# Patient Record
Sex: Female | Born: 1995
Health system: Southern US, Community
[De-identification: ages and names within clinical notes are randomized; demographics above are authoritative.]

## PROBLEM LIST (undated history)

## (undated) ENCOUNTER — Inpatient Hospital Stay (HOSPITAL_COMMUNITY): Payer: Self-pay

## (undated) DIAGNOSIS — Z62820 Parent-biological child conflict: Secondary | ICD-10-CM

## (undated) DIAGNOSIS — B349 Viral infection, unspecified: Secondary | ICD-10-CM

## (undated) DIAGNOSIS — A749 Chlamydial infection, unspecified: Secondary | ICD-10-CM

## (undated) DIAGNOSIS — N390 Urinary tract infection, site not specified: Secondary | ICD-10-CM

## (undated) DIAGNOSIS — J309 Allergic rhinitis, unspecified: Secondary | ICD-10-CM

## (undated) DIAGNOSIS — G43829 Menstrual migraine, not intractable, without status migrainosus: Secondary | ICD-10-CM

## (undated) DIAGNOSIS — F418 Other specified anxiety disorders: Secondary | ICD-10-CM

## (undated) DIAGNOSIS — F331 Major depressive disorder, recurrent, moderate: Secondary | ICD-10-CM

## (undated) DIAGNOSIS — D729 Disorder of white blood cells, unspecified: Secondary | ICD-10-CM

## (undated) DIAGNOSIS — R5383 Other fatigue: Secondary | ICD-10-CM

## (undated) DIAGNOSIS — E559 Vitamin D deficiency, unspecified: Secondary | ICD-10-CM

## (undated) DIAGNOSIS — F320 Major depressive disorder, single episode, mild: Secondary | ICD-10-CM

## (undated) DIAGNOSIS — F419 Anxiety disorder, unspecified: Secondary | ICD-10-CM

## (undated) DIAGNOSIS — D649 Anemia, unspecified: Secondary | ICD-10-CM

## (undated) DIAGNOSIS — J209 Acute bronchitis, unspecified: Secondary | ICD-10-CM

## (undated) DIAGNOSIS — R000 Tachycardia, unspecified: Secondary | ICD-10-CM

## (undated) DIAGNOSIS — R002 Palpitations: Secondary | ICD-10-CM

## (undated) DIAGNOSIS — G4700 Insomnia, unspecified: Secondary | ICD-10-CM

## (undated) DIAGNOSIS — R040 Epistaxis: Secondary | ICD-10-CM

## (undated) DIAGNOSIS — E162 Hypoglycemia, unspecified: Secondary | ICD-10-CM

## (undated) DIAGNOSIS — Z1331 Encounter for screening for depression: Secondary | ICD-10-CM

## (undated) DIAGNOSIS — K589 Irritable bowel syndrome without diarrhea: Secondary | ICD-10-CM

## (undated) DIAGNOSIS — F4001 Agoraphobia with panic disorder: Secondary | ICD-10-CM

## (undated) DIAGNOSIS — T50995A Adverse effect of other drugs, medicaments and biological substances, initial encounter: Secondary | ICD-10-CM

## (undated) DIAGNOSIS — G43909 Migraine, unspecified, not intractable, without status migrainosus: Secondary | ICD-10-CM

## (undated) DIAGNOSIS — J101 Influenza due to other identified influenza virus with other respiratory manifestations: Secondary | ICD-10-CM

## (undated) DIAGNOSIS — K219 Gastro-esophageal reflux disease without esophagitis: Secondary | ICD-10-CM

## (undated) HISTORY — DX: Insomnia, unspecified: G47.00

## (undated) HISTORY — DX: Viral infection, unspecified: B34.9

## (undated) HISTORY — DX: Menstrual migraine, not intractable, without status migrainosus: G43.829

## (undated) HISTORY — DX: Major depressive disorder, single episode, mild: F32.0

## (undated) HISTORY — DX: Urinary tract infection, site not specified: N39.0

## (undated) HISTORY — DX: Parent-biological child conflict: Z62.820

## (undated) HISTORY — DX: Other specified anxiety disorders: F41.8

## (undated) HISTORY — DX: Other fatigue: R53.83

## (undated) HISTORY — DX: Hypoglycemia, unspecified: E16.2

## (undated) HISTORY — DX: Anemia, unspecified: D64.9

## (undated) HISTORY — DX: Acute bronchitis, unspecified: J20.9

## (undated) HISTORY — DX: Disorder of white blood cells, unspecified: D72.9

## (undated) HISTORY — DX: Vitamin D deficiency, unspecified: E55.9

## (undated) HISTORY — DX: Adverse effect of other drugs, medicaments and biological substances, initial encounter: T50.995A

## (undated) HISTORY — DX: Allergic rhinitis, unspecified: J30.9

## (undated) HISTORY — DX: Chlamydial infection, unspecified: A74.9

## (undated) HISTORY — DX: Irritable bowel syndrome, unspecified: K58.9

## (undated) HISTORY — DX: Epistaxis: R04.0

## (undated) HISTORY — DX: Palpitations: R00.2

## (undated) HISTORY — DX: Agoraphobia with panic disorder: F40.01

## (undated) HISTORY — DX: Migraine, unspecified, not intractable, without status migrainosus: G43.909

## (undated) HISTORY — DX: Gastro-esophageal reflux disease without esophagitis: K21.9

## (undated) HISTORY — DX: Major depressive disorder, recurrent, moderate: F33.1

## (undated) HISTORY — PX: EYE SURGERY: SHX253

## (undated) HISTORY — DX: Anxiety disorder, unspecified: F41.9

## (undated) HISTORY — DX: Encounter for screening for depression: Z13.31

## (undated) HISTORY — DX: Influenza due to other identified influenza virus with other respiratory manifestations: J10.1

## (undated) HISTORY — PX: BRAIN SURGERY: SHX531

---
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
## 2014-01-06 ENCOUNTER — Ambulatory Visit: Payer: Self-pay | Admitting: Neurology

## 2014-04-18 ENCOUNTER — Ambulatory Visit: Payer: Self-pay | Admitting: Physician Assistant

## 2014-04-18 LAB — URINALYSIS, COMPLETE
Bacteria: NEGATIVE
Bilirubin,UR: NEGATIVE
Blood: NEGATIVE
Glucose,UR: NEGATIVE mg/dL (ref 0–75)
Ketone: NEGATIVE
Leukocyte Esterase: NEGATIVE
Nitrite: NEGATIVE
Ph: 8 (ref 4.5–8.0)
RBC,UR: NONE SEEN /HPF (ref 0–5)
Specific Gravity: 1.01 (ref 1.003–1.030)
WBC UR: NONE SEEN /HPF (ref 0–5)

## 2014-04-18 LAB — PREGNANCY, URINE: PREGNANCY TEST, URINE: NEGATIVE m[IU]/mL

## 2014-11-11 DIAGNOSIS — A749 Chlamydial infection, unspecified: Secondary | ICD-10-CM

## 2014-11-11 HISTORY — DX: Chlamydial infection, unspecified: A74.9

## 2015-02-20 ENCOUNTER — Other Ambulatory Visit: Payer: Self-pay

## 2015-02-20 DIAGNOSIS — F4001 Agoraphobia with panic disorder: Secondary | ICD-10-CM | POA: Insufficient documentation

## 2015-02-20 DIAGNOSIS — Z111 Encounter for screening for respiratory tuberculosis: Secondary | ICD-10-CM | POA: Insufficient documentation

## 2015-02-20 DIAGNOSIS — F329 Major depressive disorder, single episode, unspecified: Secondary | ICD-10-CM | POA: Insufficient documentation

## 2015-02-20 DIAGNOSIS — J101 Influenza due to other identified influenza virus with other respiratory manifestations: Secondary | ICD-10-CM | POA: Insufficient documentation

## 2015-02-20 DIAGNOSIS — T50995A Adverse effect of other drugs, medicaments and biological substances, initial encounter: Secondary | ICD-10-CM | POA: Insufficient documentation

## 2015-02-20 DIAGNOSIS — Z9229 Personal history of other drug therapy: Secondary | ICD-10-CM | POA: Insufficient documentation

## 2015-02-20 DIAGNOSIS — R040 Epistaxis: Secondary | ICD-10-CM | POA: Insufficient documentation

## 2015-02-20 DIAGNOSIS — R011 Cardiac murmur, unspecified: Secondary | ICD-10-CM | POA: Insufficient documentation

## 2015-02-20 DIAGNOSIS — Z23 Encounter for immunization: Secondary | ICD-10-CM | POA: Insufficient documentation

## 2015-02-20 DIAGNOSIS — G43909 Migraine, unspecified, not intractable, without status migrainosus: Secondary | ICD-10-CM | POA: Insufficient documentation

## 2015-02-20 DIAGNOSIS — Z7189 Other specified counseling: Secondary | ICD-10-CM | POA: Insufficient documentation

## 2015-02-20 DIAGNOSIS — B349 Viral infection, unspecified: Secondary | ICD-10-CM | POA: Insufficient documentation

## 2015-02-20 DIAGNOSIS — R251 Tremor, unspecified: Secondary | ICD-10-CM | POA: Insufficient documentation

## 2015-02-20 DIAGNOSIS — K589 Irritable bowel syndrome without diarrhea: Secondary | ICD-10-CM | POA: Insufficient documentation

## 2015-02-20 DIAGNOSIS — J209 Acute bronchitis, unspecified: Secondary | ICD-10-CM | POA: Insufficient documentation

## 2015-02-20 DIAGNOSIS — Z1331 Encounter for screening for depression: Secondary | ICD-10-CM | POA: Insufficient documentation

## 2015-02-20 DIAGNOSIS — J309 Allergic rhinitis, unspecified: Secondary | ICD-10-CM | POA: Insufficient documentation

## 2015-02-20 DIAGNOSIS — Z0000 Encounter for general adult medical examination without abnormal findings: Secondary | ICD-10-CM | POA: Insufficient documentation

## 2015-02-20 DIAGNOSIS — N390 Urinary tract infection, site not specified: Secondary | ICD-10-CM | POA: Insufficient documentation

## 2015-02-20 DIAGNOSIS — F331 Major depressive disorder, recurrent, moderate: Secondary | ICD-10-CM | POA: Insufficient documentation

## 2015-02-20 DIAGNOSIS — E162 Hypoglycemia, unspecified: Secondary | ICD-10-CM | POA: Insufficient documentation

## 2015-02-20 DIAGNOSIS — K219 Gastro-esophageal reflux disease without esophagitis: Secondary | ICD-10-CM | POA: Insufficient documentation

## 2015-02-20 DIAGNOSIS — G4700 Insomnia, unspecified: Secondary | ICD-10-CM | POA: Insufficient documentation

## 2015-02-20 DIAGNOSIS — R509 Fever, unspecified: Secondary | ICD-10-CM | POA: Insufficient documentation

## 2015-02-20 DIAGNOSIS — Z3009 Encounter for other general counseling and advice on contraception: Secondary | ICD-10-CM | POA: Insufficient documentation

## 2015-02-20 DIAGNOSIS — R5383 Other fatigue: Secondary | ICD-10-CM | POA: Insufficient documentation

## 2015-02-20 DIAGNOSIS — G43829 Menstrual migraine, not intractable, without status migrainosus: Secondary | ICD-10-CM | POA: Insufficient documentation

## 2015-02-20 DIAGNOSIS — Z8679 Personal history of other diseases of the circulatory system: Secondary | ICD-10-CM | POA: Insufficient documentation

## 2015-02-20 DIAGNOSIS — F320 Major depressive disorder, single episode, mild: Secondary | ICD-10-CM | POA: Insufficient documentation

## 2015-02-20 DIAGNOSIS — F32A Depression, unspecified: Secondary | ICD-10-CM | POA: Insufficient documentation

## 2015-02-20 DIAGNOSIS — F419 Anxiety disorder, unspecified: Secondary | ICD-10-CM | POA: Insufficient documentation

## 2015-02-20 DIAGNOSIS — R002 Palpitations: Secondary | ICD-10-CM | POA: Insufficient documentation

## 2015-02-20 DIAGNOSIS — D729 Disorder of white blood cells, unspecified: Secondary | ICD-10-CM | POA: Insufficient documentation

## 2015-04-13 ENCOUNTER — Telehealth: Payer: Self-pay | Admitting: Psychiatry

## 2015-05-26 ENCOUNTER — Encounter: Payer: Self-pay | Admitting: Family Medicine

## 2015-05-26 ENCOUNTER — Ambulatory Visit (INDEPENDENT_AMBULATORY_CARE_PROVIDER_SITE_OTHER): Payer: BLUE CROSS/BLUE SHIELD | Admitting: Family Medicine

## 2015-05-26 ENCOUNTER — Other Ambulatory Visit: Payer: Self-pay

## 2015-05-26 VITALS — BP 108/76 | HR 76 | Temp 97.8°F | Resp 20 | Ht 64.0 in | Wt 189.0 lb

## 2015-05-26 DIAGNOSIS — T50995D Adverse effect of other drugs, medicaments and biological substances, subsequent encounter: Secondary | ICD-10-CM

## 2015-05-26 DIAGNOSIS — F331 Major depressive disorder, recurrent, moderate: Secondary | ICD-10-CM

## 2015-05-26 DIAGNOSIS — H60543 Acute eczematoid otitis externa, bilateral: Secondary | ICD-10-CM | POA: Diagnosis not present

## 2015-05-26 DIAGNOSIS — F419 Anxiety disorder, unspecified: Secondary | ICD-10-CM | POA: Diagnosis not present

## 2015-05-26 MED ORDER — CITALOPRAM HYDROBROMIDE 20 MG PO TABS: 20.0000 mg | ORAL_TABLET | Freq: Every evening | ORAL | Status: DC

## 2015-05-26 MED ORDER — MOMETASONE FUROATE 0.1 % EX CREA: 1.0000 "application " | TOPICAL_CREAM | Freq: Every day | CUTANEOUS | Status: DC

## 2015-05-26 MED ORDER — ALPRAZOLAM 0.25 MG PO TABS: 0.2500 mg | ORAL_TABLET | ORAL | Status: DC | PRN

## 2015-05-26 MED ORDER — EPINEPHRINE 0.3 MG/0.3ML IJ SOAJ
0.3000 mg | Freq: Every day | INTRAMUSCULAR | Status: DC
Start: 2015-05-26 — End: 2016-03-06

## 2015-05-26 NOTE — Progress Notes (Signed)
Subjective:    Patient ID: Lisa Grimes, female    DOB: 02/11/1996, 19 y.o.   MRN: [id number]  Anxiety Presents for follow-up visit. Symptoms include chest pain, confusion, decreased concentration, depressed mood, irritability, malaise, muscle tension, nervous/anxious behavior, palpitations, panic, restlessness and shortness of breath. Patient reports no compulsions, dizziness, dry mouth, excessive worry, feeling of choking, hyperventilation, impotence, insomnia, nausea, obsessions or suicidal ideas. Symptoms occur most days. The severity of symptoms is moderate. The symptoms are aggravated by family issues. The patient sleeps 8 hours per night. The quality of sleep is non-restorative. Nighttime awakenings: one to two.   Past treatments include counseling (CBT) (Pt's copay is currently to high to afford her psychiatrist. Pt is currently taking Xanax, and has run out of Celexa rx.). The treatment provided moderate relief.  Allergic Reaction Associated symptoms include chest pain. Pertinent negatives include no hyperventilation.  Pt needs epi-pen refilled for red-dye allergy.  Patient having mild, chronic itching of the opening of both EACs.   Review of Systems  Constitutional: Positive for irritability.  HENT: Negative.   Eyes: Negative.   Respiratory: Positive for shortness of breath.   Cardiovascular: Positive for chest pain and palpitations.  Gastrointestinal: Negative for nausea.  Endocrine: Negative.   Genitourinary: Negative.  Negative for impotence.  Allergic/Immunologic: Negative.   Neurological: Negative for dizziness.  Hematological: Negative.   Psychiatric/Behavioral: Positive for confusion and decreased concentration. Negative for suicidal ideas. The patient is nervous/anxious. The patient does not have insomnia.    BP 108/76 mmHg  Pulse 76  Temp(Src) 97.8 F (36.6 C) (Oral)  Resp 20  Ht 5\' 4"  (1.626 m)  Wt 189 lb (85.73 kg)  BMI 32.43 kg/m2  LMP 04/28/2015  (Approximate)   Patient Active Problem List   Diagnosis Date Noted  . Abnormal WBC count 02/20/2015  . Allergic reaction to dye 02/20/2015  . Allergic rhinitis 02/20/2015  . Routine general medical examination at a health care facility 02/20/2015  . Anxiety 02/20/2015  . Family planning 02/20/2015  . Acute bronchitis 02/20/2015  . Anxiety and depression 02/20/2015  . Bleeding from the nose 02/20/2015  . Fatigue 02/20/2015  . Fever of undetermined origin 02/20/2015  . Acid reflux 02/20/2015  . Headache, menstrual migraine 02/20/2015  . Cardiac murmur 02/20/2015  . H/O cardiovascular disorder 02/20/2015  . Cannot sleep 02/20/2015  . Depression, major, recurrent, moderate 02/20/2015  . Hypoglycemia 02/20/2015  . Adaptive colitis 02/20/2015  . Influenza due to influenza A virus 02/20/2015  . Received influenza vaccination at hospital 02/20/2015  . Need for vaccination for meningococcus 02/20/2015  . Headache, migraine 02/20/2015  . Mild major depression 02/20/2015  . Need for vaccination 02/20/2015  . Flu vaccine need 02/20/2015  . Intermittent tremor 02/20/2015  . Awareness of heartbeats 02/20/2015  . Disease caused by virus 02/20/2015  . Lower urinary tract infection 02/20/2015  . Screening for depression 02/20/2015  . Screening examination for pulmonary tuberculosis 02/20/2015  . Counseling for guardian-child conflict 02/20/2015  . Panic disorder with agoraphobia and severe panic attacks 02/20/2015   Past Medical History  Diagnosis Date  . Dye allergic reaction   . Rhinitis, allergic   . Abnormal white blood cell count   . Anxiety   . Depression with anxiety   . Epistaxis   . Influenza A   . Gastroesophageal reflux disease   . Screening for depression   . Hypoglycemia   . Insomnia   . Menstrual headache   . Acute bronchitis   .  Palpitations   . Migraine headache   . Mild major depression   . Viral syndrome   . Fatigue   . UTI (lower urinary tract infection)    . Major depressive disorder, recurrent episode, moderate with anxious distress   . Parent-child relational problem   . Panic disorder with agoraphobia and severe panic attacks    Current Outpatient Prescriptions on File Prior to Visit  Medication Sig  . citalopram (CELEXA) 20 MG tablet Take 1 tablet by mouth every evening.  Marland Kitchen EPINEPHrine 0.3 mg/0.3 mL IJ SOAJ injection Inject 1 Device as directed daily.   No current facility-administered medications on file prior to visit.   Allergies  Allergen Reactions  . Penicillins Hives  . Red Dye     possible allergy   No past surgical history on file. History   Social History  . Marital Status: Single    Spouse Name: N/A  . Number of Children: N/A  . Years of Education: N/A   Occupational History  . Not on file.   Social History Main Topics  . Smoking status: Never Smoker   . Smokeless tobacco: Never Used  . Alcohol Use: No  . Drug Use: No  . Sexual Activity: Not on file   Other Topics Concern  . Not on file   Social History Narrative   Family History  Problem Relation Age of Onset  . Depression Mother   . Anxiety disorder Mother   . Alcohol abuse Father   . Bipolar disorder Maternal Aunt   . Bipolar disorder Maternal Uncle   . Bipolar disorder Paternal Uncle   . Depression Maternal Grandmother   . Depression Paternal Grandmother   . Bipolar disorder Paternal Grandmother   . ADD / ADHD Cousin   . Depression Cousin        Objective:   Physical Exam  Constitutional: She is oriented to person, place, and time. She appears well-developed and well-nourished.  HENT:  Head: Normocephalic and atraumatic.  Right Ear: External ear normal.  Left Ear: External ear normal.  Nose: Nose normal.  Eyes: Conjunctivae are normal. Pupils are equal, round, and reactive to light.  Neck: Normal range of motion. Neck supple.  Cardiovascular: Normal rate, regular rhythm and normal heart sounds.   Pulmonary/Chest: Effort normal and  breath sounds normal.  Abdominal: Soft. Bowel sounds are normal.  Neurological: She is alert and oriented to person, place, and time.  Skin: Skin is warm and dry.  Psychiatric: She has a normal mood and affect. Her behavior is normal. Judgment and thought content normal.          Assessment & Plan:  1. Chronic anxiety Advised patient that I think the citalopram is more important to her than the alprazolam. Restarted this. Alprazolam when necessary. She will follow up with a different psychiatrist. I think this is important that do think she has chronic anxiety with some mild depression. 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  2. Chronic allergies EpiPen is refilled. 3 mild eczema of both external auditory canals. We'll treat with mometasone cream nightly for ran 2 with a couple refills. I need referral but I think this will be fine. 4. Chlamydia cervicitis found last year routine GYN exam. I commended her routine GYN follow-up along with contraception through GYN.  I have done the exam and reviewed the above chart and it is accurate to the best of my knowledge.

## 2015-07-24 ENCOUNTER — Ambulatory Visit (INDEPENDENT_AMBULATORY_CARE_PROVIDER_SITE_OTHER): Payer: BLUE CROSS/BLUE SHIELD | Admitting: Family Medicine

## 2015-07-24 ENCOUNTER — Encounter: Payer: Self-pay | Admitting: Family Medicine

## 2015-07-24 VITALS — BP 110/68 | HR 72 | Temp 98.1°F | Resp 16 | Wt 186.0 lb

## 2015-07-24 DIAGNOSIS — L03818 Cellulitis of other sites: Secondary | ICD-10-CM

## 2015-07-24 DIAGNOSIS — S93402A Sprain of unspecified ligament of left ankle, initial encounter: Secondary | ICD-10-CM

## 2015-07-24 DIAGNOSIS — L600 Ingrowing nail: Secondary | ICD-10-CM | POA: Diagnosis not present

## 2015-07-24 MED ORDER — SULFAMETHOXAZOLE-TRIMETHOPRIM 800-160 MG PO TABS: 1.0000 | ORAL_TABLET | Freq: Two times a day (BID) | ORAL | Status: DC

## 2015-07-24 NOTE — Progress Notes (Signed)
Patient ID: Lisa Grimes, female   DOB: 1996/09/17, 19 y.o.   MRN: [id number]    Subjective:  HPI   Ingrown toenail issue: Patient has a history of this that is recurrent but not as severe as she has it now. She usually will apply peroxide and try to cut the nail out but not this time.  Ankle/foot pain: Patient was walking and stepped in a hole in the ground and twisted her left foot outward. Slight swelling present and pain.  Prior to Admission medications   Medication Sig Start Date End Date Taking? Authorizing Provider  ALPRAZolam (XANAX) 0.25 MG tablet Take 1 tablet (0.25 mg total) by mouth as needed. 05/26/15  Yes Richard Hulen Shouts., MD  citalopram (CELEXA) 20 MG tablet Take 1 tablet (20 mg total) by mouth every evening. 05/26/15  Yes Richard Hulen Shouts., MD  EPINEPHrine 0.3 mg/0.3 mL IJ SOAJ injection Inject 0.3 mLs (0.3 mg total) into the muscle daily. 05/26/15  Yes Richard Hulen Shouts., MD  mometasone (ELOCON) 0.1 % cream Apply 1 application topically daily. Apply 1 dab at bedtime for 2 weeks 05/26/15  Yes Richard Hulen Shouts., MD    Patient Active Problem List   Diagnosis Date Noted  . Abnormal WBC count 02/20/2015  . Allergic reaction to dye 02/20/2015  . Allergic rhinitis 02/20/2015  . Routine general medical examination at a health care facility 02/20/2015  . Anxiety 02/20/2015  . Family planning 02/20/2015  . Acute bronchitis 02/20/2015  . Anxiety and depression 02/20/2015  . Bleeding from the nose 02/20/2015  . Fatigue 02/20/2015  . Fever of undetermined origin 02/20/2015  . Acid reflux 02/20/2015  . Headache, menstrual migraine 02/20/2015  . Cardiac murmur 02/20/2015  . H/O cardiovascular disorder 02/20/2015  . Cannot sleep 02/20/2015  . Depression, major, recurrent, moderate 02/20/2015  . Hypoglycemia 02/20/2015  . Adaptive colitis 02/20/2015  . Influenza due to influenza A virus 02/20/2015  . Received influenza vaccination at hospital 02/20/2015  . Need  for vaccination for meningococcus 02/20/2015  . Headache, migraine 02/20/2015  . Mild major depression 02/20/2015  . Need for vaccination 02/20/2015  . Flu vaccine need 02/20/2015  . Intermittent tremor 02/20/2015  . Awareness of heartbeats 02/20/2015  . Disease caused by virus 02/20/2015  . Lower urinary tract infection 02/20/2015  . Screening for depression 02/20/2015  . Screening examination for pulmonary tuberculosis 02/20/2015  . Counseling for guardian-child conflict 02/20/2015  . Panic disorder with agoraphobia and severe panic attacks 02/20/2015    Past Medical History  Diagnosis Date  . Dye allergic reaction   . Rhinitis, allergic   . Abnormal white blood cell count   . Anxiety   . Depression with anxiety   . Epistaxis   . Influenza A   . Gastroesophageal reflux disease   . Screening for depression   . Hypoglycemia   . Insomnia   . Menstrual headache   . Acute bronchitis   . Palpitations   . Migraine headache   . Mild major depression   . Viral syndrome   . Fatigue   . UTI (lower urinary tract infection)   . Major depressive disorder, recurrent episode, moderate with anxious distress   . Parent-child relational problem   . Panic disorder with agoraphobia and severe panic attacks     Social History   Social History  . Marital Status: Single    Spouse Name: N/A  . Number of Children: N/A  . Years of  Education: N/A   Occupational History  . Not on file.   Social History Main Topics  . Smoking status: Never Smoker   . Smokeless tobacco: Never Used  . Alcohol Use: No  . Drug Use: No  . Sexual Activity: Not on file   Other Topics Concern  . Not on file   Social History Narrative    Allergies  Allergen Reactions  . Penicillins Hives  . Red Dye     possible allergy    Review of Systems  Constitutional: Negative.   HENT: Negative.   Eyes: Negative.   Respiratory: Negative.   Cardiovascular: Negative.   Gastrointestinal: Negative.     Musculoskeletal: Positive for joint pain.  Skin: Negative.   Neurological: Negative.   Psychiatric/Behavioral: Negative.      There is no immunization history on file for this patient. Objective:  BP 110/68 mmHg  Pulse 72  Temp(Src) 98.1 F (36.7 C)  Resp 16  Wt 186 lb (84.369 kg)  Physical Exam  Constitutional: She is oriented to person, place, and time and well-developed, well-nourished, and in no distress.  HENT:  Head: Normocephalic and atraumatic.  Right Ear: External ear normal.  Left Ear: External ear normal.  Nose: Nose normal.  Eyes: Conjunctivae are normal.  Neck: Neck supple.  Cardiovascular: Normal rate, regular rhythm and normal heart sounds.   Pulmonary/Chest: Effort normal and breath sounds normal.  Abdominal: Soft.  Musculoskeletal:  Mild swelling of left lateral ankle/laterlforefoot. Ankle/foot stable with minimal tenderness.  Neurological: She is alert and oriented to person, place, and time.  Skin: Skin is warm and dry.  Psychiatric: Mood, memory, affect and judgment normal.    Lab Results  Component Value Date   WBC 6.0 12/23/2013   HGB 13.1 12/23/2013   HCT 39.2 12/23/2013   PLT 152 12/23/2013   GLUCOSE 126* 12/23/2013    CMP     Component Value Date/Time   NA 138 12/23/2013 2222   K 3.7 12/23/2013 2222   CL 106 12/23/2013 2222   CO2 28* 12/23/2013 2222   GLUCOSE 126* 12/23/2013 2222   BUN 11 12/23/2013 2222   CREATININE 0.74 12/23/2013 2222   CALCIUM 8.8* 12/23/2013 2222   GFRNONAA >60 12/23/2013 2222   GFRAA >60 12/23/2013 2222    Assessment and Plan :  1. Ingrown toenail  Refer to podiatry at pt request of Antony Contras here at [facility] can remove nail when no longer infected. - sulfamethoxazole-trimethoprim (BACTRIM DS,SEPTRA DS) 800-160 MG per tablet; Take 1 tablet by mouth 2 (two) times daily.  Dispense: 14 tablet; Refill: 0  2. Cellulitis of other specified site   3. Ankle sprain, left, initial encounter 1st degree. No xray needed  unless pain increase. Minimal swelling today and normal exam. Note written for work for pt.RICE discussedn at length. Happy to refer to podiatry. Does not need boot or to avoid all weight bearing. 4.Chronic Anxiety  Julieanne Manson MD [facility] 07/24/2015 1:41 PM

## 2015-07-24 NOTE — Patient Instructions (Signed)
Use Ibuprofen 400 mg three times daily as needed for ankle pain and swelling. Apply ice four times daily as needed. Elevate foot while icing. Use an ace wrap as instructed for 1 week.

## 2015-08-02 ENCOUNTER — Encounter: Payer: Self-pay | Admitting: Physician Assistant

## 2015-08-02 ENCOUNTER — Other Ambulatory Visit: Payer: Self-pay

## 2015-08-02 ENCOUNTER — Ambulatory Visit (INDEPENDENT_AMBULATORY_CARE_PROVIDER_SITE_OTHER): Payer: BLUE CROSS/BLUE SHIELD | Admitting: Physician Assistant

## 2015-08-02 VITALS — BP 112/60 | HR 70 | Temp 98.1°F | Resp 16 | Wt 185.8 lb

## 2015-08-02 DIAGNOSIS — L600 Ingrowing nail: Secondary | ICD-10-CM

## 2015-08-02 MED ORDER — SULFAMETHOXAZOLE-TRIMETHOPRIM 800-160 MG PO TABS: 1.0000 | ORAL_TABLET | Freq: Two times a day (BID) | ORAL | Status: DC

## 2015-08-02 NOTE — Progress Notes (Signed)
   Subjective:    Patient ID: Lisa Grimes, female    DOB: 10/12/1996, 19 y.o.   MRN: [id number]  HPI Lisa Grimes is a 19 year old female that comes to the office today for removal of an ingrown toenail. She was previously seen last week by Dr. Sullivan Lone and was started on Bactrim for the ingrown toenail infection. She states that it has improved some but it is still tender. She is still having issues and pain with walking. 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 This causes her pain and increases the risk for further infections. She has tolerated the antibiotic well. There is still some redness to the left great toe lateral aspect. There is no drainage today. She denies any fevers, chills, nausea or vomiting.   Review of Systems  Constitutional: Negative.   Respiratory: Negative.   Cardiovascular: Negative.   Gastrointestinal: Negative.   Musculoskeletal: Negative.   Skin: Positive for wound (infected L great toe ingrown nail).  Neurological: Negative.   Hematological: Negative.        Objective:   Physical Exam  Constitutional: She appears well-developed and well-nourished. No distress.  Skin: Skin is warm and dry. No rash noted. She is not diaphoretic. There is erythema (L great toe lateral aspect). No cyanosis. Nails show no clubbing.     Vitals reviewed.         Assessment & Plan:  1. Ingrown toenail The lateral aspect of the left great toenail was removed through the nail bed today in the office. I will give her Bactrim as below to give her a total of 5 days posttreatment antibiotic therapy. I gave her handout for aftercare for the toe. She is to call the office if she develops any more redness, swelling, pain, fever, chills, nausea or vomiting. - sulfamethoxazole-trimethoprim (BACTRIM DS,SEPTRA DS) 800-160 MG per tablet; Take 1 tablet by mouth 2 (two) times daily.  Dispense: 6 tablet; Refill: 0  Procedure Note: The above  procedure, risks (including infection, allergic reaction and bleeding), benefits and alternatives were explained to the patient who voiced understanding of the information given.  Questions were sought and answered prior to beginning.  The patient agreed to proceed.  Verbal consent was obtained.  The area was then prepped and draped in a sterile fashion.A digital block was then performed on the left great toe using 10cc 2% xylocaine with epinephrine. Forceps were then used to lift the nail from the nail bed and lift the nail edge out from under the overlying skin on the lateral aspect of the left great toe.  Scissors were then used to cut the edge of the nail to be removed down to the cuticle.  A hemostat was then used to remove this portion of the nail.  The lateral nail was inspected to make sure all of the nail had been removed.  Bleeding was then controlled with silver nitrate.  Vaseline gauze was placed over the now open nail bed.  The left great toe was then dressed with dry gauze and secured in place.  There were no complications.  Blood loss was minimal.  The patient was alert and oriented.  She tolerated the procedure well.  Post procedure instructions and wound care were given as a handout to the patient.

## 2015-08-02 NOTE — Patient Instructions (Signed)
Toenail Removal °Toenails may need to be removed because of injury, infections, or to correct abnormal growth. A special non-stick bandage will likely be put tightly on your toe to prevent bleeding. Often times a new nail will grow back. Sometimes the new nail may be deformed. Most of the time when a nail is lost, it will gradually heal, but may be sensitive for a long time. °HOME CARE INSTRUCTIONS  °· Keep your foot elevated to relieve pain and swelling. This will require lying in bed or on a couch with the leg on pillows or sitting in a recliner with the leg up. Walking or letting your leg dangle may increase swelling, slow healing, and cause throbbing pain. °· Keep your bandage dry and clean. °· Change your bandage in 24 hours. °· After your bandage is changed, soak your foot in warm, soapy water for 10 to 20 minutes. Do this 3 times per day. This helps reduce pain and swelling. After soaking your foot apply a clean, dry bandage. Change your bandage if it is wet or dirty. °· Only take over-the-counter or prescription medicines for pain, discomfort, or fever as directed by your caregiver. °· See your caregiver as needed for problems. °You might need a tetanus shot now if: °· You have no idea when you had the last one. °· You have never had a tetanus shot before. °· The injured area had dirt in it. °If you need a tetanus shot, and you decide not to get one, there is a rare chance of getting tetanus. Sickness from tetanus can be serious. If you did get a tetanus shot, your arm may swell, get red and warm to the touch at the shot site. This is common and not a problem. °SEEK IMMEDIATE MEDICAL CARE IF:  °· You have increased pain, swelling, redness, warmth, drainage, or bleeding. °· You have a fever. °· You have swelling that spreads from your toe into your foot. °Document Released: 07/27/2003 Document Revised: 01/20/2012 Document Reviewed: 11/07/2008 °[employer]® Patient Information ©2015 [employer]. This  information is not intended to replace advice given to you by your health care provider. Make sure you discuss any questions you have with your health care provider. ° °Ingrown Toenail °An ingrown toenail occurs when the sharp edge of your toenail grows into the skin. Causes of ingrown toenails include toenails clipped too far back or poorly fitting shoes. Activities involving sudden stops (basketball, tennis) causing "toe jamming" may lead to an ingrown nail. °HOME CARE INSTRUCTIONS  °· Soak the whole foot in warm soapy water for 20 minutes, 3 times per day. °· You may lift the edge of the nail away from the sore skin by wedging a small piece of cotton under the corner of the nail. Be careful not to dig (traumatize) and cause more injury to the area. °· Wear shoes that fit well. While the ingrown nail is causing problems, sandals may be beneficial. °· Trim your toenails regularly and carefully. Cut your toenails straight across, not in a curve. This will prevent injury to the skin at the corners of the toenail. °· Keep your feet clean and dry. °· Crutches may be helpful early in treatment if walking is painful. °· Antibiotics, if prescribed, should be taken as directed. °· Return for a wound check in 2 days or as directed. °· Only take over-the-counter or prescription medicines for pain, discomfort, or fever as directed by your caregiver. °SEEK IMMEDIATE MEDICAL CARE IF:  °· You have a fever. °·   You have increasing pain, redness, swelling, or heat at the wound site.  Your toe is not better in 7 days. If conservative treatment is not successful, surgical removal of a portion or all of the nail may be necessary. MAKE SURE YOU:   Understand these instructions.  Will watch your condition.  Will get help right away if you are not doing well or get worse. Document Released: 10/25/2000 Document Revised: 01/20/2012 Document Reviewed: 10/19/2008 [facility] Patient Information 2015 [place], [place]. This information  is not intended to replace advice given to you by your health care provider. Make sure you discuss any questions you have with your health care provider.

## 2015-08-17 ENCOUNTER — Emergency Department (HOSPITAL_COMMUNITY): Payer: BLUE CROSS/BLUE SHIELD

## 2015-08-17 ENCOUNTER — Emergency Department (HOSPITAL_COMMUNITY)
Admission: EM | Admit: 2015-08-17 | Discharge: 2015-08-17 | Disposition: A | Discharge status: Home / Self Care | Payer: BLUE CROSS/BLUE SHIELD | Attending: Emergency Medicine | Admitting: Emergency Medicine

## 2015-08-17 ENCOUNTER — Encounter (HOSPITAL_COMMUNITY): Payer: Self-pay | Admitting: Emergency Medicine

## 2015-08-17 DIAGNOSIS — G43909 Migraine, unspecified, not intractable, without status migrainosus: Secondary | ICD-10-CM | POA: Diagnosis not present

## 2015-08-17 DIAGNOSIS — Z79899 Other long term (current) drug therapy: Secondary | ICD-10-CM | POA: Diagnosis not present

## 2015-08-17 DIAGNOSIS — R002 Palpitations: Secondary | ICD-10-CM

## 2015-08-17 DIAGNOSIS — K219 Gastro-esophageal reflux disease without esophagitis: Secondary | ICD-10-CM | POA: Diagnosis not present

## 2015-08-17 DIAGNOSIS — F329 Major depressive disorder, single episode, unspecified: Secondary | ICD-10-CM | POA: Diagnosis not present

## 2015-08-17 DIAGNOSIS — Z880 Allergy status to penicillin: Secondary | ICD-10-CM | POA: Insufficient documentation

## 2015-08-17 DIAGNOSIS — R079 Chest pain, unspecified: Secondary | ICD-10-CM | POA: Diagnosis present

## 2015-08-17 DIAGNOSIS — F419 Anxiety disorder, unspecified: Secondary | ICD-10-CM | POA: Insufficient documentation

## 2015-08-17 DIAGNOSIS — G4700 Insomnia, unspecified: Secondary | ICD-10-CM | POA: Insufficient documentation

## 2015-08-17 DIAGNOSIS — Z87440 Personal history of urinary (tract) infections: Secondary | ICD-10-CM | POA: Insufficient documentation

## 2015-08-17 LAB — COMPREHENSIVE METABOLIC PANEL
ALT: 14 U/L (ref 14–54)
AST: 24 U/L (ref 15–41)
Albumin: 4 g/dL (ref 3.5–5.0)
Alkaline Phosphatase: 84 U/L (ref 38–126)
Anion gap: 9 (ref 5–15)
BUN: 9 mg/dL (ref 6–20)
CO2: 26 mmol/L (ref 22–32)
Calcium: 9.5 mg/dL (ref 8.9–10.3)
Chloride: 102 mmol/L (ref 101–111)
Creatinine, Ser: 0.98 mg/dL (ref 0.44–1.00)
GFR calc Af Amer: >60 mL/min (ref >60)
GFR calc non Af Amer: >60 mL/min (ref >60)
Glucose, Bld: 96 mg/dL (ref 65–99)
Potassium: 3.6 mmol/L (ref 3.5–5.1)
Sodium: 137 mmol/L (ref 135–145)
Total Bilirubin: 0.5 mg/dL (ref 0.3–1.2)
Total Protein: 7.5 g/dL (ref 6.5–8.1)

## 2015-08-17 LAB — CBC WITH DIFFERENTIAL/PLATELET
Basophils Absolute: 0 10*3/uL (ref 0.0–0.1)
Basophils Relative: 0 %
Eosinophils Absolute: 0 10*3/uL (ref 0.0–0.7)
Eosinophils Relative: 0 %
HCT: 40.8 % (ref 36.0–46.0)
Hemoglobin: 12.9 g/dL (ref 12.0–15.0)
Lymphocytes Relative: 20 %
Lymphs Abs: 1.8 10*3/uL (ref 0.7–4.0)
MCH: 25.4 pg — ABNORMAL LOW (ref 26.0–34.0)
MCHC: 31.6 g/dL (ref 30.0–36.0)
MCV: 80.5 fL (ref 78.0–100.0)
Monocytes Absolute: 0.4 10*3/uL (ref 0.1–1.0)
Monocytes Relative: 5 %
Neutro Abs: 6.8 10*3/uL (ref 1.7–7.7)
Neutrophils Relative %: 75 %
Platelets: 216 10*3/uL (ref 150–400)
RBC: 5.07 MIL/uL (ref 3.87–5.11)
RDW: 13.8 % (ref 11.5–15.5)
WBC: 9.1 10*3/uL (ref 4.0–10.5)

## 2015-08-17 LAB — I-STAT CHEM 8, ED
BUN: 11 mg/dL (ref 6–20)
CREATININE: 1 mg/dL (ref 0.44–1.00)
Calcium, Ion: 1.19 mmol/L (ref 1.12–1.23)
Chloride: 101 mmol/L (ref 101–111)
GLUCOSE: 91 mg/dL (ref 65–99)
HEMATOCRIT: 44 % (ref 36.0–46.0)
HEMOGLOBIN: 15 g/dL (ref 12.0–15.0)
POTASSIUM: 3.6 mmol/L (ref 3.5–5.1)
Sodium: 139 mmol/L (ref 135–145)
TCO2: 24 mmol/L (ref 0–100)

## 2015-08-17 LAB — I-STAT TROPONIN, ED: Troponin i, poc: 0 ng/mL (ref 0.00–0.08)

## 2015-08-17 MED ORDER — KETOROLAC TROMETHAMINE 30 MG/ML IJ SOLN: 30.0000 mg | Freq: Once | INTRAMUSCULAR | Status: DC

## 2015-08-17 MED ORDER — METHOCARBAMOL 500 MG PO TABS: 500.0000 mg | ORAL_TABLET | Freq: Two times a day (BID) | ORAL | Status: DC

## 2015-08-17 MED ORDER — KETOROLAC TROMETHAMINE 60 MG/2ML IM SOLN
60.0000 mg | Freq: Once | INTRAMUSCULAR | Status: AC
  Administered 2015-08-17: 60 mg via INTRAMUSCULAR
  Filled 2015-08-17: qty 2

## 2015-08-17 NOTE — ED Notes (Signed)
Patient reports she was at work when she had sudden onset of chest pain and feeling like her heart was racing. Patient states her heart was in the 120s. Patient reports she still has mild discomfort in her chest but states like her heart feels normal now.

## 2015-08-17 NOTE — ED Provider Notes (Signed)
CSN: [id number]     Arrival date & time 08/17/15  1706 History   First MD Initiated Contact with Patient 08/17/15 1907     Chief Complaint  Patient presents with  . Chest Pain     (Consider location/radiation/quality/duration/timing/severity/associated sxs/prior Treatment) HPI Comments: Patient here with greater than 10+ years of chest discomfort that has been diagnosed as anxiety. Patient feels that her heart is racing. Also diagnosed as having costochondritis. Denies any leg pain or swelling. No recent immobility. No significant or syncope. Patient states that she took her heart rate and it was in the 120s. She has been prescribed Xanax for this in the past. She denies feeling anxious at this time. No recent blood loss. She does not take any blood thinners or birth control pills. Symptoms improved on their own and nothing seems to make him worse. Symptoms today started after she ate lunch denies any excessive caffeine use  The history is provided by the patient.    Past Medical History  Diagnosis Date  . Dye allergic reaction   . Rhinitis, allergic   . Abnormal white blood cell count   . Anxiety   . Depression with anxiety   . Epistaxis   . Influenza A   . Gastroesophageal reflux disease   . Screening for depression   . Hypoglycemia   . Insomnia   . Menstrual headache   . Acute bronchitis   . Palpitations   . Migraine headache   . Mild major depression (HCC)   . Viral syndrome   . Fatigue   . UTI (lower urinary tract infection)   . Major depressive disorder, recurrent episode, moderate with anxious distress (HCC)   . Parent-child relational problem   . Panic disorder with agoraphobia and severe panic attacks    History reviewed. No pertinent past surgical history. Family History  Problem Relation Age of Onset  . Depression Mother   . Anxiety disorder Mother   . Alcohol abuse Father   . Bipolar disorder Maternal Aunt   . Bipolar disorder Maternal Uncle   . Bipolar  disorder Paternal Uncle   . Depression Maternal Grandmother   . Depression Paternal Grandmother   . Bipolar disorder Paternal Grandmother   . ADD / ADHD Cousin   . Depression Cousin    Social History  Substance Use Topics  . Smoking status: Never Smoker   . Smokeless tobacco: Never Used  . Alcohol Use: No   OB History    No data available     Review of Systems  All other systems reviewed and are negative.     Allergies  Penicillins and Red dye  Home Medications   Prior to Admission medications   Medication Sig Start Date End Date Taking? Authorizing Provider  ALPRAZolam (XANAX) 0.25 MG tablet Take 1 tablet (0.25 mg total) by mouth as needed. 05/26/15   Richard Hulen Shouts., MD  citalopram (CELEXA) 20 MG tablet Take 1 tablet (20 mg total) by mouth every evening. 05/26/15   Richard Hulen Shouts., MD  EPINEPHrine 0.3 mg/0.3 mL IJ SOAJ injection Inject 0.3 mLs (0.3 mg total) into the muscle daily. 05/26/15   Maple Hudson., MD  fluconazole (DIFLUCAN) 150 MG tablet  05/29/15   Historical Provider, MD  mometasone (ELOCON) 0.1 % cream Apply 1 application topically daily. Apply 1 dab at bedtime for 2 weeks 05/26/15   Maple Hudson., MD  sulfamethoxazole-trimethoprim (BACTRIM DS,SEPTRA DS) 800-160 MG per tablet Take 1  tablet by mouth 2 (two) times daily. 08/02/15   Margaretann Loveless, PA-C   BP 138/79 mmHg  Pulse 90  Temp(Src) 99.1 F (37.3 C) (Oral)  Resp 18  Ht  (1.626 m)  Wt 185 lb (83.915 kg)  BMI 31.74 kg/m2  SpO2 99%  LMP 07/22/2015 Physical Exam  Constitutional: She is oriented to person, place, and time. She appears well-developed and well-nourished.  Non-toxic appearance. No distress.  HENT:  Head: Normocephalic and atraumatic.  Eyes: Conjunctivae, EOM and lids are normal. Pupils are equal, round, and reactive to light.  Neck: Normal range of motion. Neck supple. No tracheal deviation present. No thyroid mass present.  Cardiovascular: Normal rate,  regular rhythm and normal heart sounds.  Exam reveals no gallop.   No murmur heard. Pulmonary/Chest: Effort normal and breath sounds normal. No stridor. No respiratory distress. She has no decreased breath sounds. She has no wheezes. She has no rhonchi. She has no rales. She exhibits tenderness and bony tenderness.    Abdominal: Soft. Normal appearance and bowel sounds are normal. She exhibits no distension. There is no tenderness. There is no rebound and no CVA tenderness.  Musculoskeletal: Normal range of motion. She exhibits no edema or tenderness.  Neurological: She is alert and oriented to person, place, and time. She has normal strength. No cranial nerve deficit or sensory deficit. GCS eye subscore is 4. GCS verbal subscore is 5. GCS motor subscore is 6.  Skin: Skin is warm and dry. No abrasion and no rash noted.  Psychiatric: She has a normal mood and affect. Her speech is normal and behavior is normal.  Nursing note and vitals reviewed.   ED Course  Procedures (including critical care time) Labs Review Labs Reviewed  CBC WITH DIFFERENTIAL/PLATELET - Abnormal; Notable for the following:    MCH 25.4 (*)    All other components within normal limits  COMPREHENSIVE METABOLIC PANEL  I-STAT TROPOININ, ED  I-STAT CHEM 8, ED    Imaging Review Dg Chest 2 View  08/17/2015   CLINICAL DATA:  Chest pain  EXAM: CHEST  2 VIEW  COMPARISON:  None.  FINDINGS: The heart size and mediastinal contours are within normal limits. Both lungs are clear. The visualized skeletal structures are unremarkable.  IMPRESSION: No active cardiopulmonary disease.   Electronically Signed   By: Jolaine Click M.D.   On: 08/17/2015 18:07   I have personally reviewed and evaluated these images and lab results as part of my medical decision-making.   EKG Interpretation   Date/Time:  Thursday August 17 2015 17:14:16 EDT Ventricular Rate:  98 PR Interval:  130 QRS Duration: 66 QT Interval:  376 QTC Calculation:  480 R Axis:   75 Text Interpretation:  Normal sinus rhythm Possible Left atrial enlargement  Nonspecific T wave abnormality Abnormal ECG No old tracing to compare  Confirmed by KOHUT  MD, STEPHEN ([id number]) on 08/17/2015 7:09:20 PM      MDM   Final diagnoses:  None    Patient has no leg pain or swelling. Do not think that this represents ACS or PE. Patient states that she has had a Holter monitor placed in the past but stopped working so there was never report. I will give patient referral to cardiology on-call. Blood pressure stable. Return precautions given    Lorre Nick, MD 08/17/15 1942

## 2015-08-17 NOTE — ED Notes (Signed)
Pt to ED with c/o central chest pain onset approx 3pm today.  St's she had just finished eating lunch.  Denies any nausea, vomiting, shortness of breath or diaphoresis

## 2015-08-17 NOTE — Discharge Instructions (Signed)

## 2015-08-19 ENCOUNTER — Emergency Department
Admission: EM | Admit: 2015-08-19 | Discharge: 2015-08-19 | Disposition: A | Discharge status: Home / Self Care | Payer: BLUE CROSS/BLUE SHIELD | Attending: Emergency Medicine | Admitting: Emergency Medicine

## 2015-08-19 ENCOUNTER — Emergency Department: Payer: BLUE CROSS/BLUE SHIELD

## 2015-08-19 ENCOUNTER — Encounter: Payer: Self-pay | Admitting: *Deleted

## 2015-08-19 DIAGNOSIS — Z880 Allergy status to penicillin: Secondary | ICD-10-CM | POA: Insufficient documentation

## 2015-08-19 DIAGNOSIS — R000 Tachycardia, unspecified: Secondary | ICD-10-CM | POA: Diagnosis not present

## 2015-08-19 DIAGNOSIS — F419 Anxiety disorder, unspecified: Secondary | ICD-10-CM | POA: Diagnosis not present

## 2015-08-19 DIAGNOSIS — G8929 Other chronic pain: Secondary | ICD-10-CM | POA: Insufficient documentation

## 2015-08-19 DIAGNOSIS — Z79899 Other long term (current) drug therapy: Secondary | ICD-10-CM | POA: Insufficient documentation

## 2015-08-19 DIAGNOSIS — R0602 Shortness of breath: Secondary | ICD-10-CM | POA: Diagnosis not present

## 2015-08-19 DIAGNOSIS — R079 Chest pain, unspecified: Secondary | ICD-10-CM | POA: Diagnosis not present

## 2015-08-19 DIAGNOSIS — R002 Palpitations: Secondary | ICD-10-CM | POA: Diagnosis not present

## 2015-08-19 DIAGNOSIS — E669 Obesity, unspecified: Secondary | ICD-10-CM | POA: Diagnosis not present

## 2015-08-19 LAB — CBC
HCT: 40.8 % (ref 35.0–47.0)
Hemoglobin: 13 g/dL (ref 12.0–16.0)
MCH: 25 pg — ABNORMAL LOW (ref 26.0–34.0)
MCHC: 32 g/dL (ref 32.0–36.0)
MCV: 78.1 fL — ABNORMAL LOW (ref 80.0–100.0)
PLATELETS: 196 10*3/uL (ref 150–440)
RBC: 5.22 MIL/uL — AB (ref 3.80–5.20)
RDW: 15 % — ABNORMAL HIGH (ref 11.5–14.5)
WBC: 6.6 10*3/uL (ref 3.6–11.0)

## 2015-08-19 LAB — BASIC METABOLIC PANEL
ANION GAP: 6 (ref 5–15)
BUN: 9 mg/dL (ref 6–20)
CALCIUM: 9.4 mg/dL (ref 8.9–10.3)
CO2: 28 mmol/L (ref 22–32)
Chloride: 104 mmol/L (ref 101–111)
Creatinine, Ser: 0.66 mg/dL (ref 0.44–1.00)
GFR calc Af Amer: >60 mL/min (ref >60)
Glucose, Bld: 89 mg/dL (ref 65–99)
POTASSIUM: 3.7 mmol/L (ref 3.5–5.1)
SODIUM: 138 mmol/L (ref 135–145)

## 2015-08-19 LAB — TSH: TSH: 2.204 u[IU]/mL (ref 0.350–4.500)

## 2015-08-19 LAB — TROPONIN I

## 2015-08-19 LAB — FIBRIN DERIVATIVES D-DIMER (ARMC ONLY): FIBRIN DERIVATIVES D-DIMER (ARMC): 192 (ref 0–499)

## 2015-08-19 MED ORDER — GI COCKTAIL ~~LOC~~
30.0000 mL | Freq: Once | ORAL | Status: DC
  Filled 2015-08-19: qty 30

## 2015-08-19 NOTE — Discharge Instructions (Signed)
Please make a follow up appointment with your primary care for physician for follow-up. Please have your doctor follow up the results of your thyroid tests from today. Please also keep your appointment with a cardiologist for further evaluation of your symptoms.  Please return to the emergency department if you develop chest pain, shortness breath, fainting, high heart rate, lightheadedness or shortness of breath, or any other symptoms concerning to you.

## 2015-08-19 NOTE — ED Provider Notes (Signed)
[facility] Emergency Department Provider Note  ____________________________________________  Time seen: Approximately 4:16 PM  I have reviewed the triage vital signs and the nursing notes.   HISTORY  Chief Complaint Chest Pain and Palpitations     HPI Lisa Grimes is a 19 y.o. female with a history of chronic chest pain, anxiety, presenting with chest pain. The patient reports that she has had intermittent chest pain with palpitations associated with anxiety since she was 19 years old. However, she reports 2 episodes this week that were worse than usual. This morning, she describes that she awoke with a heart rate of 112 and palpitations, associated with central "dull" and tight" chest pain. It did not radiate, it might have been associated with shortness of breath. No diaphoresis or nausea or vomiting. She did not feel anxious when the symptoms started, but became anxious because of the symptoms. Occasionally she has associated headaches, but that did not happen this morning. She does similar episode 2 days ago for which she was seen at [facility] with reassuring workup and discharge home with cardiology follow-up in 3 weeks. At this time, the patient states that the chest pain from this morning is gone, but she continues to have a "sore" feeling.  She denies any recent illness, tobacco or cocaine use, changes in medications, syncope, fever, chills, nausea or vomiting. LMP was 3 weeks ago. He denies any personal or family history of blood clots. No family history of early CAD or sudden cardiac death, but she does have an uncle who had a pacemaker placed at an early age for unknown disease. She does not take OCPs and has not had any long travel.   Past Medical History  Diagnosis Date  . Dye allergic reaction   . Rhinitis, allergic   . Abnormal white blood cell count   . Anxiety   . Depression with anxiety   . Epistaxis   . Influenza A   . Gastroesophageal reflux  disease   . Screening for depression   . Hypoglycemia   . Insomnia   . Menstrual headache   . Acute bronchitis   . Palpitations   . Migraine headache   . Mild major depression (HCC)   . Viral syndrome   . Fatigue   . UTI (lower urinary tract infection)   . Major depressive disorder, recurrent episode, moderate with anxious distress (HCC)   . Parent-child relational problem   . Panic disorder with agoraphobia and severe panic attacks     Patient Active Problem List   Diagnosis Date Noted  . Abnormal WBC count 02/20/2015  . Allergic reaction to dye 02/20/2015  . Allergic rhinitis 02/20/2015  . Routine general medical examination at a health care facility 02/20/2015  . Anxiety 02/20/2015  . Family planning 02/20/2015  . Acute bronchitis 02/20/2015  . Anxiety and depression 02/20/2015  . Bleeding from the nose 02/20/2015  . Fatigue 02/20/2015  . Fever of undetermined origin 02/20/2015  . Acid reflux 02/20/2015  . Headache, menstrual migraine 02/20/2015  . Cardiac murmur 02/20/2015  . H/O cardiovascular disorder 02/20/2015  . Cannot sleep 02/20/2015  . Depression, major, recurrent, moderate (HCC) 02/20/2015  . Hypoglycemia 02/20/2015  . Adaptive colitis 02/20/2015  . Influenza due to influenza A virus 02/20/2015  . Received influenza vaccination at hospital 02/20/2015  . Need for vaccination for meningococcus 02/20/2015  . Headache, migraine 02/20/2015  . Mild major depression (HCC) 02/20/2015  . Need for vaccination 02/20/2015  . Flu  vaccine need 02/20/2015  . Intermittent tremor 02/20/2015  . Awareness of heartbeats 02/20/2015  . Disease caused by virus 02/20/2015  . Lower urinary tract infection 02/20/2015  . Screening for depression 02/20/2015  . Screening examination for pulmonary tuberculosis 02/20/2015  . Counseling for guardian-child conflict 02/20/2015  . Panic disorder with agoraphobia and severe panic attacks 02/20/2015    History reviewed. No pertinent  past surgical history.  Current Outpatient Rx  Name  Route  Sig  Dispense  Refill  . ALPRAZolam (XANAX) 0.25 MG tablet   Oral   Take 1 tablet (0.25 mg total) by mouth as needed.   30 tablet   5   . citalopram (CELEXA) 20 MG tablet   Oral   Take 1 tablet (20 mg total) by mouth every evening.   30 tablet   11   . EPINEPHrine 0.3 mg/0.3 mL IJ SOAJ injection   Intramuscular   Inject 0.3 mLs (0.3 mg total) into the muscle daily.   1 Device   11   . methocarbamol (ROBAXIN) 500 MG tablet   Oral   Take 1 tablet (500 mg total) by mouth 2 (two) times daily.   20 tablet   0     Allergies Penicillins and Red dye  Family History  Problem Relation Age of Onset  . Depression Mother   . Anxiety disorder Mother   . Alcohol abuse Father   . Bipolar disorder Maternal Aunt   . Bipolar disorder Maternal Uncle   . Bipolar disorder Paternal Uncle   . Depression Maternal Grandmother   . Depression Paternal Grandmother   . Bipolar disorder Paternal Grandmother   . ADD / ADHD Cousin   . Depression Cousin     Social History Social History  Substance Use Topics  . Smoking status: Never Smoker   . Smokeless tobacco: Never Used  . Alcohol Use: No    Review of Systems Constitutional: No fever/chills. No syncope Eyes: No visual changes. ENT: No sore throat. Cardiovascular: Positive for chest pain and palpitations. Respiratory: Positive shortness of breath.  No cough. Gastrointestinal: No abdominal pain.  No nausea, no vomiting.  No diarrhea.  No constipation. Genitourinary: Negative for dysuria. Musculoskeletal: Negative for back pain. Skin: Negative for rash. Neurological: Negative for headaches, focal weakness or numbness. Psychiatric:Positive anxiety 10-point ROS otherwise negative.  ____________________________________________   PHYSICAL EXAM:  VITAL SIGNS: ED Triage Vitals  Enc Vitals Group     BP 08/19/15 1135 139/71 mmHg     Pulse Rate 08/19/15 1135 82     Resp  08/19/15 1135 16     Temp 08/19/15 1135 97.8 F (36.6 C)     Temp Source 08/19/15 1135 Oral     SpO2 08/19/15 1135 100 %     Weight 08/19/15 1135 185 lb (83.915 kg)     Height 08/19/15 1135 5\' 4"  (1.626 m)     Head Cir --      Peak Flow --      Pain Score 08/19/15 1135 3     Pain Loc --      Pain Edu? --      Excl. in GC? --     Constitutional: Alert and oriented. Well appearing and in no acute distress. Answer question appropriately and does not have an anxious affect. Eyes: Conjunctivae are normal.  EOMI. Head: Atraumatic. Nose: No congestion/rhinnorhea. Mouth/Throat: Mucous membranes are moist.  Neck: No stridor.  Supple.  No JVD Cardiovascular: Normal rate, regular rhythm. No murmurs,  rubs or gallops.  Respiratory: Normal respiratory effort.  No retractions. Lungs CTAB.  No wheezes, rales or ronchi. Gastrointestinal: Obese. Soft and nontender. No distention. No peritoneal signs. Musculoskeletal: No LE edema. No palpable cords or Homans sign. Neurologic:  Normal speech and language. No gross focal neurologic deficits are appreciated.  Skin:  Skin is warm, dry and intact. No rash noted. Psychiatric: Mood and affect are normal. Speech and behavior are normal.  Normal judgement.  ____________________________________________   LABS (all labs ordered are listed, but only abnormal results are displayed)  Labs Reviewed  CBC - Abnormal; Notable for the following:    RBC 5.22 (*)    MCV 78.1 (*)    MCH 25.0 (*)    RDW 15.0 (*)    All other components within normal limits  TROPONIN I  BASIC METABOLIC PANEL  FIBRIN DERIVATIVES D-DIMER ([facility] ONLY)  TSH   ____________________________________________  EKG  ED ECG REPORT I, Rockne Menghini, the attending physician, personally viewed and interpreted this ECG.   Date: 08/19/2015  EKG Time: 1139  Rate: 83  Rhythm: normal EKG, normal sinus rhythm, unchanged from previous tracings, normal sinus rhythm  Axis: Normal   Intervals:none  ST&T Change: Nonspecific T-wave inversion in V1. No ST elevation. No prolonged QTC. No LVH.  ____________________________________________  RADIOLOGY  No results found.  ____________________________________________   PROCEDURES  Procedure(s) performed: None  Critical Care performed: No ____________________________________________   INITIAL IMPRESSION / ASSESSMENT AND PLAN / ED COURSE  Pertinent labs & imaging results that were available during my care of the patient were reviewed by me and considered in my medical decision making (see chart for details).  19 y.o. female, otherwise healthy, presenting with chest pain. The patient has an atypical story but does have heart rates that are elevated without precipitating anxiety or stressful moments. Her EKG does not show arrhythmia, prolonged QTC, or Brugada syndrome. Clinically, PE is unlikely given that she is not tachycardic, short of breath or hypoxic, and has no evidence of DVT. However, given her second presentation this week for similar symptoms, I will check a d-dimer and image her if it is indicated. Her troponin from triage is negative. Will rule out electrolyte abnormality, pregnancy. GERD is also possible but less likely as she does not give typical symptoms. If the patient's workup for life-threatening causes of chest pain is negative here, I will encourage her to keep her the appt with the cardiologist that she has in several weeks. ____________________________________________   ----------------------------------------- 5:27 PM on 08/19/2015 -----------------------------------------  The patient is resting comfortably, and is requesting to be discharged at this time. I have recommended that she stay for the entirety of her evaluation, as her d-dimer is still pending. Her labs are otherwise reassuring. She has refused to provide urine for pregnancy test. Her EKG does not show any life threatening causes for her  chest pain. If her d-dimer is negative I will plan to discharge her. If it is positive, I will offer her further imaging. She understands return precautions and follow-up instructions.  FINAL CLINICAL IMPRESSION(S) / ED DIAGNOSES  Final diagnoses:  Chest pain, unspecified chest pain type  Tachycardia  Palpitations      NEW MEDICATIONS STARTED DURING THIS VISIT:  Discharge Medication List as of 08/19/2015  5:32 PM       Rockne Menghini, MD 08/19/15 2210

## 2015-08-19 NOTE — ED Notes (Signed)
Pt reports seen at [facility] two days ago for same, reports palpitations while at work HR in 130's and having dull chest pain. States given toradol and told to follow up with cardiologist. States has appt in 3 weeks. But pain has continued and intermittently having palpitations.

## 2015-08-22 ENCOUNTER — Telehealth: Payer: Self-pay | Admitting: Family Medicine

## 2015-08-22 DIAGNOSIS — K824 Cholesterolosis of gallbladder: Secondary | ICD-10-CM

## 2015-08-22 NOTE — Telephone Encounter (Signed)
Patient reports that she is still a little tender in her chest, but she does feel a little better. She reports that no meds were added or changed during her visit. She has not had any episodes( palpitations) since her visit. Patient plans to attend her appt on 10/13.

## 2015-08-22 NOTE — Telephone Encounter (Signed)
Pt was discharged from [facility] ER on 08/19/2015 for chest pain and rapid heart rate.  I have scheduled a hospital follow up appt/MW

## 2015-08-24 ENCOUNTER — Ambulatory Visit (INDEPENDENT_AMBULATORY_CARE_PROVIDER_SITE_OTHER): Payer: BLUE CROSS/BLUE SHIELD | Admitting: Family Medicine

## 2015-08-24 ENCOUNTER — Encounter: Payer: Self-pay | Admitting: Family Medicine

## 2015-08-24 VITALS — BP 112/68 | HR 92 | Temp 99.1°F | Resp 16 | Wt 184.0 lb

## 2015-08-24 DIAGNOSIS — F32A Depression, unspecified: Secondary | ICD-10-CM

## 2015-08-24 DIAGNOSIS — R002 Palpitations: Secondary | ICD-10-CM | POA: Diagnosis not present

## 2015-08-24 DIAGNOSIS — F329 Major depressive disorder, single episode, unspecified: Secondary | ICD-10-CM | POA: Diagnosis not present

## 2015-08-24 DIAGNOSIS — F419 Anxiety disorder, unspecified: Secondary | ICD-10-CM

## 2015-08-24 DIAGNOSIS — R079 Chest pain, unspecified: Secondary | ICD-10-CM | POA: Diagnosis not present

## 2015-08-24 MED ORDER — CLONAZEPAM 0.5 MG PO TABS: 0.5000 mg | ORAL_TABLET | Freq: Three times a day (TID) | ORAL | Status: DC | PRN

## 2015-08-24 NOTE — Progress Notes (Signed)
Patient ID: Lisa Grimes, female   DOB: Apr 03, 1996, 19 y.o.   MRN: [id number]       Patient: Lisa Grimes Female    DOB: 1996/11/09   19 y.o.   MRN: [id number] Visit Date: 08/24/2015  Today's Provider: Megan Mans, MD   Chief Complaint  Patient presents with  . Hospitalization Follow-up  . Chest Pain   Subjective:   Hospital Follow up Patient was seen in the ER on 08/19/2015 with complaints of chest pain and palpitations. She has a referral for cardiology on 09/04/15.  Chest Pain  This is a new problem. The current episode started in the past 7 days. The problem occurs intermittently. The problem has been gradually improving. The patient is experiencing no pain. The pain does not radiate. Associated symptoms include dizziness, malaise/fatigue and palpitations. Pertinent negatives include no abdominal pain, back pain, cough, fever, headaches, lower extremity edema, nausea, near-syncope, shortness of breath, syncope, vomiting or weakness. The pain is aggravated by nothing. She has tried rest for the symptoms. The treatment provided mild relief.  Patient reports that she has not had episodes since her ER visit.      Allergies  Allergen Reactions  . Penicillins Hives  . Red Dye     possible allergy   Previous Medications   ALPRAZOLAM (XANAX) 0.25 MG TABLET    Take 1 tablet (0.25 mg total) by mouth as needed.   CITALOPRAM (CELEXA) 20 MG TABLET    Take 1 tablet (20 mg total) by mouth every evening.   EPINEPHRINE 0.3 MG/0.3 ML IJ SOAJ INJECTION    Inject 0.3 mLs (0.3 mg total) into the muscle daily.   METHOCARBAMOL (ROBAXIN) 500 MG TABLET    Take 1 tablet (500 mg total) by mouth 2 (two) times daily.    Review of Systems  Constitutional: Positive for malaise/fatigue. Negative for fever.  Eyes: Negative.   Respiratory: Negative for cough and shortness of breath.   Cardiovascular: Positive for chest pain and palpitations. Negative for syncope and near-syncope.  Gastrointestinal:  Negative for nausea, vomiting and abdominal pain.  Musculoskeletal: Negative for back pain.  Neurological: Positive for dizziness. Negative for weakness and headaches.  Psychiatric/Behavioral: The patient is nervous/anxious.        Chronic issue.    Social History  Substance Use Topics  . Smoking status: Never Smoker   . Smokeless tobacco: Never Used  . Alcohol Use: No   Objective:   BP 112/68 mmHg  Pulse 92  Temp(Src) 99.1 F (37.3 C)  Resp 16  Wt 184 lb (83.462 kg)  SpO2 98%  LMP 07/22/2015  Physical Exam  Constitutional: She is oriented to person, place, and time. She appears well-developed and well-nourished.  HENT:  Head: Normocephalic and atraumatic.  Right Ear: External ear normal.  Left Ear: External ear normal.  Nose: Nose normal.  Eyes: Conjunctivae are normal.  Neck: Neck supple.  Cardiovascular: Normal rate, regular rhythm and normal heart sounds.   Pulmonary/Chest: Effort normal and breath sounds normal.  Abdominal: Soft. Bowel sounds are normal.  Musculoskeletal:  Chest wall nontender.  Neurological: She is alert and oriented to person, place, and time.  Skin: Skin is warm and dry.  Psychiatric: She has a normal mood and affect. Her behavior is normal. Judgment and thought content normal.        Assessment & Plan:     Chest Pain Chronic,recurrent,cardiology referral pending for this and palpitations. Anxiety/Depression Per psychiatry. At this time change Xanax to  Clonopin pending f/u with new psychiatrist. Obesity Palpitations      Megan Mansichard Gilbert Jr, MD  [facility]

## 2015-09-04 ENCOUNTER — Encounter: Payer: Self-pay | Admitting: Interventional Cardiology

## 2015-09-04 ENCOUNTER — Ambulatory Visit (INDEPENDENT_AMBULATORY_CARE_PROVIDER_SITE_OTHER): Payer: BLUE CROSS/BLUE SHIELD | Admitting: Interventional Cardiology

## 2015-09-04 VITALS — BP 110/80 | HR 72 | Ht 64.0 in | Wt 183.0 lb

## 2015-09-04 DIAGNOSIS — R002 Palpitations: Secondary | ICD-10-CM | POA: Diagnosis not present

## 2015-09-04 NOTE — Progress Notes (Signed)
Patient ID: Lisa Grimes, female   DOB: Nov 10, 1996, 19 y.o.   Lisa: [id number]     Cardiology Office Note   Date:  09/04/2015   ID:  Lisa Grimes, Lisa Grimes, Lisa Grimes  PCP:  Megan Mans, MD    No chief complaint on file.  palpitations   Wt Readings from Last 3 Encounters:  09/04/15 183 lb (83.008 kg)  08/24/15 184 lb (83.462 kg)  08/19/15 185 lb (83.915 kg)       History of Present Illness: Lisa Grimes is a 19 y.o. female who states she had a heart murmur as a child. She had an echocardiogram but no invasive management when she was 19 years old.  She has a long history of intermittent palpitations. Over the last few weeks, she has had several episodes where her heart rate sped up into the high 120s while she was just standing at work. She works as a Water quality scientist. A week ago, she was standing at work and her heart rate went up to 127. She spent the next 2 hours trying to get her heart rate down. It came down to 113 and then into the high 90s. She saw her heart rate based on the blood pressure cuff that was available at work. She reports chest pain that has been fairly constant. It is not always related to exertion. She was told she may have costochondritis. She has not had syncope.  The palpitations that they lasted a few hours. She has had another episode that was similar but shorter in duration. She is here for further evaluation.    Past Medical History  Diagnosis Date  . Dye allergic reaction   . Rhinitis, allergic   . Abnormal white blood cell count   . Anxiety   . Depression with anxiety   . Epistaxis   . Influenza A   . Gastroesophageal reflux disease   . Screening for depression   . Hypoglycemia   . Insomnia   . Menstrual headache   . Acute bronchitis   . Palpitations   . Migraine headache   . Mild major depression (HCC)   . Viral syndrome   . Fatigue   . UTI (lower urinary tract infection)   . Major depressive disorder, recurrent  episode, moderate with anxious distress (HCC)   . Parent-child relational problem   . Panic disorder with agoraphobia and severe panic attacks     History reviewed. No pertinent past surgical history.   Current Outpatient Prescriptions  Medication Sig Dispense Refill  . citalopram (CELEXA) 20 MG tablet Take 1 tablet (20 mg total) by mouth every evening. 30 tablet 11  . clonazePAM (KLONOPIN) 0.5 MG tablet Take 1 tablet (0.5 mg total) by mouth every 8 (eight) hours as needed for anxiety. 90 tablet 1  . EPINEPHrine 0.3 mg/0.3 mL IJ SOAJ injection Inject 0.3 mLs (0.3 mg total) into the muscle daily. 1 Device 11   No current facility-administered medications for this visit.    Allergies:   Penicillins and Red dye    Social History:  The patient  reports that she has never smoked. She has never used smokeless tobacco. She reports that she does not drink alcohol or use illicit drugs.   Family History:  The patient's family history includes ADD / ADHD in her cousin; Alcohol abuse in her father; Anxiety disorder in her mother; Bipolar disorder in her maternal aunt, maternal uncle, paternal grandmother, and paternal uncle; Depression in her cousin,  maternal grandmother, mother, and paternal grandmother; Heart attack in her maternal aunt and maternal uncle; Hypertension in her father, maternal grandfather, maternal grandmother, and mother; Stroke in her maternal grandfather and maternal grandmother.    ROS:  Please see the history of present illness.   Otherwise, review of systems are positive for anxiety symptoms as well, she was recently started on Klonopin. She has not been taking her Celexa as prescribed.   All other systems are reviewed and negative.    PHYSICAL EXAM: VS:  BP 110/80 mmHg  Pulse 72  Ht 5\' 4"  (1.626 m)  Wt 183 lb (83.008 kg)  BMI 31.40 kg/m2  LMP 07/22/2015 , BMI Body mass index is 31.4 kg/(m^2). GEN: Well nourished, well developed, in no acute distress HEENT:  normal Neck: no JVD, carotid bruits, or masses Cardiac: RRR; no murmurs, rubs, or gallops,no edema  Respiratory:  clear to auscultation bilaterally, normal work of breathing GI: soft, nontender, nondistended, + BS MS: no deformity or atrophy Skin: warm and dry, no rash Neuro:  Strength and sensation are intact Psych: euthymic mood, full affect   EKG:   The ekg ordered today demonstrates normal ECG.   Recent Labs: 08/17/2015: ALT 14 08/19/2015: BUN 9; Creatinine, Ser 0.66; Hemoglobin 13.0; Platelets 196; Potassium 3.7; Sodium 138; TSH 2.204   Lipid Panel No results found for: CHOL, TRIG, HDL, CHOLHDL, VLDL, LDLCALC, LDLDIRECT   Other studies Reviewed: Additional studies/ records that were reviewed today with results demonstrating: Prior ECGs compared, fastest heart rate was 94.   ASSESSMENT AND PLAN:  1. Palpitations: No high risk features. Cardiac exam is normal. Plan 30 day event monitor to further evaluate. I suspect she may have some episodic sinus tachycardia. The fast heart rate that she had documented at work did seem to come then gradually and did not suddenly break into a normal rhythm. There may be a component of anxiety given what I see on her medicine list.  2. Family history of coronary artery disease. She is not sure what age people develop their CAD. She does have a 20 year old cousin that she reports has paroxysmal atrial fibrillation.    Current medicines are reviewed at length with the patient today.  The patient concerns regarding her medicines were addressed.  The following changes have been made:  No change  Labs/ tests ordered today include:   Orders Placed This Encounter  Procedures  . Cardiac event monitor  . EKG 12-Lead    Recommend 150 minutes/week of aerobic exercise Low fat, low carb, high fiber diet recommended  Disposition:   FU in prn   Delorise JacksonSigned, Marjean Imperato S., MD  09/04/2015 3:52 PM    [facility] [address], [place], [place]  [address] Phone: [phone]) [phone]; Fax: [phone]

## 2015-09-04 NOTE — Patient Instructions (Signed)
Medication Instructions:  Same-no changes  Labwork: None  Testing/Procedures: Your physician has recommended that you wear an event monitor for 30 days. Event monitors are medical devices that record the heart's electrical activity. Doctors most often us these monitors to diagnose arrhythmias. Arrhythmias are problems with the speed or rhythm of the heartbeat. The monitor is a small, portable device. You can wear one while you do your normal daily activities. This is usually used to diagnose what is causing palpitations/syncope (passing out).    Follow-Up: Your physician recommends that you schedule a follow-up appointment in: as needed        If you need a refill on your cardiac medications before your next appointment, please call your pharmacy.

## 2015-09-08 ENCOUNTER — Ambulatory Visit (INDEPENDENT_AMBULATORY_CARE_PROVIDER_SITE_OTHER): Payer: BLUE CROSS/BLUE SHIELD

## 2015-09-08 DIAGNOSIS — R002 Palpitations: Secondary | ICD-10-CM

## 2015-09-17 ENCOUNTER — Ambulatory Visit
Admission: EM | Admit: 2015-09-17 | Discharge: 2015-09-17 | Discharge status: Absent without leave | Payer: BLUE CROSS/BLUE SHIELD

## 2015-09-18 ENCOUNTER — Ambulatory Visit (INDEPENDENT_AMBULATORY_CARE_PROVIDER_SITE_OTHER): Payer: BLUE CROSS/BLUE SHIELD | Admitting: Family Medicine

## 2015-09-18 ENCOUNTER — Encounter: Payer: Self-pay | Admitting: Family Medicine

## 2015-09-18 VITALS — BP 120/80 | HR 72 | Temp 98.2°F | Resp 16 | Wt 184.0 lb

## 2015-09-18 DIAGNOSIS — R1011 Right upper quadrant pain: Secondary | ICD-10-CM | POA: Diagnosis not present

## 2015-09-18 NOTE — Progress Notes (Signed)
Patient ID: Lisa Grimes, female   DOB: 04/26/1996, 19 y.o.   MRN: [id number]   Lisa Grimes  MRN: [id number] DOB: 05/16/1996  Subjective:  HPI   1. RUQ pain Patient is a 19 year old female who presents for evaluation of RUQ pain for 5 days.  She was seen by [facility] 3 days ago.  Per the patient she had a negative urine test and was told they thought it may be her gall bladder.  They were however unable to perform any test on the gall bladder.  [facility] to see if they could do an ultrasound but was told that they would not be able to do one on her because they only do them on certain days.  Because they were not going to be able to do the U/S she did not stay to see the provider.  Patient states she stated with symptoms of nausea, vomiting, upper back pain that radiated around to the front and into her shoulder beginning 5 days ago.  The pain and nausea has gotten slightly worse over the 5 day but the vomiting is better and she only vomited a time or two.  Patient states that the pain is worse when she walks around.  Patient Active Problem List   Diagnosis Date Noted  . Abnormal WBC count 02/20/2015  . Allergic reaction to dye 02/20/2015  . Allergic rhinitis 02/20/2015  . Routine general medical examination at a health care facility 02/20/2015  . Anxiety 02/20/2015  . Family planning 02/20/2015  . Acute bronchitis 02/20/2015  . Anxiety and depression 02/20/2015  . Bleeding from the nose 02/20/2015  . Fatigue 02/20/2015  . Fever of undetermined origin 02/20/2015  . Acid reflux 02/20/2015  . Headache, menstrual migraine 02/20/2015  . Cardiac murmur 02/20/2015  . H/O cardiovascular disorder 02/20/2015  . Cannot sleep 02/20/2015  . Depression, major, recurrent, moderate (HCC) 02/20/2015  . Hypoglycemia 02/20/2015  . Adaptive colitis 02/20/2015  . Influenza due to influenza A virus 02/20/2015  . Received influenza vaccination at hospital 02/20/2015    . Need for vaccination for meningococcus 02/20/2015  . Headache, migraine 02/20/2015  . Mild major depression (HCC) 02/20/2015  . Need for vaccination 02/20/2015  . Flu vaccine need 02/20/2015  . Intermittent tremor 02/20/2015  . Awareness of heartbeats 02/20/2015  . Disease caused by virus 02/20/2015  . Lower urinary tract infection 02/20/2015  . Screening for depression 02/20/2015  . Screening examination for pulmonary tuberculosis 02/20/2015  . Counseling for guardian-child conflict 02/20/2015  . Panic disorder with agoraphobia and severe panic attacks 02/20/2015    Past Medical History  Diagnosis Date  . Dye allergic reaction   . Rhinitis, allergic   . Abnormal white blood cell count   . Anxiety   . Depression with anxiety   . Epistaxis   . Influenza A   . Gastroesophageal reflux disease   . Screening for depression   . Hypoglycemia   . Insomnia   . Menstrual headache   . Acute bronchitis   . Palpitations   . Migraine headache   . Mild major depression (HCC)   . Viral syndrome   . Fatigue   . UTI (lower urinary tract infection)   . Major depressive disorder, recurrent episode, moderate with anxious distress (HCC)   . Parent-child relational problem   . Panic disorder with agoraphobia and severe panic attacks     Social History   Social History  .  Marital Status: Significant Other    Spouse Name: N/A  . Number of Children: N/A  . Years of Education: N/A   Occupational History  . Not on file.   Social History Main Topics  . Smoking status: Never Smoker   . Smokeless tobacco: Never Used  . Alcohol Use: No  . Drug Use: No  . Sexual Activity: Not on file   Other Topics Concern  . Not on file   Social History Narrative    Outpatient Prescriptions Prior to Visit  Medication Sig Dispense Refill  . citalopram (CELEXA) 20 MG tablet Take 1 tablet (20 mg total) by mouth every evening. 30 tablet 11  . clonazePAM (KLONOPIN) 0.5 MG tablet Take 1 tablet (0.5  mg total) by mouth every 8 (eight) hours as needed for anxiety. 90 tablet 1  . EPINEPHrine 0.3 mg/0.3 mL IJ SOAJ injection Inject 0.3 mLs (0.3 mg total) into the muscle daily. 1 Device 11   No facility-administered medications prior to visit.    Allergies  Allergen Reactions  . Penicillins Hives  . Red Dye     possible allergy    Review of Systems  Constitutional: Negative for fever, chills, weight loss, malaise/fatigue and diaphoresis.  Eyes: Negative.   Respiratory: Negative for cough, hemoptysis, sputum production, shortness of breath and wheezing.   Cardiovascular: Negative for chest pain, palpitations, orthopnea, claudication, leg swelling and PND.  Gastrointestinal: Positive for heartburn, nausea and vomiting (Not in 3 days). Negative for abdominal pain, diarrhea, constipation and blood in stool.  Genitourinary: Negative.   Musculoskeletal: Negative for myalgias, back pain and joint pain.  Neurological: Positive for headaches. Negative for dizziness and weakness.  Endo/Heme/Allergies: Negative.   Psychiatric/Behavioral: Negative.    Objective:  BP 120/80 mmHg  Pulse 72  Temp(Src) 98.2 F (36.8 C) (Oral)  Resp 16  Wt 184 lb (83.462 kg)  LMP  (Within Weeks)  Physical Exam  Constitutional: She is oriented to person, place, and time and well-developed, well-nourished, and in no distress.  HENT:  Head: Normocephalic and atraumatic.  Right Ear: External ear normal.  Left Ear: External ear normal.  Nose: Nose normal.  Eyes: Conjunctivae are normal.  Neck: Neck supple.  Cardiovascular: Normal rate, regular rhythm and normal heart sounds.   Pulmonary/Chest: Effort normal and breath sounds normal.  Abdominal: Soft. Bowel sounds are normal. She exhibits no distension and no mass. There is no tenderness. There is no rebound and no guarding.  No CVA tenderness  Neurological: She is alert and oriented to person, place, and time.  Skin: Skin is warm and dry.  Psychiatric:  Mood, memory, affect and judgment normal.    Assessment and Plan :  1. RUQ pain Patient is convinced she has gallbladder disease or get lab work and ultrasound. The nuclei do not believe this is a case. This appears to be musculoskeletal pain is aggravated by certain movements. No pleuritic pain. She verbally says of the pain is severe but she is very comfortable in the exam room. - US Abdomen Complete; Future - CBC With Differential/Platelet - COMPLETE METABOLIC PANEL WITH GFR 2. Chronic anxiety 3. Depression In remission  Julieanne Manson MD [facility] 09/18/2015 2:02 PM

## 2015-09-19 ENCOUNTER — Ambulatory Visit
Admission: RE | Admit: 2015-09-19 | Discharge: 2015-09-19 | Disposition: A | Discharge status: Home / Self Care | Payer: BLUE CROSS/BLUE SHIELD | Source: Ambulatory Visit | Attending: Family Medicine | Admitting: Family Medicine

## 2015-09-19 DIAGNOSIS — R112 Nausea with vomiting, unspecified: Secondary | ICD-10-CM | POA: Diagnosis present

## 2015-09-19 DIAGNOSIS — R1011 Right upper quadrant pain: Secondary | ICD-10-CM | POA: Diagnosis not present

## 2015-09-19 LAB — COMPREHENSIVE METABOLIC PANEL
A/G RATIO: 1.5 (ref 1.1–2.5)
ALK PHOS: 92 IU/L (ref 39–117)
ALT: 10 IU/L (ref 0–32)
AST: 22 IU/L (ref 0–40)
Albumin: 4.4 g/dL (ref 3.5–5.5)
BUN/Creatinine Ratio: 13 (ref 8–20)
BUN: 10 mg/dL (ref 6–20)
Bilirubin Total: 0.3 mg/dL (ref 0.0–1.2)
CALCIUM: 9.5 mg/dL (ref 8.7–10.2)
CO2: 26 mmol/L (ref 18–29)
Chloride: 101 mmol/L (ref 97–106)
Creatinine, Ser: 0.78 mg/dL (ref 0.57–1.00)
GFR calc Af Amer: 127 mL/min/{1.73_m2} (ref >59)
GFR, EST NON AFRICAN AMERICAN: 110 mL/min/{1.73_m2} (ref >59)
GLOBULIN, TOTAL: 3 g/dL (ref 1.5–4.5)
Glucose: 93 mg/dL (ref 65–99)
POTASSIUM: 4.9 mmol/L (ref 3.5–5.2)
SODIUM: 140 mmol/L (ref 136–144)
Total Protein: 7.4 g/dL (ref 6.0–8.5)

## 2015-09-19 LAB — CBC WITH DIFFERENTIAL/PLATELET
Basophils Absolute: 0 10*3/uL (ref 0.0–0.2)
Basos: 0 %
EOS (ABSOLUTE): 0.1 10*3/uL (ref 0.0–0.4)
Eos: 1 %
HEMATOCRIT: 41.4 % (ref 34.0–46.6)
Hemoglobin: 13.4 g/dL (ref 11.1–15.9)
IMMATURE GRANULOCYTES: 0 %
Immature Grans (Abs): 0 10*3/uL (ref 0.0–0.1)
LYMPHS ABS: 2.4 10*3/uL (ref 0.7–3.1)
Lymphs: 32 %
MCH: 25.7 pg — ABNORMAL LOW (ref 26.6–33.0)
MCHC: 32.4 g/dL (ref 31.5–35.7)
MCV: 79 fL (ref 79–97)
MONOS ABS: 0.5 10*3/uL (ref 0.1–0.9)
Monocytes: 7 %
NEUTROS PCT: 60 %
Neutrophils Absolute: 4.4 10*3/uL (ref 1.4–7.0)
PLATELETS: 227 10*3/uL (ref 150–379)
RBC: 5.22 x10E6/uL (ref 3.77–5.28)
RDW: 13.9 % (ref 12.3–15.4)
WBC: 7.5 10*3/uL (ref 3.4–10.8)

## 2015-09-19 NOTE — Telephone Encounter (Signed)
Pt informed. Referral made. 

## 2015-09-19 NOTE — Addendum Note (Signed)
Addended by: Cyndia BentBYRD, Ha Shannahan M on: 09/19/2015 04:24 PM   Modules accepted: Orders

## 2015-09-19 NOTE — Telephone Encounter (Signed)
-----   Message from Maple Hudsonichard L Gilbert Jr., MD sent at 09/19/2015 11:50 AM EST ----- Possible gallbladder polyp. Consider referral to surgeon.

## 2015-09-19 NOTE — Telephone Encounter (Signed)
[facility] ED 

## 2015-09-20 ENCOUNTER — Encounter: Payer: Self-pay | Admitting: *Deleted

## 2015-09-20 ENCOUNTER — Encounter: Payer: Self-pay | Admitting: Family Medicine

## 2015-09-26 ENCOUNTER — Ambulatory Visit (INDEPENDENT_AMBULATORY_CARE_PROVIDER_SITE_OTHER): Payer: BLUE CROSS/BLUE SHIELD | Admitting: General Surgery

## 2015-09-26 ENCOUNTER — Encounter: Payer: Self-pay | Admitting: General Surgery

## 2015-09-26 VITALS — BP 130/80 | HR 76 | Resp 14 | Ht 64.0 in | Wt 183.0 lb

## 2015-09-26 DIAGNOSIS — R1011 Right upper quadrant pain: Secondary | ICD-10-CM

## 2015-09-26 MED ORDER — PANTOPRAZOLE SODIUM 40 MG PO TBEC: 40.0000 mg | DELAYED_RELEASE_TABLET | Freq: Every day | ORAL | Status: DC

## 2015-09-26 NOTE — Patient Instructions (Addendum)
The patient is aware to call back for any questions or concerns.  Patient is scheduled for a HIDA scan with EF at [facility] for 10/02/15 at 8:00 am. She will arrive there byr 7:45 am. She is to have nothing to eat or drink after midnight and may not have any stomach medications or antacids the morning of. Patient is aware of date, time, and instructions.

## 2015-09-26 NOTE — Progress Notes (Signed)
Patient ID: Lisa Grimes, female   DOB: 04-06-1996, 19 y.o.   MRN: [id number]  Chief Complaint  Patient presents with  . Other    gall bladder polyp    HPI Lisa Grimes is a 19 y.o. female.  Here today for evaluation of a gallbladder polyp. She states about 2 weeks ago she started having right upper abdominal pain radiating to her shoulder associated with nausea. Pain is constant but waxes and wanes. Denies vomiting. She has been nauseated with all meals as well. Pain is worst when she is up and moving around. Abdominal ultrasound was 09-19-15 showing a 3 mm gallbladder polyp. I have reviewed the history of present illness with the patient. HPI  Past Medical History  Diagnosis Date  . Dye allergic reaction   . Rhinitis, allergic   . Abnormal white blood cell count   . Anxiety   . Depression with anxiety   . Epistaxis   . Influenza A   . Gastroesophageal reflux disease   . Screening for depression   . Hypoglycemia   . Insomnia   . Menstrual headache   . Acute bronchitis   . Palpitations   . Migraine headache   . Mild major depression (HCC)   . Viral syndrome   . Fatigue   . UTI (lower urinary tract infection)   . Major depressive disorder, recurrent episode, moderate with anxious distress (HCC)   . Parent-child relational problem   . Panic disorder with agoraphobia and severe panic attacks     Past Surgical History  Procedure Laterality Date  . Tonsillectomy  2003  . Mouth surgery  2010  . Tubes in ear  2000  . Strabismus surgery  1997  . Eye surgery      Family History  Problem Relation Age of Onset  . Depression Mother   . Anxiety disorder Mother   . Alcohol abuse Father   . Bipolar disorder Maternal Aunt   . Bipolar disorder Maternal Uncle   . Bipolar disorder Paternal Uncle   . Depression Maternal Grandmother   . Depression Paternal Grandmother   . Bipolar disorder Paternal Grandmother   . ADD / ADHD Cousin   . Depression Cousin   . Heart attack  Maternal Aunt   . Heart attack Maternal Uncle   . Stroke Maternal Grandmother   . Stroke Maternal Grandfather   . Hypertension Mother   . Hypertension Father   . Hypertension Maternal Grandmother   . Hypertension Maternal Grandfather     Social History Social History  Substance Use Topics  . Smoking status: Never Smoker   . Smokeless tobacco: Never Used  . Alcohol Use: No    Allergies  Allergen Reactions  . Penicillins Hives  . Red Dye     possible allergy    Current Outpatient Prescriptions  Medication Sig Dispense Refill  . citalopram (CELEXA) 20 MG tablet Take 1 tablet (20 mg total) by mouth every evening. 30 tablet 11  . clonazePAM (KLONOPIN) 0.5 MG tablet Take 1 tablet (0.5 mg total) by mouth every 8 (eight) hours as needed for anxiety. 90 tablet 1  . EPINEPHrine 0.3 mg/0.3 mL IJ SOAJ injection Inject 0.3 mLs (0.3 mg total) into the muscle daily. 1 Device 11  . pantoprazole (PROTONIX) 40 MG tablet Take 1 tablet (40 mg total) by mouth daily. 30 tablet 0   No current facility-administered medications for this visit.    Review of Systems Review of Systems  Constitutional: Negative.  Respiratory: Negative.   Cardiovascular: Negative.   Gastrointestinal: Positive for nausea and abdominal pain. Negative for vomiting, diarrhea and constipation.    Blood pressure 130/80, pulse 76, resp. rate 14, height 5\' 4"  (1.626 m), weight 183 lb (83.008 kg), last menstrual period 09/12/2015.  Physical Exam Physical Exam  Constitutional: She is oriented to person, place, and time. She appears well-developed and well-nourished.  HENT:  Head: Normocephalic.  Eyes: Conjunctivae are normal.  Neck: Neck supple.  Cardiovascular: Normal rate, regular rhythm and normal heart sounds.   Pulmonary/Chest: Effort normal and breath sounds normal.  Abdominal: Soft. Bowel sounds are normal. She exhibits no distension. There is no hepatomegaly. There is tenderness in the right upper quadrant.   Neurological: She is alert and oriented to person, place, and time.  Skin: Skin is warm and dry.  Psychiatric: She has a normal mood and affect. Her behavior is normal.    Data Reviewed Gallbladder ultrasound showing 3 mm polyp, no evidence of stones or wall thickening  Assessment    Abdominal pain, with nausea. Unlikely to be caused by small gallbladder polyp found on ultrasound. Reasonable to consider prescription medications as OTC products have not helped to resolve symptoms.  Discussed fully with patient and she is agreeable with the plan.    Plan    Trial of PPI for one month and H. Pylori testing ordered. HIDA scan to be scheduled.     Patient is scheduled for a HIDA scan with EF at [facility] for 10/02/15 at 8:00 am. She will arrive there byr 7:45 am. She is to have nothing to eat or drink after midnight and may not have any stomach medications or antacids the morning of. Patient is aware of date, time, and instructions.   PCP:  Kennyth ArnoldGilbert, Richard  SANKAR,SEEPLAPUTHUR G 09/27/2015, 6:12 PM

## 2015-09-27 ENCOUNTER — Encounter: Payer: Self-pay | Admitting: General Surgery

## 2015-09-27 LAB — H. PYLORI ANTIBODY, IGG: H Pylori IgG: <0.9 U/mL (ref 0.0–0.8)

## 2015-10-02 ENCOUNTER — Telehealth: Payer: Self-pay | Admitting: *Deleted

## 2015-10-02 ENCOUNTER — Ambulatory Visit
Admission: RE | Admit: 2015-10-02 | Discharge: 2015-10-02 | Disposition: A | Discharge status: Home / Self Care | Payer: BLUE CROSS/BLUE SHIELD | Source: Ambulatory Visit | Attending: General Surgery | Admitting: General Surgery

## 2015-10-02 DIAGNOSIS — R1011 Right upper quadrant pain: Secondary | ICD-10-CM | POA: Diagnosis not present

## 2015-10-02 MED ORDER — TECHNETIUM TC 99M MEBROFENIN IV KIT
5.4200 | PACK | Freq: Once | INTRAVENOUS | Status: DC | PRN
  Administered 2015-10-02: 5.42 via INTRAVENOUS
  Filled 2015-10-02: qty 6

## 2015-10-02 MED ORDER — SINCALIDE 5 MCG IJ SOLR
0.0200 ug/kg | Freq: Once | INTRAMUSCULAR | Status: AC
  Administered 2015-10-02: 1.66 ug via INTRAVENOUS

## 2015-10-02 NOTE — Telephone Encounter (Signed)
Patient notified as instructed.   An appointment has been scheduled for patient to see Dr. Evette CristalSankar on 10-03-15 at 3:45 pm.

## 2015-10-02 NOTE — Telephone Encounter (Signed)
-----   Message from Kieth BrightlySeeplaputhur G Sankar, MD sent at 10/02/2015 11:51 AM EST ----- Inform pt labs and HIDA scan were normal. Appt to see me

## 2015-10-03 ENCOUNTER — Ambulatory Visit: Payer: BLUE CROSS/BLUE SHIELD | Admitting: General Surgery

## 2015-11-08 ENCOUNTER — Encounter: Payer: Self-pay | Admitting: *Deleted

## 2015-11-16 ENCOUNTER — Other Ambulatory Visit: Payer: Self-pay

## 2016-01-29 ENCOUNTER — Ambulatory Visit (INDEPENDENT_AMBULATORY_CARE_PROVIDER_SITE_OTHER): Payer: BLUE CROSS/BLUE SHIELD | Admitting: Family Medicine

## 2016-01-29 VITALS — BP 108/72 | HR 80 | Temp 98.0°F | Resp 16 | Wt 190.0 lb

## 2016-01-29 DIAGNOSIS — F329 Major depressive disorder, single episode, unspecified: Secondary | ICD-10-CM

## 2016-01-29 DIAGNOSIS — F32A Depression, unspecified: Secondary | ICD-10-CM

## 2016-01-29 DIAGNOSIS — R079 Chest pain, unspecified: Secondary | ICD-10-CM | POA: Diagnosis not present

## 2016-01-29 DIAGNOSIS — F419 Anxiety disorder, unspecified: Secondary | ICD-10-CM | POA: Diagnosis not present

## 2016-01-29 MED ORDER — DULOXETINE HCL 20 MG PO CPEP: 20.0000 mg | ORAL_CAPSULE | Freq: Every day | ORAL | Status: DC

## 2016-01-29 MED ORDER — ALPRAZOLAM 0.5 MG PO TBDP: 0.5000 mg | ORAL_TABLET | Freq: Three times a day (TID) | ORAL | Status: DC | PRN

## 2016-01-29 NOTE — Progress Notes (Signed)
Patient ID: Lisa Grimes, female   DOB: May 30, 1996, 20 y.o.   MRN: [id number]    Subjective:  HPI  Patient is here to discuss anxiety and depression. Patient states her symptoms started to get worse about 1 week ago and she states nothing happened to trigger this issue she just started to feel bad again. Has had some panic attacks and chest pain. She is not on any medications for symptoms. She use to see psychiatrist but it gotten expensive and her insurance is not great for that coverage. Her last office visit was in October 2016. No changes in medication were made. She did get RX for Klonopin though but she lost it so she never took that medication to see if it would help. Depression screen PHQ 2/9 01/29/2016  Decreased Interest 3  Down, Depressed, Hopeless 1  PHQ - 2 Score 4  Altered sleeping 3  Tired, decreased energy 3  Change in appetite 1  Feeling bad or failure about yourself  1  Trouble concentrating 1  Moving slowly or fidgety/restless 0  Suicidal thoughts 0  PHQ-9 Score 13  Difficult doing work/chores Very difficult     Prior to Admission medications   Medication Sig Start Date End Date Taking? Authorizing Provider  EPINEPHrine 0.3 mg/0.3 mL IJ SOAJ injection Inject 0.3 mLs (0.3 mg total) into the muscle daily. 05/26/15  Yes Bonnie Roig Hulen Shouts., MD  pantoprazole (PROTONIX) 40 MG tablet Take 1 tablet (40 mg total) by mouth daily. 09/26/15  Yes Seeplaputhur Wynona Luna, MD  clonazePAM (KLONOPIN) 0.5 MG tablet Take 1 tablet (0.5 mg total) by mouth every 8 (eight) hours as needed for anxiety. Patient not taking: Reported on 01/29/2016 08/24/15   Maple Hudson., MD    Patient Active Problem List   Diagnosis Date Noted  . Abnormal WBC count 02/20/2015  . Allergic reaction to dye 02/20/2015  . Allergic rhinitis 02/20/2015  . Routine general medical examination at a health care facility 02/20/2015  . Anxiety 02/20/2015  . Family planning 02/20/2015  . Acute bronchitis  02/20/2015  . Anxiety and depression 02/20/2015  . Bleeding from the nose 02/20/2015  . Fatigue 02/20/2015  . Fever of undetermined origin 02/20/2015  . Acid reflux 02/20/2015  . Headache, menstrual migraine 02/20/2015  . Cardiac murmur 02/20/2015  . H/O cardiovascular disorder 02/20/2015  . Cannot sleep 02/20/2015  . Depression, major, recurrent, moderate (HCC) 02/20/2015  . Hypoglycemia 02/20/2015  . Adaptive colitis 02/20/2015  . Influenza due to influenza A virus 02/20/2015  . Received influenza vaccination at hospital 02/20/2015  . Need for vaccination for meningococcus 02/20/2015  . Headache, migraine 02/20/2015  . Mild major depression (HCC) 02/20/2015  . Need for vaccination 02/20/2015  . Flu vaccine need 02/20/2015  . Intermittent tremor 02/20/2015  . Awareness of heartbeats 02/20/2015  . Disease caused by virus 02/20/2015  . Lower urinary tract infection 02/20/2015  . Screening for depression 02/20/2015  . Screening examination for pulmonary tuberculosis 02/20/2015  . Counseling for guardian-child conflict 02/20/2015  . Panic disorder with agoraphobia and severe panic attacks 02/20/2015    Past Medical History  Diagnosis Date  . Dye allergic reaction   . Rhinitis, allergic   . Abnormal white blood cell count   . Anxiety   . Depression with anxiety   . Epistaxis   . Influenza A   . Gastroesophageal reflux disease   . Screening for depression   . Hypoglycemia   . Insomnia   .  Menstrual headache   . Acute bronchitis   . Palpitations   . Migraine headache   . Mild major depression (HCC)   . Viral syndrome   . Fatigue   . UTI (lower urinary tract infection)   . Major depressive disorder, recurrent episode, moderate with anxious distress (HCC)   . Parent-child relational problem   . Panic disorder with agoraphobia and severe panic attacks     Social History   Social History  . Marital Status: Single    Spouse Name: N/A  . Number of Children: N/A  .  Years of Education: N/A   Occupational History  . Not on file.   Social History Main Topics  . Smoking status: Never Smoker   . Smokeless tobacco: Never Used  . Alcohol Use: No  . Drug Use: No  . Sexual Activity: Not on file   Other Topics Concern  . Not on file   Social History Narrative    Allergies  Allergen Reactions  . Penicillins Hives  . Red Dye     possible allergy    Review of Systems  Constitutional: Negative.   Respiratory: Negative.   Cardiovascular: Positive for chest pain.  Musculoskeletal: Negative.   Psychiatric/Behavioral: Positive for depression. The patient is nervous/anxious and has insomnia.      There is no immunization history on file for this patient. Objective:  BP 108/72 mmHg  Pulse 80  Temp(Src) 98 F (36.7 C)  Resp 16  Wt 190 lb (86.183 kg)  Physical Exam  Constitutional: She is oriented to person, place, and time and well-developed, well-nourished, and in no distress.  HENT:  Head: Normocephalic and atraumatic.  Right Ear: External ear normal.  Left Ear: External ear normal.  Nose: Nose normal.  Eyes: Conjunctivae are normal.  Neck: Neck supple.  Cardiovascular: Normal rate, regular rhythm, normal heart sounds and intact distal pulses.   No murmur heard. Pulmonary/Chest: Effort normal and breath sounds normal. No respiratory distress. She has no wheezes.  Abdominal: Soft.  Neurological: She is alert and oriented to person, place, and time.  Skin: Skin is warm and dry.  Psychiatric: Memory and judgment normal. Her mood appears anxious. She is agitated. She exhibits a depressed mood. She expresses no homicidal and no suicidal ideation. She expresses no suicidal plans and no homicidal plans. She has a flat affect.    Lab Results  Component Value Date   WBC 7.5 09/18/2015   HGB 13.0 08/19/2015   HCT 41.4 09/18/2015   PLT 227 09/18/2015   GLUCOSE 93 09/18/2015   TSH 2.204 08/19/2015    CMP     Component Value Date/Time     NA 140 09/18/2015 1445   NA 138 08/19/2015 1519   NA 138 12/23/2013 2222   K 4.9 09/18/2015 1445   K 3.7 12/23/2013 2222   CL 101 09/18/2015 1445   CL 106 12/23/2013 2222   CO2 26 09/18/2015 1445   CO2 28* 12/23/2013 2222   GLUCOSE 93 09/18/2015 1445   GLUCOSE 89 08/19/2015 1519   GLUCOSE 126* 12/23/2013 2222   BUN 10 09/18/2015 1445   BUN 9 08/19/2015 1519   BUN 11 12/23/2013 2222   CREATININE 0.78 09/18/2015 1445   CREATININE 0.74 12/23/2013 2222   CALCIUM 9.5 09/18/2015 1445   CALCIUM 8.8* 12/23/2013 2222   PROT 7.4 09/18/2015 1445   PROT 7.5 08/17/2015 1806   ALBUMIN 4.4 09/18/2015 1445   ALBUMIN 4.0 08/17/2015 1806   AST 22 09/18/2015  1445   ALT 10 09/18/2015 1445   ALKPHOS 92 09/18/2015 1445   BILITOT 0.3 09/18/2015 1445   BILITOT 0.5 08/17/2015 1806   GFRNONAA 110 09/18/2015 1445   GFRNONAA >60 12/23/2013 2222   GFRAA 127 09/18/2015 1445   GFRAA >60 12/23/2013 2222    Assessment and Plan :  1. Depression Worsening, severe. Discussed this issue with patient and her mom today that patient needs to see psychiatrist. Will try Cymbalta and [facility]. - Ambulatory referral to Psychiatry - DULoxetine (CYMBALTA) 20 MG capsule; Take 1 capsule (20 mg total) by mouth daily.  Dispense: 30 capsule; Refill: 5 At her age patient could impact easily have bipolar disorder so psychiatric referral is a must. 2. Anxiety - Ambulatory referral to Psychiatry - DULoxetine (CYMBALTA) 20 MG capsule; Take 1 capsule (20 mg total) by mouth daily.  Dispense: 30 capsule; Refill: 5 More than 50% of visit spent in counseling with patient and her mother. 3. Chest pain, unspecified chest pain type Due to panic attacks/anxiety.  I have done the exam and reviewed the above chart and it is accurate to the best of my knowledge.  Julieanne Mansonichard  MD [facility] 01/29/2016 3:28 PM

## 2016-02-02 ENCOUNTER — Telehealth: Payer: Self-pay

## 2016-02-02 NOTE — Telephone Encounter (Signed)
Received forms for disability on patient from [employer]. Will address with Dr. Sullivan LoneGilbert on Monday 27th-aa

## 2016-02-05 NOTE — Telephone Encounter (Signed)
Form placed for you to review, let me know when you are done,. Thanks-aa

## 2016-03-04 ENCOUNTER — Ambulatory Visit: Payer: BLUE CROSS/BLUE SHIELD | Admitting: Family Medicine

## 2016-03-06 ENCOUNTER — Encounter: Payer: Self-pay | Admitting: *Deleted

## 2016-03-06 ENCOUNTER — Emergency Department: Payer: BLUE CROSS/BLUE SHIELD

## 2016-03-06 ENCOUNTER — Emergency Department
Admission: EM | Admit: 2016-03-06 | Discharge: 2016-03-06 | Disposition: A | Discharge status: Home / Self Care | Payer: BLUE CROSS/BLUE SHIELD | Attending: Emergency Medicine | Admitting: Emergency Medicine

## 2016-03-06 DIAGNOSIS — F329 Major depressive disorder, single episode, unspecified: Secondary | ICD-10-CM | POA: Diagnosis not present

## 2016-03-06 DIAGNOSIS — Z79899 Other long term (current) drug therapy: Secondary | ICD-10-CM | POA: Insufficient documentation

## 2016-03-06 DIAGNOSIS — Y999 Unspecified external cause status: Secondary | ICD-10-CM | POA: Insufficient documentation

## 2016-03-06 DIAGNOSIS — Y929 Unspecified place or not applicable: Secondary | ICD-10-CM | POA: Insufficient documentation

## 2016-03-06 DIAGNOSIS — W010XXA Fall on same level from slipping, tripping and stumbling without subsequent striking against object, initial encounter: Secondary | ICD-10-CM | POA: Diagnosis not present

## 2016-03-06 DIAGNOSIS — Y9301 Activity, walking, marching and hiking: Secondary | ICD-10-CM | POA: Diagnosis not present

## 2016-03-06 DIAGNOSIS — Z8679 Personal history of other diseases of the circulatory system: Secondary | ICD-10-CM | POA: Insufficient documentation

## 2016-03-06 DIAGNOSIS — M79641 Pain in right hand: Secondary | ICD-10-CM | POA: Diagnosis present

## 2016-03-06 DIAGNOSIS — S60221A Contusion of right hand, initial encounter: Secondary | ICD-10-CM

## 2016-03-06 MED ORDER — IBUPROFEN 800 MG PO TABS: 800.0000 mg | ORAL_TABLET | Freq: Three times a day (TID) | ORAL | Status: DC | PRN

## 2016-03-06 NOTE — ED Provider Notes (Signed)
[facility] Emergency Department Provider Note  ____________________________________________  Time seen: Approximately 12:15 PM  I have reviewed the triage vital signs and the nursing notes.   HISTORY  Chief Complaint Hand Pain    HPI Lisa Grimes is a 20 y.o. female    Past Medical History  Diagnosis Date  . Dye allergic reaction   . Rhinitis, allergic   . Abnormal white blood cell count   . Anxiety   . Depression with anxiety   . Epistaxis   . Influenza A   . Gastroesophageal reflux disease   . Screening for depression   . Hypoglycemia   . Insomnia   . Menstrual headache   . Acute bronchitis   . Palpitations   . Migraine headache   . Mild major depression (HCC)   . Viral syndrome   . Fatigue   . UTI (lower urinary tract infection)   . Major depressive disorder, recurrent episode, moderate with anxious distress (HCC)   . Parent-child relational problem   . Panic disorder with agoraphobia and severe panic attacks     Patient Active Problem List   Diagnosis Date Noted  . Abnormal WBC count 02/20/2015  . Allergic reaction to dye 02/20/2015  . Allergic rhinitis 02/20/2015  . Routine general medical examination at a health care facility 02/20/2015  . Anxiety 02/20/2015  . Family planning 02/20/2015  . Acute bronchitis 02/20/2015  . Anxiety and depression 02/20/2015  . Bleeding from the nose 02/20/2015  . Fatigue 02/20/2015  . Fever of undetermined origin 02/20/2015  . Acid reflux 02/20/2015  . Headache, menstrual migraine 02/20/2015  . Cardiac murmur 02/20/2015  . H/O cardiovascular disorder 02/20/2015  . Cannot sleep 02/20/2015  . Depression, major, recurrent, moderate (HCC) 02/20/2015  . Hypoglycemia 02/20/2015  . Adaptive colitis 02/20/2015  . Influenza due to influenza A virus 02/20/2015  . Received influenza vaccination at hospital 02/20/2015  . Need for vaccination for meningococcus 02/20/2015  . Headache, migraine  02/20/2015  . Mild major depression (HCC) 02/20/2015  . Need for vaccination 02/20/2015  . Flu vaccine need 02/20/2015  . Intermittent tremor 02/20/2015  . Awareness of heartbeats 02/20/2015  . Disease caused by virus 02/20/2015  . Lower urinary tract infection 02/20/2015  . Screening for depression 02/20/2015  . Screening examination for pulmonary tuberculosis 02/20/2015  . Counseling for guardian-child conflict 02/20/2015  . Panic disorder with agoraphobia and severe panic attacks 02/20/2015    Past Surgical History  Procedure Laterality Date  . Tonsillectomy  2003  . Mouth surgery  2010  . Tubes in ear  2000  . Strabismus surgery  1997  . Eye surgery      Current Outpatient Rx  Name  Route  Sig  Dispense  Refill  . ALPRAZolam (NIRAVAM) 0.5 MG dissolvable tablet   Oral   Take 1 tablet (0.5 mg total) by mouth every 8 (eight) hours as needed for anxiety.   90 tablet   1   . DULoxetine (CYMBALTA) 20 MG capsule   Oral   Take 1 capsule (20 mg total) by mouth daily.   30 capsule   5   . ibuprofen (ADVIL,MOTRIN) 800 MG tablet   Oral   Take 1 tablet (800 mg total) by mouth every 8 (eight) hours as needed.   30 tablet   0   . pantoprazole (PROTONIX) 40 MG tablet   Oral   Take 1 tablet (40 mg total) by mouth daily.   30 tablet  0     Allergies Penicillins and Red dye  Family History  Problem Relation Age of Onset  . Depression Mother   . Anxiety disorder Mother   . Alcohol abuse Father   . Bipolar disorder Maternal Aunt   . Bipolar disorder Maternal Uncle   . Bipolar disorder Paternal Uncle   . Depression Maternal Grandmother   . Depression Paternal Grandmother   . Bipolar disorder Paternal Grandmother   . ADD / ADHD Cousin   . Depression Cousin   . Heart attack Maternal Aunt   . Heart attack Maternal Uncle   . Stroke Maternal Grandmother   . Stroke Maternal Grandfather   . Hypertension Mother   . Hypertension Father   . Hypertension Maternal  Grandmother   . Hypertension Maternal Grandfather     Social History Social History  Substance Use Topics  . Smoking status: Never Smoker   . Smokeless tobacco: Never Used  . Alcohol Use: No    Review of Systems Constitutional: No fever/chills Eyes: No visual changes. ENT: No sore throat. Cardiovascular: Denies chest pain. Respiratory: Denies shortness of breath. Gastrointestinal: No abdominal pain.  No nausea, no vomiting.  No diarrhea.  No constipation. Genitourinary: Negative for dysuria. Musculoskeletal: Negative for back pain. Skin: Negative for rash. Neurological: Negative for headaches, focal weakness or numbness.  10-point ROS otherwise negative.  ____________________________________________   PHYSICAL EXAM:  VITAL SIGNS: ED Triage Vitals  Enc Vitals Group     BP 03/06/16 1149 122/80 mmHg     Pulse Rate 03/06/16 1149 85     Resp 03/06/16 1149 20     Temp 03/06/16 1149 98.6 F (37 C)     Temp Source 03/06/16 1149 Oral     SpO2 03/06/16 1149 95 %     Weight 03/06/16 1149 190 lb (86.183 kg)     Height 03/06/16 1149  (1.626 m)     Head Cir --      Peak Flow --      Pain Score 03/06/16 1149 8     Pain Loc --      Pain Edu? --      Excl. in GC? --     Constitutional: Alert and oriented. Well appearing and in no acute distress. Eyes: Conjunctivae are normal. PERRL. EOMI. Head: Atraumatic. Nose: No congestion/rhinnorhea. Mouth/Throat: Mucous membranes are moist.  Oropharynx non-erythematous. Neck: No stridor.   Cardiovascular: Normal rate, regular rhythm. Grossly normal heart sounds.  Good peripheral circulation. Respiratory: Normal respiratory effort.  No retractions. Lungs CTAB. Gastrointestinal: Soft and nontender. No distention. No abdominal bruits. No CVA tenderness. Musculoskeletal: No lower extremity tenderness nor edema.  No joint effusions. Neurologic:  Normal speech and language. No gross focal neurologic deficits are appreciated. No gait  instability. Skin:  Skin is warm, dry and intact. No rash noted. Psychiatric: Mood and affect are normal. Speech and behavior are normal.  ____________________________________________   LABS (all labs ordered are listed, but only abnormal results are displayed)  Labs Reviewed - No data to display ____________________________________________  EKG   ____________________________________________  RADIOLOGY  No osseous findings. ____________________________________________   PROCEDURES  Procedure(s) performed: None  Critical Care performed: No  ____________________________________________   INITIAL IMPRESSION / ASSESSMENT AND PLAN / ED COURSE  Pertinent labs & imaging results that were available during my care of the patient were reviewed by me and considered in my medical decision making (see chart for details).  Status post fall with right hand contusion. Rx given  for ibuprofen 800 mg 3 times a day soft Ace wrap provided for hand for comfort. Follow up PCP or return to ER with any worsening symptomology. ____________________________________________   FINAL CLINICAL IMPRESSION(S) / ED DIAGNOSES  Final diagnoses:  Hand contusion, right, initial encounter     This chart was dictated using voice recognition software/Dragon. Despite best efforts to proofread, errors can occur which can change the meaning. Any change was purely unintentional.   Evangeline Dakin, PA-C 03/06/16 1246  Sharman Cheek, MD 03/07/16 [phone]

## 2016-03-06 NOTE — Discharge Instructions (Signed)
Hand Contusion  A hand contusion is a deep bruise on your hand area. Contusions are the result of an injury that caused bleeding under the skin. The contusion may turn blue, purple, or yellow. Minor injuries will give you a painless contusion, but more severe contusions may stay painful and swollen for a few weeks.  CAUSES   A contusion is usually caused by a blow, trauma, or direct force to an area of the body.  SYMPTOMS    Swelling and redness of the injured area.   Discoloration of the injured area.   Tenderness and soreness of the injured area.   Pain.  DIAGNOSIS   The diagnosis can be made by taking a history and performing a physical exam. An X-ray, CT scan, or MRI may be needed to determine if there were any associated injuries, such as broken bones (fractures).  TREATMENT   Often, the best treatment for a hand contusion is resting, elevating, icing, and applying cold compresses to the injured area. Over-the-counter medicines may also be recommended for pain control.  HOME CARE INSTRUCTIONS    Put ice on the injured area.    Put ice in a plastic bag.    Place a towel between your skin and the bag.    Leave the ice on for 15-20 minutes, 03-04 times a day.   Only take over-the-counter or prescription medicines as directed by your caregiver. Your caregiver may recommend avoiding anti-inflammatory medicines (aspirin, ibuprofen, and naproxen) for 48 hours because these medicines may increase bruising.   If told, use an elastic wrap as directed. This can help reduce swelling. You may remove the wrap for sleeping, showering, and bathing. If your fingers become numb, cold, or blue, take the wrap off and reapply it more loosely.   Elevate your hand with pillows to reduce swelling.   Avoid overusing your hand if it is painful.  SEEK IMMEDIATE MEDICAL CARE IF:    You have increased redness, swelling, or pain in your hand.   Your swelling or pain is not relieved with medicines.   You have loss of feeling in  your hand or are unable to move your fingers.   Your hand turns cold or blue.   You have pain when you move your fingers.   Your hand becomes warm to the touch.   Your contusion does not improve in 2 days.  MAKE SURE YOU:    Understand these instructions.   Will watch your condition.   Will get help right away if you are not doing well or get worse.     This information is not intended to replace advice given to you by your health care provider. Make sure you discuss any questions you have with your health care provider.     Document Released: 04/19/2002 Document Revised: 07/22/2012 Document Reviewed: 04/20/2012  Elsevier Interactive Patient Education 2016 [employer]

## 2016-03-06 NOTE — ED Notes (Signed)
States she fell while walking the dog  Pain with some bruising and swelling to right hand near thumb area.  Good pulses and sensation

## 2016-03-06 NOTE — ED Notes (Signed)
Pt was walking dog last night, slipped in the rain, complains of right hand pain with swelling

## 2016-08-19 ENCOUNTER — Emergency Department (HOSPITAL_COMMUNITY): Payer: BLUE CROSS/BLUE SHIELD

## 2016-08-19 ENCOUNTER — Emergency Department (HOSPITAL_COMMUNITY)
Admission: EM | Admit: 2016-08-19 | Discharge: 2016-08-19 | Disposition: A | Discharge status: Home / Self Care | Payer: BLUE CROSS/BLUE SHIELD | Attending: Emergency Medicine | Admitting: Emergency Medicine

## 2016-08-19 ENCOUNTER — Encounter (HOSPITAL_COMMUNITY): Payer: Self-pay

## 2016-08-19 DIAGNOSIS — N898 Other specified noninflammatory disorders of vagina: Secondary | ICD-10-CM | POA: Insufficient documentation

## 2016-08-19 DIAGNOSIS — R1030 Lower abdominal pain, unspecified: Secondary | ICD-10-CM | POA: Diagnosis present

## 2016-08-19 DIAGNOSIS — N390 Urinary tract infection, site not specified: Secondary | ICD-10-CM | POA: Diagnosis not present

## 2016-08-19 LAB — COMPREHENSIVE METABOLIC PANEL
ALT: 12 U/L — AB (ref 14–54)
AST: 19 U/L (ref 15–41)
Albumin: 4 g/dL (ref 3.5–5.0)
Alkaline Phosphatase: 81 U/L (ref 38–126)
Anion gap: 9 (ref 5–15)
BILIRUBIN TOTAL: 0.6 mg/dL (ref 0.3–1.2)
BUN: 6 mg/dL (ref 6–20)
CHLORIDE: 106 mmol/L (ref 101–111)
CO2: 23 mmol/L (ref 22–32)
CREATININE: 0.76 mg/dL (ref 0.44–1.00)
Calcium: 9.6 mg/dL (ref 8.9–10.3)
Glucose, Bld: 111 mg/dL — ABNORMAL HIGH (ref 65–99)
Potassium: 3.9 mmol/L (ref 3.5–5.1)
Sodium: 138 mmol/L (ref 135–145)
TOTAL PROTEIN: 6.9 g/dL (ref 6.5–8.1)

## 2016-08-19 LAB — CBC
HCT: 42.8 % (ref 36.0–46.0)
Hemoglobin: 13.9 g/dL (ref 12.0–15.0)
MCH: 26.1 pg (ref 26.0–34.0)
MCHC: 32.5 g/dL (ref 30.0–36.0)
MCV: 80.3 fL (ref 78.0–100.0)
PLATELETS: 213 10*3/uL (ref 150–400)
RBC: 5.33 MIL/uL — AB (ref 3.87–5.11)
RDW: 12.9 % (ref 11.5–15.5)
WBC: 6.8 10*3/uL (ref 4.0–10.5)

## 2016-08-19 LAB — URINE MICROSCOPIC-ADD ON

## 2016-08-19 LAB — URINALYSIS, ROUTINE W REFLEX MICROSCOPIC
Glucose, UA: NEGATIVE mg/dL
Hgb urine dipstick: NEGATIVE
KETONES UR: 15 mg/dL — AB
NITRITE: POSITIVE — AB
PROTEIN: NEGATIVE mg/dL
Specific Gravity, Urine: 1.02 (ref 1.005–1.030)
pH: 6.5 (ref 5.0–8.0)

## 2016-08-19 LAB — WET PREP, GENITAL
Clue Cells Wet Prep HPF POC: NONE SEEN
SPERM: NONE SEEN
TRICH WET PREP: NONE SEEN
YEAST WET PREP: NONE SEEN

## 2016-08-19 LAB — PREGNANCY, URINE: Preg Test, Ur: NEGATIVE

## 2016-08-19 LAB — LIPASE, BLOOD: LIPASE: 18 U/L (ref 11–51)

## 2016-08-19 MED ORDER — SULFAMETHOXAZOLE-TRIMETHOPRIM 800-160 MG PO TABS: 1.0000 | ORAL_TABLET | Freq: Two times a day (BID) | ORAL | 0 refills | Status: DC

## 2016-08-19 MED ORDER — ONDANSETRON HCL 4 MG PO TABS: 4.0000 mg | ORAL_TABLET | Freq: Four times a day (QID) | ORAL | 0 refills | Status: DC

## 2016-08-19 NOTE — ED Provider Notes (Signed)
[facility]-EMERGENCY DEPT Provider Note   CSN: [id number] Arrival date & time: 08/19/16  1031     History   Chief Complaint Chief Complaint  Patient presents with  . Abdominal Pain    pt c/o abdominal pain and states that she has a bad kidney infection or bladder infection which she has had in the past     HPI Lisa Grimes is a 20 y.o. female.  HPI Patient presents with roughly 2 weeks of left flank pain. Denied any recent fever or chills. Developed dysuria, frequency and urgency over the last one day. Has had persistent vaginal discharge which is worsened over the last day as well. States she is sexually active with her husband only. Last menstrual period was 3 weeks ago. Has some mild lower abdominal pain, with nausea and one episode of vomiting. No diarrhea or constipation. Past Medical History:  Diagnosis Date  . Abnormal white blood cell count   . Acute bronchitis   . Anxiety   . Depression with anxiety   . Dye allergic reaction   . Epistaxis   . Fatigue   . Gastroesophageal reflux disease   . Hypoglycemia   . Influenza A   . Insomnia   . Major depressive disorder, recurrent episode, moderate with anxious distress (HCC)   . Menstrual headache   . Migraine headache   . Mild major depression (HCC)   . Palpitations   . Panic disorder with agoraphobia and severe panic attacks   . Parent-child relational problem   . Rhinitis, allergic   . Screening for depression   . UTI (lower urinary tract infection)   . Viral syndrome     Patient Active Problem List   Diagnosis Date Noted  . Abnormal WBC count 02/20/2015  . Allergic reaction to dye 02/20/2015  . Allergic rhinitis 02/20/2015  . Routine general medical examination at a health care facility 02/20/2015  . Anxiety 02/20/2015  . Family planning 02/20/2015  . Acute bronchitis 02/20/2015  . Anxiety and depression 02/20/2015  . Bleeding from the nose 02/20/2015  . Fatigue 02/20/2015  . Fever of undetermined origin  02/20/2015  . Acid reflux 02/20/2015  . Headache, menstrual migraine 02/20/2015  . Cardiac murmur 02/20/2015  . H/O cardiovascular disorder 02/20/2015  . Cannot sleep 02/20/2015  . Depression, major, recurrent, moderate (HCC) 02/20/2015  . Hypoglycemia 02/20/2015  . Adaptive colitis 02/20/2015  . Influenza due to influenza A virus 02/20/2015  . Received influenza vaccination at hospital 02/20/2015  . Need for vaccination for meningococcus 02/20/2015  . Headache, migraine 02/20/2015  . Mild major depression (HCC) 02/20/2015  . Need for vaccination 02/20/2015  . Flu vaccine need 02/20/2015  . Intermittent tremor 02/20/2015  . Awareness of heartbeats 02/20/2015  . Disease caused by virus 02/20/2015  . Lower urinary tract infection 02/20/2015  . Screening for depression 02/20/2015  . Screening examination for pulmonary tuberculosis 02/20/2015  . Counseling for guardian-child conflict 02/20/2015  . Panic disorder with agoraphobia and severe panic attacks 02/20/2015    Past Surgical History:  Procedure Laterality Date  . EYE SURGERY    . MOUTH SURGERY  2010  . STRABISMUS SURGERY  1997  . TONSILLECTOMY  2003  . tubes in ear  2000    OB History    No data available       Home Medications    Prior to Admission medications   Medication Sig Start Date End Date Taking? Authorizing Provider  ALPRAZolam (NIRAVAM) 0.5 MG dissolvable tablet  Take 1 tablet (0.5 mg total) by mouth every 8 (eight) hours as needed for anxiety. 01/29/16   Richard Hulen ShoutsL Gilbert Jr., MD  DULoxetine (CYMBALTA) 20 MG capsule Take 1 capsule (20 mg total) by mouth daily. 01/29/16   Richard Hulen ShoutsL Gilbert Jr., MD  ibuprofen (ADVIL,MOTRIN) 800 MG tablet Take 1 tablet (800 mg total) by mouth every 8 (eight) hours as needed. 03/06/16   Charmayne Sheerharles M Beers, PA-C  ondansetron (ZOFRAN) 4 MG tablet Take 1 tablet (4 mg total) by mouth every 6 (six) hours. 08/19/16   Loren Raceravid Ilisa Hayworth, MD  pantoprazole (PROTONIX) 40 MG tablet Take 1 tablet  (40 mg total) by mouth daily. 09/26/15   Seeplaputhur Wynona LunaG Sankar, MD  sulfamethoxazole-trimethoprim (BACTRIM DS,SEPTRA DS) 800-160 MG tablet Take 1 tablet by mouth 2 (two) times daily. 08/19/16 08/26/16  Loren Raceravid Tocara Mennen, MD    Family History Family History  Problem Relation Age of Onset  . Depression Mother   . Anxiety disorder Mother   . Hypertension Mother   . Alcohol abuse Father   . Hypertension Father   . Bipolar disorder Maternal Aunt   . Bipolar disorder Maternal Uncle   . Bipolar disorder Paternal Uncle   . Depression Maternal Grandmother   . Stroke Maternal Grandmother   . Hypertension Maternal Grandmother   . Depression Paternal Grandmother   . Bipolar disorder Paternal Grandmother   . ADD / ADHD Cousin   . Depression Cousin   . Heart attack Maternal Aunt   . Heart attack Maternal Uncle   . Stroke Maternal Grandfather   . Hypertension Maternal Grandfather     Social History Social History  Substance Use Topics  . Smoking status: Never Smoker  . Smokeless tobacco: Never Used  . Alcohol use No     Allergies   Penicillins and Red dye   Review of Systems Review of Systems  Constitutional: Negative for chills and fever.  Respiratory: Negative for shortness of breath.   Cardiovascular: Negative for chest pain.  Gastrointestinal: Positive for abdominal pain, nausea and vomiting. Negative for constipation and diarrhea.  Genitourinary: Positive for dysuria, flank pain, frequency, urgency and vaginal discharge. Negative for difficulty urinating, hematuria, pelvic pain and vaginal bleeding.  Musculoskeletal: Positive for back pain. Negative for myalgias, neck pain and neck stiffness.  Skin: Negative for rash and wound.  Neurological: Negative for dizziness, weakness, light-headedness, numbness and headaches.  All other systems reviewed and are negative.    Physical Exam Updated Vital Signs BP 139/81   Pulse 87   Temp 98.4 F (36.9 C) (Oral)   Resp 16   Ht 5'  4" (1.626 m)   Wt 189 lb (85.7 kg)   LMP 07/29/2016   SpO2 100%   BMI 32.44 kg/m   Physical Exam  Constitutional: She is oriented to person, place, and time. She appears well-developed and well-nourished.  HENT:  Head: Normocephalic and atraumatic.  Mouth/Throat: Oropharynx is clear and moist.  Eyes: EOM are normal. Pupils are equal, round, and reactive to light.  Neck: Normal range of motion. Neck supple.  Cardiovascular: Normal rate and regular rhythm.   Pulmonary/Chest: Effort normal and breath sounds normal.  Abdominal: Soft. Bowel sounds are normal. There is tenderness (mild suprapubic tenderness to palpation. No rebound or guarding.). There is no rebound and no guarding.  Genitourinary: Vaginal discharge found.  Genitourinary Comments: Clear discharge. No cervical motion tenderness. Mild suprapubic tenderness to palpation.  Musculoskeletal: Normal range of motion. She exhibits no edema or tenderness.  Minimal  left CVA tenderness. Patient also has mild left paraspinal lumbar tenderness to palpation. No midline thoracic or lumbar tenderness. No lower extremity swelling or asymmetry.  Neurological: She is alert and oriented to person, place, and time.  Moves all extremities without deficit. Sensation is fully intact.  Skin: Skin is warm and dry. Capillary refill takes less than 2 seconds. No rash noted. No erythema.  Psychiatric: She has a normal mood and affect. Her behavior is normal.  Nursing note and vitals reviewed.    ED Treatments / Results  Labs (all labs ordered are listed, but only abnormal results are displayed) Labs Reviewed  WET PREP, GENITAL - Abnormal; Notable for the following:       Result Value   WBC, Wet Prep HPF POC MODERATE (*)    All other components within normal limits  COMPREHENSIVE METABOLIC PANEL - Abnormal; Notable for the following:    Glucose, Bld 111 (*)    ALT 12 (*)    All other components within normal limits  CBC - Abnormal; Notable for  the following:    RBC 5.33 (*)    All other components within normal limits  URINALYSIS, ROUTINE W REFLEX MICROSCOPIC (NOT AT [facility]) - Abnormal; Notable for the following:    Color, Urine AMBER (*)    Bilirubin Urine SMALL (*)    Ketones, ur 15 (*)    Nitrite POSITIVE (*)    Leukocytes, UA MODERATE (*)    All other components within normal limits  URINE MICROSCOPIC-ADD ON - Abnormal; Notable for the following:    Squamous Epithelial / LPF 6-30 (*)    Bacteria, UA MANY (*)    All other components within normal limits  LIPASE, BLOOD  PREGNANCY, URINE  GC/CHLAMYDIA PROBE AMP ([place]) NOT AT [facility]    EKG  EKG Interpretation None       Radiology US Renal  Result Date: 08/19/2016 CLINICAL DATA:  Left flank pain EXAM: RENAL / URINARY TRACT ULTRASOUND COMPLETE COMPARISON:  Ultrasound abdomen 09/19/2015 FINDINGS: Right Kidney: Length: 10.2 cm. Echogenicity within normal limits. No mass or hydronephrosis visualized. Left Kidney: Length: 10.9 cm. Echogenicity within normal limits. No mass or hydronephrosis visualized. Bladder: Appears normal for degree of bladder distention. IMPRESSION: Negative renal ultrasound Electronically Signed   By: Marlan Palau M.D.   On: 08/19/2016 13:31    Procedures Procedures (including critical care time)  Medications Ordered in ED Medications - No data to display   Initial Impression / Assessment and Plan / ED Course  I have reviewed the triage vital signs and the nursing notes.  Pertinent labs & imaging results that were available during my care of the patient were reviewed by me and considered in my medical decision making (see chart for details).  Clinical Course   Symptoms consistent with UTI. Low suspicion for pyelonephritis. Ultrasound without any evidence of abnormality. Patient says she is taking Bactrim in the past for her urinary tract infections. Patient does have many white blood cells on her wet prep. No cervical motion tenderness.  Will not treat for STI and await culture results. Return precautions given   Final Clinical Impressions(s) / ED Diagnoses   Final diagnoses:  Lower urinary tract infectious disease    New Prescriptions Discharge Medication List as of 08/19/2016  3:08 PM    START taking these medications   Details  sulfamethoxazole-trimethoprim (BACTRIM DS,SEPTRA DS) 800-160 MG tablet Take 1 tablet by mouth 2 (two) times daily., Starting Mon 08/19/2016, Until Mon 08/26/2016,  Print         Loren Racer, MD 08/20/16 [phone]

## 2016-08-19 NOTE — ED Triage Notes (Signed)
Pt states that she has had bladder infections in the past and that she was starting to notice s/s for this on Friday

## 2016-08-19 NOTE — ED Notes (Signed)
Pelvic cart at bedside. 

## 2016-08-20 LAB — GC/CHLAMYDIA PROBE AMP (~~LOC~~) NOT AT ARMC
Chlamydia: NEGATIVE
Neisseria Gonorrhea: NEGATIVE

## 2016-08-21 ENCOUNTER — Encounter: Payer: Self-pay | Admitting: Medical Oncology

## 2016-08-21 ENCOUNTER — Emergency Department
Admission: EM | Admit: 2016-08-21 | Discharge: 2016-08-21 | Disposition: A | Discharge status: Home / Self Care | Payer: BLUE CROSS/BLUE SHIELD | Attending: Student | Admitting: Student

## 2016-08-21 DIAGNOSIS — N10 Acute tubulo-interstitial nephritis: Secondary | ICD-10-CM

## 2016-08-21 DIAGNOSIS — Z79899 Other long term (current) drug therapy: Secondary | ICD-10-CM | POA: Diagnosis not present

## 2016-08-21 DIAGNOSIS — R300 Dysuria: Secondary | ICD-10-CM | POA: Diagnosis present

## 2016-08-21 LAB — URINALYSIS COMPLETE WITH MICROSCOPIC (ARMC ONLY)
BACTERIA UA: NONE SEEN
Specific Gravity, Urine: 1.014 (ref 1.005–1.030)

## 2016-08-21 LAB — POCT PREGNANCY, URINE: PREG TEST UR: NEGATIVE

## 2016-08-21 MED ORDER — LIDOCAINE HCL (PF) 1 % IJ SOLN
2.1000 mL | Freq: Once | INTRAMUSCULAR | Status: AC
  Administered 2016-08-21: 2.1 mL
  Filled 2016-08-21: qty 5

## 2016-08-21 MED ORDER — CEFTRIAXONE SODIUM 1 G IJ SOLR
1.0000 g | Freq: Once | INTRAMUSCULAR | Status: AC
  Administered 2016-08-21: 1 g via INTRAMUSCULAR
  Filled 2016-08-21: qty 10

## 2016-08-21 MED ORDER — CIPROFLOXACIN HCL 500 MG PO TABS: 500.0000 mg | ORAL_TABLET | Freq: Two times a day (BID) | ORAL | 0 refills | Status: AC

## 2016-08-21 NOTE — ED Notes (Signed)
States she was seen on Sunday for UTI sx's  States she feels like sx's area worse  Having more nausea  And increased flank pain

## 2016-08-21 NOTE — Discharge Instructions (Signed)
Follow up with your primary care provider in 1 week for a recheck of your urine. Return to the ER for fever, increase in back pain, unable to keep food or fluids down, or other concerns.

## 2016-08-21 NOTE — ED Provider Notes (Signed)
[facility] Emergency Department Provider Note  ____________________________________________  Time seen: Approximately 2:52 PM  I have reviewed the triage vital signs and the nursing notes.   HISTORY  Chief Complaint Recurrent UTI    HPI Lisa Grimes is a 20 y.o. female who presents with dysuria and right sided back pain that has worsened since being seen and treated for UTI at [facility] ED on Monday. Patient reports she was given prescription for bactrim and has had 4 doses of this. Reports worsening symptoms, and onset of blood in urine. Patient has a history of UTIs, but states this is the worst one she's ever had. Reports feeling like she's had fevers, but has not had one when she checked it. Patient also experiencing nausea and vomiting. Patient denies SOB, chest pain, abdominal pain, diarrhea. Patient denies new sexual partners.   Past Medical History:  Diagnosis Date  . Abnormal white blood cell count   . Acute bronchitis   . Anxiety   . Depression with anxiety   . Dye allergic reaction   . Epistaxis   . Fatigue   . Gastroesophageal reflux disease   . Hypoglycemia   . Influenza A   . Insomnia   . Major depressive disorder, recurrent episode, moderate with anxious distress (HCC)   . Menstrual headache   . Migraine headache   . Mild major depression (HCC)   . Palpitations   . Panic disorder with agoraphobia and severe panic attacks   . Parent-child relational problem   . Rhinitis, allergic   . Screening for depression   . UTI (lower urinary tract infection)   . Viral syndrome     Patient Active Problem List   Diagnosis Date Noted  . Abnormal WBC count 02/20/2015  . Allergic reaction to dye 02/20/2015  . Allergic rhinitis 02/20/2015  . Routine general medical examination at a health care facility 02/20/2015  . Anxiety 02/20/2015  . Family planning 02/20/2015  . Acute bronchitis 02/20/2015  . Anxiety and depression 02/20/2015  .  Bleeding from the nose 02/20/2015  . Fatigue 02/20/2015  . Fever of undetermined origin 02/20/2015  . Acid reflux 02/20/2015  . Headache, menstrual migraine 02/20/2015  . Cardiac murmur 02/20/2015  . H/O cardiovascular disorder 02/20/2015  . Cannot sleep 02/20/2015  . Depression, major, recurrent, moderate (HCC) 02/20/2015  . Hypoglycemia 02/20/2015  . Adaptive colitis 02/20/2015  . Influenza due to influenza A virus 02/20/2015  . Received influenza vaccination at hospital 02/20/2015  . Need for vaccination for meningococcus 02/20/2015  . Headache, migraine 02/20/2015  . Mild major depression (HCC) 02/20/2015  . Need for vaccination 02/20/2015  . Flu vaccine need 02/20/2015  . Intermittent tremor 02/20/2015  . Awareness of heartbeats 02/20/2015  . Disease caused by virus 02/20/2015  . Lower urinary tract infection 02/20/2015  . Screening for depression 02/20/2015  . Screening examination for pulmonary tuberculosis 02/20/2015  . Counseling for guardian-child conflict 02/20/2015  . Panic disorder with agoraphobia and severe panic attacks 02/20/2015    Past Surgical History:  Procedure Laterality Date  . EYE SURGERY    . MOUTH SURGERY  2010  . STRABISMUS SURGERY  1997  . TONSILLECTOMY  2003  . tubes in ear  2000    Prior to Admission medications   Medication Sig Start Date End Date Taking? Authorizing Provider  ALPRAZolam (NIRAVAM) 0.5 MG dissolvable tablet Take 1 tablet (0.5 mg total) by mouth every 8 (eight) hours as needed for anxiety. 01/29/16  Richard Hulen Shouts., MD  ciprofloxacin (CIPRO) 500 MG tablet Take 1 tablet (500 mg total) by mouth 2 (two) times daily. 08/21/16 08/24/16  Chinita Pester, FNP  DULoxetine (CYMBALTA) 20 MG capsule Take 1 capsule (20 mg total) by mouth daily. 01/29/16   Richard Hulen Shouts., MD  ibuprofen (ADVIL,MOTRIN) 800 MG tablet Take 1 tablet (800 mg total) by mouth every 8 (eight) hours as needed. 03/06/16   Charmayne Sheer Beers, PA-C  ondansetron  (ZOFRAN) 4 MG tablet Take 1 tablet (4 mg total) by mouth every 6 (six) hours. 08/19/16   Loren Racer, MD  pantoprazole (PROTONIX) 40 MG tablet Take 1 tablet (40 mg total) by mouth daily. 09/26/15   Seeplaputhur Wynona Luna, MD  sulfamethoxazole-trimethoprim (BACTRIM DS,SEPTRA DS) 800-160 MG tablet Take 1 tablet by mouth 2 (two) times daily. 08/19/16 08/26/16  Loren Racer, MD    Allergies Penicillins and Red dye  Family History  Problem Relation Age of Onset  . Depression Mother   . Anxiety disorder Mother   . Hypertension Mother   . Alcohol abuse Father   . Hypertension Father   . Bipolar disorder Maternal Aunt   . Bipolar disorder Maternal Uncle   . Bipolar disorder Paternal Uncle   . Depression Maternal Grandmother   . Stroke Maternal Grandmother   . Hypertension Maternal Grandmother   . Depression Paternal Grandmother   . Bipolar disorder Paternal Grandmother   . ADD / ADHD Cousin   . Depression Cousin   . Heart attack Maternal Aunt   . Heart attack Maternal Uncle   . Stroke Maternal Grandfather   . Hypertension Maternal Grandfather     Social History Social History  Substance Use Topics  . Smoking status: Never Smoker  . Smokeless tobacco: Never Used  . Alcohol use No    Review of Systems Constitutional: Unknown for fever. Cardiovascular: Negative for chest pain. Respiratory: Negative for shortness of breath or cough. Gastrointestinal: Negative for abdominal pain; Positive for nausea and vomiting. Negative for diarrhea. Genitourinary: Positive for dysuria and hematuria, Negative for vaginal discharge. Musculoskeletal: Positive for back pain worse on the right side. Skin: Negative for rash or bruising. ____________________________________________   PHYSICAL EXAM:  VITAL SIGNS: ED Triage Vitals  Enc Vitals Group     BP 08/21/16 1416 (!) 156/73     Pulse Rate 08/21/16 1416 94     Resp 08/21/16 1416 18     Temp 08/21/16 1416 98.7 F (37.1 C)     Temp  Source 08/21/16 1416 Oral     SpO2 08/21/16 1416 98 %     Weight 08/21/16 1417 189 lb (85.7 kg)     Height 08/21/16 1417 5\' 4"  (1.626 m)     Head Circumference --      Peak Flow --      Pain Score 08/21/16 1423 9     Pain Loc --      Pain Edu? --      Excl. in GC? --     Constitutional: Alert and oriented. Well appearing and in no acute distress. Eyes: Conjunctivae are normal.  Head: Atraumatic. Mouth/Throat: Mucous membranes are moist. Cardiovascular: Normal rate and rhythm, normal s1 s2. Good peripheral pulses.  Respiratory: Normal respiratory effort.  No retractions. No wheezes/rales/rhonchi. Gastrointestinal: Soft, without distention. Mildly tender to palpation of suprapubic region. Normoactive bowel sounds. Mild CVA tenderness on the right.  Genitourinary: Pelvic exam: deferred. Musculoskeletal: No extremity tenderness nor edema.  Neurologic:  Normal speech  and language. No gross focal neurologic deficits are appreciated. Speech is normal.  Skin:  Skin is warm, dry and intact. No rash or bruising noted. Psychiatric: Mood and affect are normal. Speech and behavior are normal.  ____________________________________________   LABS (all labs ordered are listed, but only abnormal results are displayed)  Labs Reviewed  URINALYSIS COMPLETEWITH MICROSCOPIC ([facility] ONLY) - Abnormal; Notable for the following:       Result Value   Color, Urine ORANGE (*)    APPearance HAZY (*)    Glucose, UA   (*)    Value: TEST NOT REPORTED DUE TO COLOR INTERFERENCE OF URINE PIGMENT   Bilirubin Urine   (*)    Value: TEST NOT REPORTED DUE TO COLOR INTERFERENCE OF URINE PIGMENT   Ketones, ur   (*)    Value: TEST NOT REPORTED DUE TO COLOR INTERFERENCE OF URINE PIGMENT   Hgb urine dipstick   (*)    Value: TEST NOT REPORTED DUE TO COLOR INTERFERENCE OF URINE PIGMENT   Protein, ur   (*)    Value: TEST NOT REPORTED DUE TO COLOR INTERFERENCE OF URINE PIGMENT   Nitrite   (*)    Value: TEST NOT REPORTED  DUE TO COLOR INTERFERENCE OF URINE PIGMENT   Leukocytes, UA   (*)    Value: TEST NOT REPORTED DUE TO COLOR INTERFERENCE OF URINE PIGMENT   Squamous Epithelial / LPF 0-5 (*)    All other components within normal limits  URINE CULTURE  POC URINE PREG, ED  POCT PREGNANCY, URINE   ____________________________________________  RADIOLOGY  None. ____________________________________________   PROCEDURES  Procedure(s) performed: None.  ____________________________________________   INITIAL IMPRESSION / ASSESSMENT AND PLAN / ED COURSE  Clinical Course     Pertinent labs & imaging results that were available during my care of the patient were reviewed by me and considered in my medical decision making (see chart for details).   Patient presentation consistent with pyelonephritis. Given 1 g Rocephin IM in ED. Patient will be given prescriptions for Ciprofloxacin today. She was advised to follow up with her PCP for symptoms that are not improving over the next 2-3 days. She was advised to return to the ER for symptoms that change or worsen if unable to schedule an appointment. No other emergency medicine complaints at this time.  ____________________________________________   FINAL CLINICAL IMPRESSION(S) / ED DIAGNOSES  Final diagnoses:  Pyelonephritis, acute    Note:  This document was prepared using Dragon voice recognition software and may include unintentional dictation errors.    Chinita PesterCari B Isaiyah Feldhaus, FNP 08/21/16 1922    Gayla DossEryka A Gayle, MD 08/21/16 2328

## 2016-08-21 NOTE — ED Triage Notes (Signed)
Pt reports she has been having sx's of uti since Sunday, went to [facility] and was given abx. Pt reports no improvement of sx's.

## 2016-08-23 ENCOUNTER — Ambulatory Visit (INDEPENDENT_AMBULATORY_CARE_PROVIDER_SITE_OTHER): Payer: BLUE CROSS/BLUE SHIELD | Admitting: Family Medicine

## 2016-08-23 ENCOUNTER — Encounter: Payer: Self-pay | Admitting: Family Medicine

## 2016-08-23 VITALS — BP 112/78 | HR 72 | Temp 98.1°F | Resp 16 | Wt 189.0 lb

## 2016-08-23 DIAGNOSIS — R319 Hematuria, unspecified: Secondary | ICD-10-CM | POA: Diagnosis not present

## 2016-08-23 DIAGNOSIS — N390 Urinary tract infection, site not specified: Secondary | ICD-10-CM

## 2016-08-23 DIAGNOSIS — N12 Tubulo-interstitial nephritis, not specified as acute or chronic: Secondary | ICD-10-CM | POA: Diagnosis not present

## 2016-08-23 LAB — POCT URINALYSIS DIPSTICK
Bilirubin, UA: NEGATIVE
Glucose, UA: NEGATIVE
KETONES UA: NEGATIVE
LEUKOCYTES UA: NEGATIVE
Nitrite, UA: NEGATIVE
PH UA: 6
PROTEIN UA: NEGATIVE
Spec Grav, UA: 1.01
Urobilinogen, UA: NEGATIVE

## 2016-08-23 LAB — URINE CULTURE: SPECIAL REQUESTS: NORMAL

## 2016-08-23 MED ORDER — CEFTRIAXONE SODIUM 1 G IJ SOLR
1.0000 g | Freq: Once | INTRAMUSCULAR | Status: AC
  Administered 2016-08-23: 1 g via INTRAMUSCULAR

## 2016-08-23 NOTE — Progress Notes (Signed)
Patient: Lisa Grimes Female    DOB: [birth year]   20 y.o.   MRN: [id number] Visit Date: 08/23/2016  Today's Provider: Mila Merry, MD   Chief Complaint  Patient presents with  . Urinary Tract Infection   Subjective:    HPI  She was initially seen in ER on 08/19/2016 and started on Septra DS for UTI. She worsened and returned on 08/21/2016 and treated for pyelonephritis with parenteral ceftriaxone and prescribed oral Cipro. She had also been having nausea and vomiting and prescribed Zofran. She remains nauseated, but has had no more vomiting and is keeping down cipro. She states she is drinking a lot of water. She states she continues to feel very weak and nauseated, with bilateral back pain and dysuria. Urine culture from 08-21-2016 grew e.coli sensitive to ceftriaxone and Cipro.     Allergies  Allergen Reactions  . Penicillins Hives  . Red Dye     possible allergy     Current Outpatient Prescriptions:  .  ALPRAZolam (NIRAVAM) 0.5 MG dissolvable tablet, Take 1 tablet (0.5 mg total) by mouth every 8 (eight) hours as needed for anxiety., Disp: 90 tablet, Rfl: 1 .  ciprofloxacin (CIPRO) 500 MG tablet, Take 1 tablet (500 mg total) by mouth 2 (two) times daily., Disp: 20 tablet, Rfl: 0 .  ondansetron (ZOFRAN) 4 MG tablet, Take 1 tablet (4 mg total) by mouth every 6 (six) hours., Disp: 12 tablet, Rfl: 0 .  pantoprazole (PROTONIX) 40 MG tablet, Take 1 tablet (40 mg total) by mouth daily., Disp: 30 tablet, Rfl: 0 .  DULoxetine (CYMBALTA) 20 MG capsule, Take 1 capsule (20 mg total) by mouth daily. (Patient not taking: Reported on 08/23/2016), Disp: 30 capsule, Rfl: 5 .  ibuprofen (ADVIL,MOTRIN) 800 MG tablet, Take 1 tablet (800 mg total) by mouth every 8 (eight) hours as needed. (Patient not taking: Reported on 08/23/2016), Disp: 30 tablet, Rfl: 0  Review of Systems  Constitutional: Positive for chills and fatigue. Negative for diaphoresis and fever.  Gastrointestinal: Positive  for nausea. Negative for abdominal pain, blood in stool, diarrhea and vomiting.  Genitourinary: Positive for dysuria, flank pain, frequency and urgency. Negative for difficulty urinating and hematuria.    Social History  Substance Use Topics  . Smoking status: Never Smoker  . Smokeless tobacco: Never Used  . Alcohol use No   Objective:   BP 112/78   Temp 98.1 F (36.7 C)   Resp 16   Wt 189 lb (85.7 kg)   LMP 07/29/2016   BMI 32.44 kg/m   Physical Exam  General Appearance:    Alert, cooperative, no distress  Eyes:    PERRL, conjunctiva/corneas clear, EOM's intact       Lungs:     Clear to auscultation bilaterally, respirations unlabored  Heart:    Regular rate and rhythm  Abdomen:   bowel sounds present and normal in all 4 quadrants, soft, round, nontender or nondistended. CVA tenderness is present bilaterally, Abdominal signs: Murphy's sign absent, psoas sign absent, obturator sign absent     Results for orders placed or performed during the hospital encounter of 08/21/16  Urine culture  Result Value Ref Range   Specimen Description URINE, CLEAN CATCH    Special Requests Normal    Culture 80,000 COLONIES/mL ESCHERICHIA COLI (A)    Report Status 08/23/2016 FINAL    Organism ID, Bacteria ESCHERICHIA COLI (A)       Susceptibility   Escherichia coli - MIC*  AMPICILLIN >=32 RESISTANT Resistant     CEFAZOLIN <=4 SENSITIVE Sensitive     CEFTRIAXONE <=1 SENSITIVE Sensitive     CIPROFLOXACIN <=0.25 SENSITIVE Sensitive     GENTAMICIN <=1 SENSITIVE Sensitive     IMIPENEM <=0.25 SENSITIVE Sensitive     NITROFURANTOIN <=16 SENSITIVE Sensitive     TRIMETH/SULFA >=320 RESISTANT Resistant     AMPICILLIN/SULBACTAM 16 INTERMEDIATE Intermediate     PIP/TAZO <=4 SENSITIVE Sensitive     Extended ESBL NEGATIVE Sensitive     * 80,000 COLONIES/mL ESCHERICHIA COLI  Urinalysis complete, with microscopic ([facility] only)  Result Value Ref Range   Color, Urine ORANGE (A) YELLOW   APPearance  HAZY (A) CLEAR   Glucose, UA (A) NEGATIVE mg/dL    TEST NOT REPORTED DUE TO COLOR INTERFERENCE OF URINE PIGMENT   Bilirubin Urine (A) NEGATIVE    TEST NOT REPORTED DUE TO COLOR INTERFERENCE OF URINE PIGMENT   Ketones, ur (A) NEGATIVE mg/dL    TEST NOT REPORTED DUE TO COLOR INTERFERENCE OF URINE PIGMENT   Specific Gravity, Urine 1.014 1.005 - 1.030   Hgb urine dipstick (A) NEGATIVE    TEST NOT REPORTED DUE TO COLOR INTERFERENCE OF URINE PIGMENT   pH  5.0 - 8.0    TEST NOT REPORTED DUE TO COLOR INTERFERENCE OF URINE PIGMENT   Protein, ur (A) NEGATIVE mg/dL    TEST NOT REPORTED DUE TO COLOR INTERFERENCE OF URINE PIGMENT   Nitrite (A) NEGATIVE    TEST NOT REPORTED DUE TO COLOR INTERFERENCE OF URINE PIGMENT   Leukocytes, UA (A) NEGATIVE    TEST NOT REPORTED DUE TO COLOR INTERFERENCE OF URINE PIGMENT   RBC / HPF 0-5 0 - 5 RBC/hpf   WBC, UA 6-30 0 - 5 WBC/hpf   Bacteria, UA NONE SEEN NONE SEEN   Squamous Epithelial / LPF 0-5 (A) NONE SEEN   Mucous PRESENT   Pregnancy, urine POC  Result Value Ref Range   Preg Test, Ur NEGATIVE NEGATIVE      Results for orders placed or performed in visit on 08/23/16  POCT urinalysis dipstick  Result Value Ref Range   Color, UA clear    Clarity, UA clear    Glucose, UA negative    Bilirubin, UA negative    Ketones, UA negative    Spec Grav, UA 1.010    Blood, UA non hemolyzed moderate    pH, UA 6.0    Protein, UA negative    Urobilinogen, UA negative    Nitrite, UA negative    Leukocytes, UA Negative Negative       Assessment & Plan:     1. Urinary tract infection with hematuria, site unspecified  - POCT urinalysis dipstick - Urine culture  2. Pyelonephritis Slight improvement in symptoms since ER visit of 10/11. She is able to keep down Cipro. She still has hematuria. Given injection Rocephin today and is to continue Cipro. Reculture urine.  - cefTRIAXone (ROCEPHIN) injection 1 g; Inject 1 g into the muscle once.       Lisa Merryonald  Sible Straley, MD  [facility]

## 2016-08-23 NOTE — Progress Notes (Signed)
       Patient: Lisa Grimes Female    DOB: 05/15/1996   20 y.o.   MRN: [id number] Visit Date: 08/23/2016  Today's Provider: Mila Merryonald Fisher, MD   Chief Complaint  Patient presents with  . Hospitalization Follow-up  . Urinary Tract Infection   Subjective:    HPI  Follow up Hospitalization  Patient was [facility] ER on 08/21/2016 and discharged on 08/21/2016. She was treated for pylonephritis. Treatment for this included rocephin injection and cipro orally. She reports good compliance with treatment. She reports this condition is Worse. Patient reports that she is still having lower back pain and burning on urination. Patient denies any fever, but she has felt feverish.         Allergies  Allergen Reactions  . Penicillins Hives  . Red Dye     possible allergy     Current Outpatient Prescriptions:  .  ALPRAZolam (NIRAVAM) 0.5 MG dissolvable tablet, Take 1 tablet (0.5 mg total) by mouth every 8 (eight) hours as needed for anxiety., Disp: 90 tablet, Rfl: 1 .  ciprofloxacin (CIPRO) 500 MG tablet, Take 1 tablet (500 mg total) by mouth 2 (two) times daily., Disp: 20 tablet, Rfl: 0 .  ondansetron (ZOFRAN) 4 MG tablet, Take 1 tablet (4 mg total) by mouth every 6 (six) hours., Disp: 12 tablet, Rfl: 0 .  pantoprazole (PROTONIX) 40 MG tablet, Take 1 tablet (40 mg total) by mouth daily., Disp: 30 tablet, Rfl: 0 .  DULoxetine (CYMBALTA) 20 MG capsule, Take 1 capsule (20 mg total) by mouth daily. (Patient not taking: Reported on 08/23/2016), Disp: 30 capsule, Rfl: 5 .  ibuprofen (ADVIL,MOTRIN) 800 MG tablet, Take 1 tablet (800 mg total) by mouth every 8 (eight) hours as needed. (Patient not taking: Reported on 08/23/2016), Disp: 30 tablet, Rfl: 0  Review of Systems  Constitutional: Positive for chills, fatigue and fever.  Respiratory: Negative.   Gastrointestinal: Positive for nausea.  Genitourinary: Positive for difficulty urinating, dysuria, flank pain, frequency and  urgency. Negative for decreased urine volume, hematuria, pelvic pain, vaginal bleeding, vaginal discharge and vaginal pain.  Musculoskeletal: Positive for myalgias.  Skin: Negative.   Neurological: Positive for light-headedness and headaches.    Social History  Substance Use Topics  . Smoking status: Never Smoker  . Smokeless tobacco: Never Used  . Alcohol use No   Objective:   BP 112/78 (BP Location: Left Arm, Patient Position: Sitting, Cuff Size: Normal)   Pulse 72   Temp 98.1 F (36.7 C)   Resp 16   Wt 189 lb (85.7 kg)   LMP 07/29/2016   BMI 32.44 kg/m   Physical Exam      Assessment & Plan:           Mila Merryonald Fisher, MD  [facility]

## 2016-08-24 LAB — PLEASE NOTE

## 2016-08-24 LAB — URINE CULTURE: ORGANISM ID, BACTERIA: NO GROWTH

## 2016-08-26 ENCOUNTER — Emergency Department
Admission: EM | Admit: 2016-08-26 | Discharge: 2016-08-26 | Disposition: A | Discharge status: Home / Self Care | Payer: BLUE CROSS/BLUE SHIELD | Attending: Emergency Medicine | Admitting: Emergency Medicine

## 2016-08-26 DIAGNOSIS — R197 Diarrhea, unspecified: Secondary | ICD-10-CM | POA: Diagnosis not present

## 2016-08-26 DIAGNOSIS — Z791 Long term (current) use of non-steroidal anti-inflammatories (NSAID): Secondary | ICD-10-CM | POA: Diagnosis not present

## 2016-08-26 DIAGNOSIS — R1030 Lower abdominal pain, unspecified: Secondary | ICD-10-CM | POA: Diagnosis not present

## 2016-08-26 DIAGNOSIS — Z79899 Other long term (current) drug therapy: Secondary | ICD-10-CM | POA: Diagnosis not present

## 2016-08-26 DIAGNOSIS — M549 Dorsalgia, unspecified: Secondary | ICD-10-CM | POA: Diagnosis not present

## 2016-08-26 DIAGNOSIS — J111 Influenza due to unidentified influenza virus with other respiratory manifestations: Secondary | ICD-10-CM

## 2016-08-26 DIAGNOSIS — R112 Nausea with vomiting, unspecified: Secondary | ICD-10-CM | POA: Diagnosis not present

## 2016-08-26 DIAGNOSIS — R69 Illness, unspecified: Secondary | ICD-10-CM

## 2016-08-26 LAB — URINALYSIS COMPLETE WITH MICROSCOPIC (ARMC ONLY)
BILIRUBIN URINE: NEGATIVE
Bacteria, UA: NONE SEEN
Glucose, UA: NEGATIVE mg/dL
KETONES UR: NEGATIVE mg/dL
LEUKOCYTES UA: NEGATIVE
NITRITE: NEGATIVE
PH: 6 (ref 5.0–8.0)
Protein, ur: NEGATIVE mg/dL
SPECIFIC GRAVITY, URINE: 1.006 (ref 1.005–1.030)

## 2016-08-26 LAB — CBC
HEMATOCRIT: 41.6 % (ref 35.0–47.0)
Hemoglobin: 14.1 g/dL (ref 12.0–16.0)
MCH: 26.3 pg (ref 26.0–34.0)
MCHC: 34 g/dL (ref 32.0–36.0)
MCV: 77.4 fL — AB (ref 80.0–100.0)
PLATELETS: 186 10*3/uL (ref 150–440)
RBC: 5.37 MIL/uL — AB (ref 3.80–5.20)
RDW: 13.7 % (ref 11.5–14.5)
WBC: 7.7 10*3/uL (ref 3.6–11.0)

## 2016-08-26 LAB — COMPREHENSIVE METABOLIC PANEL
ALT: 11 U/L — ABNORMAL LOW (ref 14–54)
AST: 21 U/L (ref 15–41)
Albumin: 4.3 g/dL (ref 3.5–5.0)
Alkaline Phosphatase: 73 U/L (ref 38–126)
Anion gap: 6 (ref 5–15)
BUN: 10 mg/dL (ref 6–20)
CHLORIDE: 107 mmol/L (ref 101–111)
CO2: 23 mmol/L (ref 22–32)
CREATININE: 0.68 mg/dL (ref 0.44–1.00)
Calcium: 9.1 mg/dL (ref 8.9–10.3)
Glucose, Bld: 120 mg/dL — ABNORMAL HIGH (ref 65–99)
POTASSIUM: 3.5 mmol/L (ref 3.5–5.1)
Sodium: 136 mmol/L (ref 135–145)
Total Bilirubin: 0.7 mg/dL (ref 0.3–1.2)
Total Protein: 7.7 g/dL (ref 6.5–8.1)

## 2016-08-26 LAB — POC URINE PREG, ED: Preg Test, Ur: NEGATIVE

## 2016-08-26 LAB — LIPASE, BLOOD: LIPASE: 22 U/L (ref 11–51)

## 2016-08-26 MED ORDER — ONDANSETRON 4 MG PO TBDP: 4.0000 mg | ORAL_TABLET | Freq: Three times a day (TID) | ORAL | 0 refills | Status: DC | PRN

## 2016-08-26 MED ORDER — NAPROXEN 500 MG PO TABS
500.0000 mg | ORAL_TABLET | Freq: Once | ORAL | Status: AC
  Administered 2016-08-26: 500 mg via ORAL
  Filled 2016-08-26: qty 1

## 2016-08-26 MED ORDER — NAPROXEN 500 MG PO TABS: 500.0000 mg | ORAL_TABLET | Freq: Two times a day (BID) | ORAL | 0 refills | Status: DC

## 2016-08-26 MED ORDER — ONDANSETRON 4 MG PO TBDP
8.0000 mg | ORAL_TABLET | Freq: Once | ORAL | Status: AC
  Administered 2016-08-26: 8 mg via ORAL
  Filled 2016-08-26: qty 2

## 2016-08-26 NOTE — ED Provider Notes (Signed)
[facility] Emergency Department Provider Note  ____________________________________________  Time seen: Approximately 7:46 AM  I have reviewed the triage vital signs and the nursing notes.   HISTORY  Chief Complaint Back Pain; Emesis; and Diarrhea    HPI Lisa Grimes is a 20 y.o. female who complains of nausea vomiting and diarrhea as well as diffuse back pain. Symptoms have and worse over the last 2 or 3 days. She reports that for the last 2 weeks nearly she has been battling a "kidney infection". She has seen her primary care doctor and been to the emergency room for the same symptoms. She started Cipro about 5 days ago. Review of the electronic medical records shows that 3 days ago she had a urinalysis and urine culture performed which were both unremarkable. She is not currently taking anything for the pain. She does also report some sore throat and nasal congestion.   LMP was 4 weeks ago. No current vaginal bleeding  Past Medical History:  Diagnosis Date  . Abnormal white blood cell count   . Acute bronchitis   . Anxiety   . Depression with anxiety   . Dye allergic reaction   . Epistaxis   . Fatigue   . Gastroesophageal reflux disease   . Hypoglycemia   . Influenza A   . Insomnia   . Major depressive disorder, recurrent episode, moderate with anxious distress (HCC)   . Menstrual headache   . Migraine headache   . Mild major depression (HCC)   . Palpitations   . Panic disorder with agoraphobia and severe panic attacks   . Parent-child relational problem   . Rhinitis, allergic   . Screening for depression   . UTI (lower urinary tract infection)   . Viral syndrome      Patient Active Problem List   Diagnosis Date Noted  . Abnormal WBC count 02/20/2015  . Allergic reaction to dye 02/20/2015  . Allergic rhinitis 02/20/2015  . Routine general medical examination at a health care facility 02/20/2015  . Anxiety 02/20/2015  . Family  planning 02/20/2015  . Acute bronchitis 02/20/2015  . Anxiety and depression 02/20/2015  . Bleeding from the nose 02/20/2015  . Fatigue 02/20/2015  . Fever of undetermined origin 02/20/2015  . Acid reflux 02/20/2015  . Headache, menstrual migraine 02/20/2015  . Cardiac murmur 02/20/2015  . H/O cardiovascular disorder 02/20/2015  . Cannot sleep 02/20/2015  . Depression, major, recurrent, moderate (HCC) 02/20/2015  . Hypoglycemia 02/20/2015  . Adaptive colitis 02/20/2015  . Influenza due to influenza A virus 02/20/2015  . Received influenza vaccination at hospital 02/20/2015  . Need for vaccination for meningococcus 02/20/2015  . Headache, migraine 02/20/2015  . Mild major depression (HCC) 02/20/2015  . Need for vaccination 02/20/2015  . Flu vaccine need 02/20/2015  . Intermittent tremor 02/20/2015  . Awareness of heartbeats 02/20/2015  . Disease caused by virus 02/20/2015  . Lower urinary tract infection 02/20/2015  . Screening for depression 02/20/2015  . Screening examination for pulmonary tuberculosis 02/20/2015  . Counseling for guardian-child conflict 02/20/2015  . Panic disorder with agoraphobia and severe panic attacks 02/20/2015     Past Surgical History:  Procedure Laterality Date  . EYE SURGERY    . MOUTH SURGERY  2010  . STRABISMUS SURGERY  1997  . TONSILLECTOMY  2003  . tubes in ear  2000     Prior to Admission medications   Medication Sig Start Date End Date Taking? Authorizing Provider  ALPRAZolam (  NIRAVAM) 0.5 MG dissolvable tablet Take 1 tablet (0.5 mg total) by mouth every 8 (eight) hours as needed for anxiety. 01/29/16   Richard Hulen Shouts., MD  DULoxetine (CYMBALTA) 20 MG capsule Take 1 capsule (20 mg total) by mouth daily. Patient not taking: Reported on 08/23/2016 01/29/16   Maple Hudson., MD  ibuprofen (ADVIL,MOTRIN) 800 MG tablet Take 1 tablet (800 mg total) by mouth every 8 (eight) hours as needed. Patient not taking: Reported on  08/23/2016 03/06/16   Charmayne Sheer Beers, PA-C  naproxen (NAPROSYN) 500 MG tablet Take 1 tablet (500 mg total) by mouth 2 (two) times daily with a meal. 08/26/16   Sharman Cheek, MD  ondansetron (ZOFRAN ODT) 4 MG disintegrating tablet Take 1 tablet (4 mg total) by mouth every 8 (eight) hours as needed for nausea or vomiting. 08/26/16   Sharman Cheek, MD  ondansetron (ZOFRAN) 4 MG tablet Take 1 tablet (4 mg total) by mouth every 6 (six) hours. 08/19/16   Loren Racer, MD  pantoprazole (PROTONIX) 40 MG tablet Take 1 tablet (40 mg total) by mouth daily. 09/26/15   Seeplaputhur Wynona Luna, MD     Allergies Penicillins and Red dye   Family History  Problem Relation Age of Onset  . Depression Mother   . Anxiety disorder Mother   . Hypertension Mother   . Alcohol abuse Father   . Hypertension Father   . Bipolar disorder Maternal Aunt   . Bipolar disorder Maternal Uncle   . Bipolar disorder Paternal Uncle   . Depression Maternal Grandmother   . Stroke Maternal Grandmother   . Hypertension Maternal Grandmother   . Depression Paternal Grandmother   . Bipolar disorder Paternal Grandmother   . ADD / ADHD Cousin   . Depression Cousin   . Heart attack Maternal Aunt   . Heart attack Maternal Uncle   . Stroke Maternal Grandfather   . Hypertension Maternal Grandfather     Social History Social History  Substance Use Topics  . Smoking status: Never Smoker  . Smokeless tobacco: Never Used  . Alcohol use No    Review of Systems  Constitutional:   No fever or chills.  ENT:   Positive sore throat and nasal congestion. Cardiovascular:   No chest pain. Respiratory:   No dyspnea or cough. Gastrointestinal:   Positive lower abdominal pain. Positive vomiting and diarrhea.  Genitourinary:   Dysuria recently that resolved. No difficulty urinating.. Musculoskeletal:   Diffuse back pain right greater than left Neurological:   Negative for headaches 10-point ROS otherwise  negative.  ____________________________________________   PHYSICAL EXAM:  VITAL SIGNS: ED Triage Vitals [08/26/16 0616]  Enc Vitals Group     BP 133/73     Pulse Rate 84     Resp 20     Temp 98.2 F (36.8 C)     Temp Source Oral     SpO2 99 %     Weight      Height      Head Circumference      Peak Flow      Pain Score 8     Pain Loc      Pain Edu?      Excl. in GC?     Vital signs reviewed, nursing assessments reviewed.   Constitutional:   Alert and oriented. Well appearing and in no distress. Eyes:   No scleral icterus. No conjunctival pallor. PERRL. EOMI.  No nystagmus. ENT   Head:   Normocephalic  and atraumatic.   Nose:   No congestion/rhinnorhea. No septal hematoma   Mouth/Throat:   MMM, no pharyngeal erythema. No peritonsillar mass.    Neck:   No stridor. No SubQ emphysema. No meningismus. Hematological/Lymphatic/Immunilogical:   No cervical lymphadenopathy. Cardiovascular:   RRR. Symmetric bilateral radial and DP pulses.  No murmurs.  Respiratory:   Normal respiratory effort without tachypnea nor retractions. Breath sounds are clear and equal bilaterally. No wheezes/rales/rhonchi. Gastrointestinal:   Soft with slight suprapubic tenderness. Non distended. There is no CVA tenderness.  No rebound, rigidity, or guarding. Genitourinary:   deferred Musculoskeletal:   Nontender with normal range of motion in all extremities. No joint effusions.  No lower extremity tenderness.  No edema. Neurologic:   Normal speech and language.  CN 2-10 normal. Motor grossly intact. No gross focal neurologic deficits are appreciated.  Skin:    Skin is warm, dry and intact. No rash noted.  No petechiae, purpura, or bullae.  ____________________________________________    LABS (pertinent positives/negatives) (all labs ordered are listed, but only abnormal results are displayed) Labs Reviewed  COMPREHENSIVE METABOLIC PANEL - Abnormal; Notable for the following:        Result Value   Glucose, Bld 120 (*)    ALT 11 (*)    All other components within normal limits  CBC - Abnormal; Notable for the following:    RBC 5.37 (*)    MCV 77.4 (*)    All other components within normal limits  URINALYSIS COMPLETEWITH MICROSCOPIC ([facility] ONLY) - Abnormal; Notable for the following:    Color, Urine STRAW (*)    APPearance CLEAR (*)    Hgb urine dipstick 1+ (*)    Squamous Epithelial / LPF 0-5 (*)    All other components within normal limits  LIPASE, BLOOD  POC URINE PREG, ED   ____________________________________________   EKG    ____________________________________________    RADIOLOGY    ____________________________________________   PROCEDURES Procedures  ____________________________________________   INITIAL IMPRESSION / ASSESSMENT AND PLAN / ED COURSE  Pertinent labs & imaging results that were available during my care of the patient were reviewed by me and considered in my medical decision making (see chart for details).  Patient presents with persistent symptoms after recent evaluation by her PCP and emergency department. Review of electronic medical records shows that most recently her urine culture was clean. She had a renal ultrasound performed which was unremarkable. The rest of her labs and workup then and now are unremarkable. She has normal vital signs today. Her exam is reassuring and benign. Her urinary symptoms have improved. I think the suprapubic tenderness is likely due to the imminent onset of her menstrual cycle. Also with her exposure to health care settings recently I believe she has a influenza-like illness at present time which will be self-limited. I'll offer the patient NSAIDs and Zofran for symptom control along with reassurance.Considering the patient's symptoms, medical history, and physical examination today, I have low suspicion for cholecystitis or biliary pathology, pancreatitis, perforation or bowel obstruction,  hernia, intra-abdominal abscess, AAA or dissection, volvulus or intussusception, mesenteric ischemia, or appendicitis.  Low suspicion for STI PID TOA or torsion.     Clinical Course   ____________________________________________   FINAL CLINICAL IMPRESSION(S) / ED DIAGNOSES  Final diagnoses:  Nausea vomiting and diarrhea  Influenza-like illness       Portions of this note were generated with dragon dictation software. Dictation errors may occur despite best attempts at proofreading.  Sharman Cheek, MD 08/26/16 [phone]

## 2016-08-26 NOTE — ED Triage Notes (Signed)
Patient reports recently diagnosed with a kidney infection.  Reports over past couple days having back pain with nausea, vomiting and diarrhea.

## 2016-11-11 NOTE — L&D Delivery Note (Signed)
Delivery Note At 9:19 AM a viable female was delivered via  (Presentation:LOA ;  ).  APGAR: 6,6,7 , ; weight pending  .   Placenta status: intact , .  Cord: 3 vessel with the following complications: none  Anesthesia:  epidural Episiotomy:  n/a Lacerations:  Minor tears  Est. Blood Loss (mL):  150  Mom to postpartum.  Baby to Couplet care / Skin to Skin.  Lisa Grimes 06/15/2017, 9:37 AM

## 2016-11-16 ENCOUNTER — Encounter (HOSPITAL_COMMUNITY): Payer: Self-pay

## 2016-11-16 ENCOUNTER — Inpatient Hospital Stay (HOSPITAL_COMMUNITY)
Admission: AD | Admit: 2016-11-16 | Discharge: 2016-11-16 | Disposition: A | Discharge status: Home / Self Care | Payer: BLUE CROSS/BLUE SHIELD | Source: Ambulatory Visit | Attending: Obstetrics and Gynecology | Admitting: Obstetrics and Gynecology

## 2016-11-16 ENCOUNTER — Inpatient Hospital Stay (HOSPITAL_COMMUNITY): Payer: BLUE CROSS/BLUE SHIELD

## 2016-11-16 DIAGNOSIS — F331 Major depressive disorder, recurrent, moderate: Secondary | ICD-10-CM | POA: Insufficient documentation

## 2016-11-16 DIAGNOSIS — O4690 Antepartum hemorrhage, unspecified, unspecified trimester: Secondary | ICD-10-CM | POA: Diagnosis not present

## 2016-11-16 DIAGNOSIS — O418X10 Other specified disorders of amniotic fluid and membranes, first trimester, not applicable or unspecified: Secondary | ICD-10-CM

## 2016-11-16 DIAGNOSIS — O26891 Other specified pregnancy related conditions, first trimester: Secondary | ICD-10-CM | POA: Insufficient documentation

## 2016-11-16 DIAGNOSIS — Z3490 Encounter for supervision of normal pregnancy, unspecified, unspecified trimester: Secondary | ICD-10-CM | POA: Diagnosis not present

## 2016-11-16 DIAGNOSIS — O468X1 Other antepartum hemorrhage, first trimester: Secondary | ICD-10-CM | POA: Diagnosis not present

## 2016-11-16 DIAGNOSIS — O99611 Diseases of the digestive system complicating pregnancy, first trimester: Secondary | ICD-10-CM | POA: Insufficient documentation

## 2016-11-16 DIAGNOSIS — F418 Other specified anxiety disorders: Secondary | ICD-10-CM | POA: Diagnosis not present

## 2016-11-16 DIAGNOSIS — O99341 Other mental disorders complicating pregnancy, first trimester: Secondary | ICD-10-CM | POA: Insufficient documentation

## 2016-11-16 DIAGNOSIS — K219 Gastro-esophageal reflux disease without esophagitis: Secondary | ICD-10-CM | POA: Diagnosis not present

## 2016-11-16 DIAGNOSIS — O209 Hemorrhage in early pregnancy, unspecified: Secondary | ICD-10-CM | POA: Insufficient documentation

## 2016-11-16 DIAGNOSIS — R109 Unspecified abdominal pain: Secondary | ICD-10-CM | POA: Insufficient documentation

## 2016-11-16 DIAGNOSIS — Z3A01 Less than 8 weeks gestation of pregnancy: Secondary | ICD-10-CM | POA: Insufficient documentation

## 2016-11-16 DIAGNOSIS — O3680X0 Pregnancy with inconclusive fetal viability, not applicable or unspecified: Secondary | ICD-10-CM

## 2016-11-16 DIAGNOSIS — G4700 Insomnia, unspecified: Secondary | ICD-10-CM | POA: Insufficient documentation

## 2016-11-16 DIAGNOSIS — Z6790 Unspecified blood type, Rh positive: Secondary | ICD-10-CM

## 2016-11-16 LAB — WET PREP, GENITAL
Clue Cells Wet Prep HPF POC: NONE SEEN
Sperm: NONE SEEN
Trich, Wet Prep: NONE SEEN
Yeast Wet Prep HPF POC: NONE SEEN

## 2016-11-16 LAB — URINALYSIS, ROUTINE W REFLEX MICROSCOPIC
Bacteria, UA: NONE SEEN
Glucose, UA: NEGATIVE mg/dL
Hgb urine dipstick: NEGATIVE
Ketones, ur: 80 mg/dL — AB
Nitrite: NEGATIVE
Protein, ur: 30 mg/dL — AB
Specific Gravity, Urine: 1.029 (ref 1.005–1.030)
pH: 5 (ref 5.0–8.0)

## 2016-11-16 LAB — POCT PREGNANCY, URINE: Preg Test, Ur: POSITIVE — AB

## 2016-11-16 LAB — CBC
HCT: 39.3 % (ref 36.0–46.0)
Hemoglobin: 13.1 g/dL (ref 12.0–15.0)
MCH: 26.1 pg (ref 26.0–34.0)
MCHC: 33.3 g/dL (ref 30.0–36.0)
MCV: 78.4 fL (ref 78.0–100.0)
Platelets: 194 K/uL (ref 150–400)
RBC: 5.01 MIL/uL (ref 3.87–5.11)
RDW: 13.7 % (ref 11.5–15.5)
WBC: 6.9 K/uL (ref 4.0–10.5)

## 2016-11-16 LAB — ABO/RH: ABO/RH(D): A POS

## 2016-11-16 LAB — HCG, QUANTITATIVE, PREGNANCY: hCG, Beta Chain, Quant, S: 1179 m[IU]/mL — ABNORMAL HIGH (ref <5)

## 2016-11-16 NOTE — MAU Provider Note (Signed)
History     CSN: [id number]  Arrival date and time: 11/16/16 5784   First Provider Initiated Contact with Patient 11/16/16 [phone]      Chief Complaint  Patient presents with  . Vaginal Bleeding  . Abdominal Pain   G1 @[redacted]w[redacted]d  by LMP here with spotting. She saw bright red blood on the toilet paper around 0600. She did not see blood in the toilet. No recent IC. She is currently being treated for UTI and taking Macrobid x2 days. She reports lower abdominal cramping x4-5 days. She hasn't used anything for the pain.     OB History    Gravida Para Term Preterm AB Living   1             SAB TAB Ectopic Multiple Live Births                  Past Medical History:  Diagnosis Date  . Abnormal white blood cell count   . Acute bronchitis   . Anxiety   . Depression with anxiety   . Dye allergic reaction   . Epistaxis   . Fatigue   . Gastroesophageal reflux disease   . Hypoglycemia   . Influenza A   . Insomnia   . Major depressive disorder, recurrent episode, moderate with anxious distress (HCC)   . Menstrual headache   . Migraine headache   . Mild major depression (HCC)   . Palpitations   . Panic disorder with agoraphobia and severe panic attacks   . Parent-child relational problem   . Rhinitis, allergic   . Screening for depression   . UTI (lower urinary tract infection)   . Viral syndrome     Past Surgical History:  Procedure Laterality Date  . EYE SURGERY    . MOUTH SURGERY  2010  . STRABISMUS SURGERY  1997  . TONSILLECTOMY  2003  . tubes in ear  2000    Family History  Problem Relation Age of Onset  . Depression Mother   . Anxiety disorder Mother   . Hypertension Mother   . Alcohol abuse Father   . Hypertension Father   . Bipolar disorder Maternal Aunt   . Bipolar disorder Maternal Uncle   . Bipolar disorder Paternal Uncle   . Depression Maternal Grandmother   . Stroke Maternal Grandmother   . Hypertension Maternal Grandmother   . Depression Paternal  Grandmother   . Bipolar disorder Paternal Grandmother   . ADD / ADHD Cousin   . Depression Cousin   . Heart attack Maternal Aunt   . Heart attack Maternal Uncle   . Stroke Maternal Grandfather   . Hypertension Maternal Grandfather     Social History  Substance Use Topics  . Smoking status: Never Smoker  . Smokeless tobacco: Never Used  . Alcohol use No    Allergies:  Allergies  Allergen Reactions  . Penicillins Hives  . Red Dye     possible allergy    Prescriptions Prior to Admission  Medication Sig Dispense Refill Last Dose  . ALPRAZolam (NIRAVAM) 0.5 MG dissolvable tablet Take 1 tablet (0.5 mg total) by mouth every 8 (eight) hours as needed for anxiety. 90 tablet 1 Taking  . DULoxetine (CYMBALTA) 20 MG capsule Take 1 capsule (20 mg total) by mouth daily. (Patient not taking: Reported on 08/23/2016) 30 capsule 5 Not Taking  . ibuprofen (ADVIL,MOTRIN) 800 MG tablet Take 1 tablet (800 mg total) by mouth every 8 (eight) hours as needed. (Patient not  taking: Reported on 08/23/2016) 30 tablet 0 Not Taking  . naproxen (NAPROSYN) 500 MG tablet Take 1 tablet (500 mg total) by mouth 2 (two) times daily with a meal. 20 tablet 0   . ondansetron (ZOFRAN ODT) 4 MG disintegrating tablet Take 1 tablet (4 mg total) by mouth every 8 (eight) hours as needed for nausea or vomiting. 20 tablet 0   . ondansetron (ZOFRAN) 4 MG tablet Take 1 tablet (4 mg total) by mouth every 6 (six) hours. 12 tablet 0 Taking  . pantoprazole (PROTONIX) 40 MG tablet Take 1 tablet (40 mg total) by mouth daily. 30 tablet 0 Taking    Review of Systems  Constitutional: Negative for fever.  Gastrointestinal: Positive for abdominal pain and constipation.  Genitourinary: Positive for dysuria, vaginal bleeding and vaginal discharge (white, watery). Negative for frequency, hematuria and urgency.  Musculoskeletal: Positive for back pain (mild, intermittent).   Physical Exam   Blood pressure 134/69, pulse 100, temperature  98.1 F (36.7 C), temperature source Oral, resp. rate 18, last menstrual period 10/10/2016.  Physical Exam  Nursing note and vitals reviewed. Constitutional: She is oriented to person, place, and time. She appears well-developed and well-nourished. No distress (appears comfortable).  HENT:  Head: Normocephalic and atraumatic.  Neck: Normal range of motion.  Cardiovascular: Normal rate.   Respiratory: Effort normal.  GI: Soft. She exhibits no distension and no mass. There is tenderness in the suprapubic area. There is no rebound and no guarding.  Genitourinary:  Genitourinary Comments: External: no lesions or erythema Vagina: rugated, nulli, scant thin yellow discharge Uterus: non enlarged, anteverted, non tender, no CMT Adnexae: no masses, no tenderness left, no tenderness right   Musculoskeletal: Normal range of motion.  Neurological: She is alert and oriented to person, place, and time.  Skin: Skin is warm and dry.  Psychiatric: She has a normal mood and affect.   Results for orders placed or performed during the hospital encounter of 11/16/16 (from the past 24 hour(s))  Urinalysis, Routine w reflex microscopic     Status: Abnormal   Collection Time: 11/16/16  8:18 AM  Result Value Ref Range   Color, Urine YELLOW YELLOW   APPearance HAZY (A) CLEAR   Specific Gravity, Urine 1.029 1.005 - 1.030   pH 5.0 5.0 - 8.0   Glucose, UA NEGATIVE NEGATIVE mg/dL   Hgb urine dipstick NEGATIVE NEGATIVE   Bilirubin Urine SMALL (A) NEGATIVE   Ketones, ur 80 (A) NEGATIVE mg/dL   Protein, ur 30 (A) NEGATIVE mg/dL   Nitrite NEGATIVE NEGATIVE   Leukocytes, UA TRACE (A) NEGATIVE   RBC / HPF 0-5 0 - 5 RBC/hpf   WBC, UA 0-5 0 - 5 WBC/hpf   Bacteria, UA NONE SEEN NONE SEEN   Squamous Epithelial / LPF 6-30 (A) NONE SEEN   Mucous PRESENT   Pregnancy, urine POC     Status: Abnormal   Collection Time: 11/16/16  8:31 AM  Result Value Ref Range   Preg Test, Ur POSITIVE (A) NEGATIVE  Wet prep,  genital     Status: Abnormal   Collection Time: 11/16/16  8:55 AM  Result Value Ref Range   Yeast Wet Prep HPF POC NONE SEEN NONE SEEN   Trich, Wet Prep NONE SEEN NONE SEEN   Clue Cells Wet Prep HPF POC NONE SEEN NONE SEEN   WBC, Wet Prep HPF POC FEW (A) NONE SEEN   Sperm NONE SEEN   CBC     Status: None  Collection Time: 11/16/16  8:57 AM  Result Value Ref Range   WBC 6.9 4.0 - 10.5 K/uL   RBC 5.01 3.87 - 5.11 MIL/uL   Hemoglobin 13.1 12.0 - 15.0 g/dL   HCT 16.139.3 09.636.0 - 04.546.0 %   MCV 78.4 78.0 - 100.0 fL   MCH 26.1 26.0 - 34.0 pg   MCHC 33.3 30.0 - 36.0 g/dL   RDW 40.913.7 81.111.5 - 91.415.5 %   Platelets 194 150 - 400 K/uL  hCG, quantitative, pregnancy     Status: Abnormal   Collection Time: 11/16/16  8:57 AM  Result Value Ref Range   hCG, Beta Chain, Quant, S 1,179 (H) <5 mIU/mL  ABO/Rh     Status: None   Collection Time: 11/16/16  8:57 AM  Result Value Ref Range   ABO/RH(D) A POS    [place] Ob Comp Less 14 Wks  Result Date: 11/16/2016 CLINICAL DATA:  Pregnant patient with vaginal bleeding. Patient is 5 days in [redacted] weeks pregnant based on her last menstrual period. Quantitative beta HCG level is 1,179. EXAM: OBSTETRIC <14 WK US AND TRANSVAGINAL OB US TECHNIQUE: Both transabdominal and transvaginal ultrasound examinations were performed for complete evaluation of the gestation as well as the maternal uterus, adnexal regions, and pelvic cul-de-sac. Transvaginal technique was performed to assess early pregnancy. COMPARISON:  None. FINDINGS: Intrauterine gestational sac: Probable small single intrauterine gestational sac. Yolk sac:  No Embryo:  No MSD: 2.4  mm   4 w   6  d Subchorionic hemorrhage:  Small subchronic hemorrhage. Maternal uterus/adnexae: Cystic structure arises from or lies directly adjacent to the uterine fundus measuring 12 x 14 x 12 mm. This may reflect an inclusion cyst abutting the uterine fundus. Uterus otherwise unremarkable. Normal ovaries. No pelvic free fluid. IMPRESSION: 1. Small  apparent gestational sac in the uterus consistent with an early pregnancy, 4 weeks and 6 days based on the mean sac diameter. Probable early intrauterine gestational sac, but no yolk sac, fetal pole, or cardiac activity yet visualized. Recommend follow-up quantitative B-HCG levels and follow-up US in 14 days to assess viability. This recommendation follows SRU consensus guidelines: Diagnostic Criteria for Nonviable Pregnancy Early in the First Trimester. Malva Limes Engl J Med 2013; 782:9562-13; 369:1443-51. 2. Small subchronic hemorrhage. 3. No other abnormalities.  Ovaries and adnexa are unremarkable. Electronically Signed   By: Amie Portlandavid  Ormond M.D.   On: 11/16/2016 11:31   [place] Ob Transvaginal  Result Date: 11/16/2016 CLINICAL DATA:  Pregnant patient with vaginal bleeding. Patient is 5 days in [redacted] weeks pregnant based on her last menstrual period. Quantitative beta HCG level is 1,179. EXAM: OBSTETRIC <14 WK US AND TRANSVAGINAL OB US TECHNIQUE: Both transabdominal and transvaginal ultrasound examinations were performed for complete evaluation of the gestation as well as the maternal uterus, adnexal regions, and pelvic cul-de-sac. Transvaginal technique was performed to assess early pregnancy. COMPARISON:  None. FINDINGS: Intrauterine gestational sac: Probable small single intrauterine gestational sac. Yolk sac:  No Embryo:  No MSD: 2.4  mm   4 w   6  d Subchorionic hemorrhage:  Small subchronic hemorrhage. Maternal uterus/adnexae: Cystic structure arises from or lies directly adjacent to the uterine fundus measuring 12 x 14 x 12 mm. This may reflect an inclusion cyst abutting the uterine fundus. Uterus otherwise unremarkable. Normal ovaries. No pelvic free fluid. IMPRESSION: 1. Small apparent gestational sac in the uterus consistent with an early pregnancy, 4 weeks and 6 days based on the mean sac diameter. Probable early intrauterine gestational  sac, but no yolk sac, fetal pole, or cardiac activity yet visualized. Recommend follow-up  quantitative B-HCG levels and follow-up US in 14 days to assess viability. This recommendation follows SRU consensus guidelines: Diagnostic Criteria for Nonviable Pregnancy Early in the First Trimester. Malva Limes Med 2013; 161:0960-45. 2. Small subchronic hemorrhage. 3. No other abnormalities.  Ovaries and adnexa are unremarkable. Electronically Signed   By: Amie Portland M.D.   On: 11/16/2016 11:31    MAU Course  Procedures  MDM Labs ordered and reviewed. Cannot r/o early pregnancy, ectopic pregnancy, or failed pregnancy. Presentation, clinical findings, and plan discussed with Dr. Vincente Poli. Recommend f/u quant in office in 2 days. Stable for discharge home.    Assessment and Plan   1. Pregnancy of unknown anatomic location   2. Vaginal bleeding in pregnancy   3. Rh(D) positive   4.      Small [facility]  Discharge home SAB/ectopic precautions Follow up at [facility] in 2 days  Allergies as of 11/16/2016      Reactions   Amoxicillin Hives   Has patient had a PCN reaction causing immediate rash, facial/tongue/throat swelling, SOB or lightheadedness with hypotension: Yes Has patient had a PCN reaction causing severe rash involving mucus membranes or skin necrosis: No Has patient had a PCN reaction that required hospitalization No Has patient had a PCN reaction occurring within the last 10 years: No If all of the above answers are "NO", then may proceed with Cephalosporin use.   Amoxicillin Hives   Has patient had a PCN reaction causing immediate rash, facial/tongue/throat swelling, SOB or lightheadedness with hypotension: Yes Has patient had a PCN reaction causing severe rash involving mucus membranes or skin necrosis: No Has patient had a PCN reaction that required hospitalization No Has patient had a PCN reaction occurring within the last 10 years: No If all of the above answers are "NO", then may proceed with Cephalosporin use.   Penicillins Hives   Has patient had a PCN reaction  causing immediate rash, facial/tongue/throat swelling, SOB or lightheadedness with hypotension: Yes Has patient had a PCN reaction causing severe rash involving mucus membranes or skin necrosis: No Has patient had a PCN reaction that required hospitalization No Has patient had a PCN reaction occurring within the last 10 years: no If all of the above answers are "NO", then may proceed with Cephalosporin use.   Red Dye    possible allergy      Medication List    STOP taking these medications   ALPRAZolam 0.5 MG dissolvable tablet Commonly known as:  NIRAVAM   DULoxetine 20 MG capsule Commonly known as:  CYMBALTA   ibuprofen 800 MG tablet Commonly known as:  ADVIL,MOTRIN   naproxen 500 MG tablet Commonly known as:  NAPROSYN   ondansetron 4 MG disintegrating tablet Commonly known as:  ZOFRAN ODT   ondansetron 4 MG tablet Commonly known as:  ZOFRAN   pantoprazole 40 MG tablet Commonly known as:  PROTONIX     TAKE these medications   multivitamin-prenatal 27-0.8 MG Tabs tablet Take 1 tablet by mouth daily at 12 noon.   nitrofurantoin (macrocrystal-monohydrate) 100 MG capsule Commonly known as:  MACROBID Take 1 capsule by mouth twice a day      Donette Larry, CNM 11/16/2016, 8:45 AM

## 2016-11-16 NOTE — MAU Note (Addendum)
Patient presents with spotting after wiping this morning bright red in nature, lower abdominal pain x 2 to 3 days. Patient states that she recently had a kidney infection and is prone to having them, has not been evaluated by an urologist.

## 2016-11-16 NOTE — Discharge Instructions (Signed)
Vaginal Bleeding During Pregnancy, First Trimester °A small amount of bleeding (spotting) from the vagina is common in early pregnancy. Sometimes the bleeding is normal and is not a problem, and sometimes it is a sign of something serious. Be sure to tell your doctor about any bleeding from your vagina right away. °Follow these instructions at home: °· Watch your condition for any changes. °· Follow your doctor's instructions about how active you can be. °· If you are on bed rest: °¨ You may need to stay in bed and only get up to use the bathroom. °¨ You may be allowed to do some activities. °¨ If you need help, make plans for someone to help you. °· Write down: °¨ The number of pads you use each day. °¨ How often you change pads. °¨ How soaked (saturated) your pads are. °· Do not use tampons. °· Do not douche. °· Do not have sex or orgasms until your doctor says it is okay. °· If you pass any tissue from your vagina, save the tissue so you can show it to your doctor. °· Only take medicines as told by your doctor. °· Do not take aspirin because it can make you bleed. °· Keep all follow-up visits as told by your doctor. °Contact a doctor if: °· You bleed from your vagina. °· You have cramps. °· You have labor pains. °· You have a fever that does not go away after you take medicine. °Get help right away if: °· You have very bad cramps in your back or belly (abdomen). °· You pass large clots or tissue from your vagina. °· You bleed more. °· You feel light-headed or weak. °· You pass out (faint). °· You have chills. °· You are leaking fluid or have a gush of fluid from your vagina. °· You pass out while pooping (having a bowel movement). °This information is not intended to replace advice given to you by your health care provider. Make sure you discuss any questions you have with your health care provider. °Document Released: 03/14/2014 Document Revised: 04/04/2016 Document Reviewed: 07/05/2013 °Elsevier Interactive  Patient Education © 2017 Elsevier Inc. ° °

## 2016-11-18 LAB — GC/CHLAMYDIA PROBE AMP (~~LOC~~) NOT AT ARMC
Chlamydia: NEGATIVE
Neisseria Gonorrhea: NEGATIVE

## 2016-12-04 LAB — OB RESULTS CONSOLE ANTIBODY SCREEN: Antibody Screen: NEGATIVE

## 2016-12-04 LAB — OB RESULTS CONSOLE RUBELLA ANTIBODY, IGM: RUBELLA: IMMUNE

## 2016-12-04 LAB — OB RESULTS CONSOLE HEPATITIS B SURFACE ANTIGEN: Hepatitis B Surface Ag: NEGATIVE

## 2016-12-04 LAB — OB RESULTS CONSOLE RPR: RPR: NONREACTIVE

## 2016-12-04 LAB — OB RESULTS CONSOLE GC/CHLAMYDIA: GC PROBE AMP, GENITAL: NEGATIVE

## 2016-12-04 LAB — OB RESULTS CONSOLE HIV ANTIBODY (ROUTINE TESTING): HIV: NONREACTIVE

## 2016-12-05 ENCOUNTER — Inpatient Hospital Stay (HOSPITAL_COMMUNITY)
Admission: AD | Admit: 2016-12-05 | Discharge: 2016-12-05 | Disposition: A | Discharge status: Home / Self Care | Payer: BLUE CROSS/BLUE SHIELD | Source: Ambulatory Visit | Attending: Obstetrics and Gynecology | Admitting: Obstetrics and Gynecology

## 2016-12-05 ENCOUNTER — Encounter (HOSPITAL_COMMUNITY): Payer: Self-pay

## 2016-12-05 DIAGNOSIS — F418 Other specified anxiety disorders: Secondary | ICD-10-CM | POA: Insufficient documentation

## 2016-12-05 DIAGNOSIS — G43909 Migraine, unspecified, not intractable, without status migrainosus: Secondary | ICD-10-CM | POA: Diagnosis not present

## 2016-12-05 DIAGNOSIS — O99611 Diseases of the digestive system complicating pregnancy, first trimester: Secondary | ICD-10-CM | POA: Insufficient documentation

## 2016-12-05 DIAGNOSIS — G4700 Insomnia, unspecified: Secondary | ICD-10-CM | POA: Diagnosis not present

## 2016-12-05 DIAGNOSIS — O99341 Other mental disorders complicating pregnancy, first trimester: Secondary | ICD-10-CM | POA: Insufficient documentation

## 2016-12-05 DIAGNOSIS — Z3A08 8 weeks gestation of pregnancy: Secondary | ICD-10-CM | POA: Insufficient documentation

## 2016-12-05 DIAGNOSIS — K219 Gastro-esophageal reflux disease without esophagitis: Secondary | ICD-10-CM | POA: Diagnosis not present

## 2016-12-05 DIAGNOSIS — N390 Urinary tract infection, site not specified: Secondary | ICD-10-CM | POA: Diagnosis present

## 2016-12-05 DIAGNOSIS — O99351 Diseases of the nervous system complicating pregnancy, first trimester: Secondary | ICD-10-CM | POA: Diagnosis not present

## 2016-12-05 DIAGNOSIS — O2341 Unspecified infection of urinary tract in pregnancy, first trimester: Secondary | ICD-10-CM | POA: Insufficient documentation

## 2016-12-05 DIAGNOSIS — F331 Major depressive disorder, recurrent, moderate: Secondary | ICD-10-CM | POA: Diagnosis not present

## 2016-12-05 DIAGNOSIS — N3001 Acute cystitis with hematuria: Secondary | ICD-10-CM | POA: Diagnosis not present

## 2016-12-05 DIAGNOSIS — M549 Dorsalgia, unspecified: Secondary | ICD-10-CM | POA: Diagnosis not present

## 2016-12-05 DIAGNOSIS — O26891 Other specified pregnancy related conditions, first trimester: Secondary | ICD-10-CM | POA: Insufficient documentation

## 2016-12-05 LAB — CBC WITH DIFFERENTIAL/PLATELET
BASOS ABS: 0 10*3/uL (ref 0.0–0.1)
Basophils Relative: 0 %
EOS PCT: 0 %
Eosinophils Absolute: 0 10*3/uL (ref 0.0–0.7)
HCT: 37.2 % (ref 36.0–46.0)
Hemoglobin: 12.5 g/dL (ref 12.0–15.0)
LYMPHS PCT: 26 %
Lymphs Abs: 1.8 10*3/uL (ref 0.7–4.0)
MCH: 26.9 pg (ref 26.0–34.0)
MCHC: 33.6 g/dL (ref 30.0–36.0)
MCV: 80 fL (ref 78.0–100.0)
Monocytes Absolute: 0.2 10*3/uL (ref 0.1–1.0)
Monocytes Relative: 3 %
NEUTROS ABS: 5 10*3/uL (ref 1.7–7.7)
Neutrophils Relative %: 71 %
PLATELETS: 181 10*3/uL (ref 150–400)
RBC: 4.65 MIL/uL (ref 3.87–5.11)
RDW: 14 % (ref 11.5–15.5)
WBC: 7.1 10*3/uL (ref 4.0–10.5)

## 2016-12-05 LAB — URINALYSIS, ROUTINE W REFLEX MICROSCOPIC
Bilirubin Urine: NEGATIVE
Glucose, UA: NEGATIVE mg/dL
HGB URINE DIPSTICK: NEGATIVE
Ketones, ur: NEGATIVE mg/dL
Nitrite: NEGATIVE
PROTEIN: NEGATIVE mg/dL
Specific Gravity, Urine: 1.005 (ref 1.005–1.030)
pH: 7 (ref 5.0–8.0)

## 2016-12-05 NOTE — MAU Note (Signed)
Urine sent to lab @1235.  

## 2016-12-05 NOTE — MAU Provider Note (Signed)
History     CSN: [id number]  Arrival date and time: 12/05/16 1226   First Provider Initiated Contact with Patient 12/05/16 1309      Chief Complaint  Patient presents with  . Urinary Tract Infection   HPI [name] is a 21 y.o. G1P0 at [redacted]w[redacted]d who presents to MAU today with complaint of worsening UTI symptoms. The patient states that she gets frequent UTIs and was started on Macrobid on Monday. She states that she has noted lower back pain and some lower abdominal cramping. She states only intermittent dysuria and frequency of urination. She has also noted one episode of mild hematuria. She denies vaginal bleeding, urinary urgency, vaginal discharge, fever of flank pain.    OB History    Gravida Para Term Preterm AB Living   1             SAB TAB Ectopic Multiple Live Births                  Past Medical History:  Diagnosis Date  . Abnormal white blood cell count   . Acute bronchitis   . Anxiety   . Depression with anxiety   . Dye allergic reaction   . Epistaxis   . Fatigue   . Gastroesophageal reflux disease   . Hypoglycemia   . Influenza A   . Insomnia   . Major depressive disorder, recurrent episode, moderate with anxious distress (HCC)   . Menstrual headache   . Migraine headache   . Mild major depression (HCC)   . Palpitations   . Panic disorder with agoraphobia and severe panic attacks   . Parent-child relational problem   . Rhinitis, allergic   . Screening for depression   . UTI (lower urinary tract infection)   . Viral syndrome     Past Surgical History:  Procedure Laterality Date  . EYE SURGERY    . MOUTH SURGERY  2010  . STRABISMUS SURGERY  1997  . TONSILLECTOMY  2003  . tubes in ear  2000    Family History  Problem Relation Age of Onset  . Depression Mother   . Anxiety disorder Mother   . Hypertension Mother   . Alcohol abuse Father   . Hypertension Father   . Bipolar disorder Maternal Aunt   . Bipolar disorder Maternal Uncle   .  Bipolar disorder Paternal Uncle   . Depression Maternal Grandmother   . Stroke Maternal Grandmother   . Hypertension Maternal Grandmother   . Depression Paternal Grandmother   . Bipolar disorder Paternal Grandmother   . ADD / ADHD Cousin   . Depression Cousin   . Heart attack Maternal Aunt   . Heart attack Maternal Uncle   . Stroke Maternal Grandfather   . Hypertension Maternal Grandfather     Social History  Substance Use Topics  . Smoking status: Never Smoker  . Smokeless tobacco: Never Used  . Alcohol use No    Allergies:  Allergies  Allergen Reactions  . Amoxicillin Hives    Has patient had a PCN reaction causing immediate rash, facial/tongue/throat swelling, SOB or lightheadedness with hypotension: Yes Has patient had a PCN reaction causing severe rash involving mucus membranes or skin necrosis: No Has patient had a PCN reaction that required hospitalization No Has patient had a PCN reaction occurring within the last 10 years: No If all of the above answers are "NO", then may proceed with Cephalosporin use.   Marland Kitchen Penicillins Hives  Has patient had a PCN reaction causing immediate rash, facial/tongue/throat swelling, SOB or lightheadedness with hypotension: Yes Has patient had a PCN reaction causing severe rash involving mucus membranes or skin necrosis: No Has patient had a PCN reaction that required hospitalization No Has patient had a PCN reaction occurring within the last 10 years: no If all of the above answers are "NO", then may proceed with Cephalosporin use.   . Red Dye     possible allergy    Prescriptions Prior to Admission  Medication Sig Dispense Refill Last Dose  . nitrofurantoin, macrocrystal-monohydrate, (MACROBID) 100 MG capsule Take 1 capsule by mouth twice a day  0 12/05/2016 at Unknown time  . Prenatal Vit-Fe Fumarate-FA (MULTIVITAMIN-PRENATAL) 27-0.8 MG TABS tablet Take 1 tablet by mouth daily at 12 noon.   12/05/2016 at Unknown time    Review of  Systems  Constitutional: Negative for fever.  Gastrointestinal: Positive for abdominal pain and nausea. Negative for constipation, diarrhea and vomiting.  Genitourinary: Positive for dysuria, frequency and hematuria. Negative for flank pain, urgency, vaginal bleeding and vaginal discharge.   Physical Exam   Blood pressure 123/70, temperature 98.2 F (36.8 C), resp. rate 18, height 5\' 3"  (1.6 m), weight 187 lb 1.9 oz (84.9 kg), last menstrual period 10/10/2016.  Physical Exam  Nursing note and vitals reviewed. Constitutional: She is oriented to person, place, and time. She appears well-developed and well-nourished. No distress.  HENT:  Head: Normocephalic and atraumatic.  Cardiovascular: Normal rate.   Respiratory: Effort normal.  GI: Soft. She exhibits no distension and no mass. There is tenderness (mild lower abdominal tenderness to palpation bilaterally). There is no rebound, no guarding and no CVA tenderness.  Musculoskeletal:       Lumbar back: She exhibits tenderness (mild). She exhibits no swelling, no edema, no pain and no spasm.  Neurological: She is alert and oriented to person, place, and time.  Skin: Skin is warm and dry. No erythema.  Psychiatric: She has a normal mood and affect.    Results for orders placed or performed during the hospital encounter of 12/05/16 (from the past 24 hour(s))  Urinalysis, Routine w reflex microscopic     Status: Abnormal   Collection Time: 12/05/16 12:30 PM  Result Value Ref Range   Color, Urine YELLOW YELLOW   APPearance CLEAR CLEAR   Specific Gravity, Urine 1.005 1.005 - 1.030   pH 7.0 5.0 - 8.0   Glucose, UA NEGATIVE NEGATIVE mg/dL   Hgb urine dipstick NEGATIVE NEGATIVE   Bilirubin Urine NEGATIVE NEGATIVE   Ketones, ur NEGATIVE NEGATIVE mg/dL   Protein, ur NEGATIVE NEGATIVE mg/dL   Nitrite NEGATIVE NEGATIVE   Leukocytes, UA TRACE (A) NEGATIVE   RBC / HPF 0-5 0 - 5 RBC/hpf   WBC, UA 0-5 0 - 5 WBC/hpf   Bacteria, UA RARE (A) NONE  SEEN   Squamous Epithelial / LPF 0-5 (A) NONE SEEN  CBC with Differential/Platelet     Status: None   Collection Time: 12/05/16  1:25 PM  Result Value Ref Range   WBC 7.1 4.0 - 10.5 K/uL   RBC 4.65 3.87 - 5.11 MIL/uL   Hemoglobin 12.5 12.0 - 15.0 g/dL   HCT 96.0 45.4 - 09.8 %   MCV 80.0 78.0 - 100.0 fL   MCH 26.9 26.0 - 34.0 pg   MCHC 33.6 30.0 - 36.0 g/dL   RDW 11.9 14.7 - 82.9 %   Platelets 181 150 - 400 K/uL   Neutrophils Relative %  71 %   Neutro Abs 5.0 1.7 - 7.7 K/uL   Lymphocytes Relative 26 %   Lymphs Abs 1.8 0.7 - 4.0 K/uL   Monocytes Relative 3 %   Monocytes Absolute 0.2 0.1 - 1.0 K/uL   Eosinophils Relative 0 %   Eosinophils Absolute 0.0 0.0 - 0.7 K/uL   Basophils Relative 0 %   Basophils Absolute 0.0 0.0 - 0.1 K/uL    MAU Course  Procedures None  MDM UA and CBC today  Discussed patient with Dr. Corinna LinesGrewal. Ok for discharge at this time. Follow-up in the office within the week Assessment and Plan  A: SIUP at [id number] UTI Back pain in pregnancy   P: Discharge home Warning signs for worsening condition discussed Tylenol PRN for pain advised  Patient advised to follow-up with [facility] within the next week Patient may return to MAU as needed or if her condition were to change or worsen  Marny LowensteinJulie N Alissa Pharr, PA-C  12/05/2016, 1:57 PM

## 2016-12-05 NOTE — MAU Note (Signed)
Pt reprots she has a UTI. Started on Macrobid on Tuesday (urgent Care) and feels like the symptoms are getting worse. Pain in her back now. No fever or chills.

## 2016-12-05 NOTE — Discharge Instructions (Signed)
Muscle Strain A muscle strain (pulled muscle) happens when a muscle is stretched beyond normal length. It happens when a sudden, violent force stretches your muscle too far. Usually, a few of the fibers in your muscle are torn. Muscle strain is common in athletes. Recovery usually takes 1-2 weeks. Complete healing takes 5-6 weeks. Follow these instructions at home:  Follow the PRICE method of treatment to help your injury get better. Do this the first 2-3 days after the injury:  Protect. Protect the muscle to keep it from getting injured again.  Rest. Limit your activity and rest the injured body part.  Ice. Put ice in a plastic bag. Place a towel between your skin and the bag. Then, apply the ice and leave it on from 15-20 minutes each hour. After the third day, switch to moist heat packs.  Compression. Use a splint or elastic bandage on the injured area for comfort. Do not put it on too tightly.  Elevate. Keep the injured body part above the level of your heart.  Only take medicine as told by your doctor.  Warm up before doing exercise to prevent future muscle strains. Contact a doctor if:  You have more pain or puffiness (swelling) in the injured area.  You feel numbness, tingling, or notice a loss of strength in the injured area. This information is not intended to replace advice given to you by your health care provider. Make sure you discuss any questions you have with your health care provider. Document Released: 08/06/2008 Document Revised: 04/04/2016 Document Reviewed: 05/27/2013 Elsevier Interactive Patient Education  2017 [employer] Inc[employer] Back Pain in Pregnancy Introduction Back pain during pregnancy is common. Back pain may be caused by several factors that are related to changes during your pregnancy. Follow these instructions at home: Managing pain, stiffness, and swelling  If directed, apply ice for sudden (acute) back pain.  Put ice in a plastic bag.  Place a towel  between your skin and the bag.  Leave the ice on for 20 minutes, 2-3 times per day.  If directed, apply heat to the affected area before you exercise:  Place a towel between your skin and the heat pack or heating pad.  Leave the heat on for 20-30 minutes.  Remove the heat if your skin turns bright red. This is especially important if you are unable to feel pain, heat, or cold. You may have a greater risk of getting burned. Activity  Exercise as told by your health care provider. Exercising is the best way to prevent or manage back pain.  Listen to your body when lifting. If lifting hurts, ask for help or bend your knees. This uses your leg muscles instead of your back muscles.  Squat down when picking up something from the floor. Do not bend over.  Only use bed rest as told by your health care provider. Bed rest should only be used for the most severe episodes of back pain. Standing, Sitting, and Lying Down  Do not stand in one place for long periods of time.  Use good posture when sitting. Make sure your head rests over your shoulders and is not hanging forward. Use a pillow on your lower back if necessary.  Try sleeping on your side, preferably the left side, with a pillow or two between your legs. If you are sore after a night's rest, your bed may be too soft. A firm mattress may provide more support for your back during pregnancy. General instructions  Do  not wear high heels.  Eat a healthy diet. Try to gain weight within your health care provider's recommendations.  Use a maternity girdle, elastic sling, or back brace as told by your health care provider.  Take over-the-counter and prescription medicines only as told by your health care provider.  Keep all follow-up visits as told by your health care provider. This is important. This includes any visits with any specialists, such as a physical therapist. Contact a health care provider if:  Your back pain interferes with  your daily activities.  You have increasing pain in other parts of your body. Get help right away if:  You develop numbness, tingling, weakness, or problems with the use of your arms or legs.  You develop severe back pain that is not controlled with medicine.  You have a sudden change in bowel or bladder control.  You develop shortness of breath, dizziness, or you faint.  You develop nausea, vomiting, or sweating.  You have back pain that is a rhythmic, cramping pain similar to labor pains. Labor pain is usually 1-2 minutes apart, lasts for about 1 minute, and involves a bearing down feeling or pressure in your pelvis.  You have back pain and your water breaks or you have vaginal bleeding.  You have back pain or numbness that travels down your leg.  Your back pain developed after you fell.  You develop pain on one side of your back.  You see blood in your urine.  You develop skin blisters in the area of your back pain. This information is not intended to replace advice given to you by your health care provider. Make sure you discuss any questions you have with your health care provider. Document Released: 02/05/2006 Document Revised: 04/04/2016 Document Reviewed: 07/12/2015  2017 Elsevier Pregnancy and Urinary Tract Infection WHAT IS A URINARY TRACT INFECTION? A urinary tract infection (UTI) is an infection of any part of the urinary tract. This includes the kidneys, the tubes that connect your kidneys to your bladder (ureters), the bladder, and the tube that carries urine out of your body (urethra). These organs make, store, and get rid of urine in the body. A UTI can be a bladder infection (cystitis) or a kidney infection (pyelonephritis). This infection may be caused by fungi, viruses, and bacteria. Bacteria are the most common cause of UTIs. You are more likely to develop a UTI during pregnancy because:  The physical and hormonal changes your body goes through can make it  easier for bacteria to get into your urinary tract.  Your growing baby puts pressure on your uterus and can affect urine flow. DOES A UTI PLACE MY BABY AT RISK? An untreated UTI during pregnancy could lead to a kidney infection, which can cause health problems that could affect your baby. Possible complications of an untreated UTI include:  Having your baby before 37 weeks of pregnancy (premature).  Having a baby with a low birth weight.  Developing high blood pressure during pregnancy (preeclampsia). WHAT ARE THE SYMPTOMS OF A UTI? Symptoms of a UTI include:  Fever.  Frequent urination or passing small amounts of urine frequently.  Needing to urinate urgently.  Pain or a burning sensation with urination.  Urine that smells bad or unusual.  Cloudy urine.  Pain in the lower abdomen or back.  Trouble urinating.  Blood in the urine.  Vomiting or being less hungry than normal.  Diarrhea or abdominal pain.  Vaginal discharge. WHAT ARE THE TREATMENT OPTIONS FOR A  UTI DURING PREGNANCY? Treatment for this condition may include:  Antibiotic medicines that are safe to take during pregnancy.  Other medicines to treat less common causes of UTI. HOW CAN I PREVENT A UTI? To prevent a UTI:  Go to the bathroom as soon as you feel the need.  Always wipe from front to back.  Wash your genital area with soap and warm water daily.  Empty your bladder before and after sex.  Wear cotton underwear.  Limit your intake of high sugar foods or drinks, such as regular soda, juice, and sweets..  Drink 6-8 glasses of water daily.  Do not wear tight-fitting pants.  Do not douche or use deodorant sprays.  Do not drink alcohol, caffeine, or carbonated drinks. These can irritate the bladder. WHEN SHOULD I SEEK MEDICAL CARE? Seek medical care if:  Your symptoms do not improve or get worse.  You have a fever after two days of treatment.  You have a rash.  You have abnormal  vaginal discharge.  You have back or side pain.  You have chills.  You have nausea and vomiting. WHEN SHOULD I SEEK IMMEDIATE MEDICAL CARE? Seek immediate medical care if you are pregnant and:  You feel contractions in your uterus.  You have lower belly pain.  You have a gush of fluid from your vagina.  You have blood in your urine.  You are vomiting and cannot keep down any medicines or water. This information is not intended to replace advice given to you by your health care provider. Make sure you discuss any questions you have with your health care provider. Document Released: 02/22/2011 Document Revised: 04/01/2016 Document Reviewed: 09/18/2015 Elsevier Interactive Patient Education  2017 [employer]

## 2016-12-12 DIAGNOSIS — Z36 Encounter for antenatal screening for chromosomal anomalies: Secondary | ICD-10-CM | POA: Diagnosis not present

## 2016-12-12 DIAGNOSIS — Z3480 Encounter for supervision of other normal pregnancy, unspecified trimester: Secondary | ICD-10-CM | POA: Diagnosis not present

## 2016-12-27 ENCOUNTER — Inpatient Hospital Stay (HOSPITAL_COMMUNITY)
Admission: AD | Admit: 2016-12-27 | Discharge: 2016-12-28 | Disposition: A | Discharge status: Home / Self Care | Payer: BLUE CROSS/BLUE SHIELD | Source: Ambulatory Visit | Attending: Obstetrics and Gynecology | Admitting: Obstetrics and Gynecology

## 2016-12-27 ENCOUNTER — Encounter (HOSPITAL_COMMUNITY): Payer: Self-pay

## 2016-12-27 DIAGNOSIS — K219 Gastro-esophageal reflux disease without esophagitis: Secondary | ICD-10-CM | POA: Diagnosis not present

## 2016-12-27 DIAGNOSIS — O26891 Other specified pregnancy related conditions, first trimester: Secondary | ICD-10-CM | POA: Insufficient documentation

## 2016-12-27 DIAGNOSIS — O99341 Other mental disorders complicating pregnancy, first trimester: Secondary | ICD-10-CM | POA: Diagnosis not present

## 2016-12-27 DIAGNOSIS — R42 Dizziness and giddiness: Secondary | ICD-10-CM | POA: Diagnosis not present

## 2016-12-27 DIAGNOSIS — F419 Anxiety disorder, unspecified: Secondary | ICD-10-CM | POA: Insufficient documentation

## 2016-12-27 DIAGNOSIS — Z3A10 10 weeks gestation of pregnancy: Secondary | ICD-10-CM | POA: Diagnosis not present

## 2016-12-27 DIAGNOSIS — F329 Major depressive disorder, single episode, unspecified: Secondary | ICD-10-CM | POA: Insufficient documentation

## 2016-12-27 DIAGNOSIS — G4700 Insomnia, unspecified: Secondary | ICD-10-CM | POA: Insufficient documentation

## 2016-12-27 LAB — CBC
HEMATOCRIT: 38.8 % (ref 36.0–46.0)
HEMOGLOBIN: 13.1 g/dL (ref 12.0–15.0)
MCH: 26.8 pg (ref 26.0–34.0)
MCHC: 33.8 g/dL (ref 30.0–36.0)
MCV: 79.5 fL (ref 78.0–100.0)
Platelets: 170 10*3/uL (ref 150–400)
RBC: 4.88 MIL/uL (ref 3.87–5.11)
RDW: 14.2 % (ref 11.5–15.5)
WBC: 8.8 10*3/uL (ref 4.0–10.5)

## 2016-12-27 LAB — URINALYSIS, ROUTINE W REFLEX MICROSCOPIC
Bilirubin Urine: NEGATIVE
GLUCOSE, UA: NEGATIVE mg/dL
Hgb urine dipstick: NEGATIVE
Ketones, ur: NEGATIVE mg/dL
LEUKOCYTES UA: NEGATIVE
Nitrite: NEGATIVE
PH: 6 (ref 5.0–8.0)
Protein, ur: NEGATIVE mg/dL
Specific Gravity, Urine: 1.01 (ref 1.005–1.030)

## 2016-12-27 LAB — BASIC METABOLIC PANEL
Anion gap: 7 (ref 5–15)
CHLORIDE: 105 mmol/L (ref 101–111)
CO2: 25 mmol/L (ref 22–32)
CREATININE: 0.53 mg/dL (ref 0.44–1.00)
Calcium: 8.9 mg/dL (ref 8.9–10.3)
GFR calc non Af Amer: >60 mL/min (ref >60)
Glucose, Bld: 83 mg/dL (ref 65–99)
Potassium: 3.7 mmol/L (ref 3.5–5.1)
Sodium: 137 mmol/L (ref 135–145)

## 2016-12-27 NOTE — MAU Provider Note (Signed)
Chief Complaint: Dizziness; Blurred Vision; Tachycardia; and Numbness   SUBJECTIVE HPI: Lisa Grimes is a 21 y.o. G1P0 at [redacted]w[redacted]d who presents to [facility] reporting dizziness, and low BP.  Patient felt dizzy all day, then around 6-7pm, noticed more dizziness, blurry vision. Took BP at home, it was 108/50 with HR 112. Numbness occurred about 10-20 min later. No LOC. No pre-syncope. Patient has eaten today, only snacks but no meals (mostly chips and junk food). Drank one caffeinated drink of mountain dew. Only about 1-2 bottles of water today, and a few other juices. Denies vomiting, feels nauseous on/off. Denies diarrhea. Denies VB. Denies abdominal pain. Denies abnormal vaginal discharge. Denies F/C. No urinary symptoms.   Not having any symptoms like earlier now, just slightly dizzy (like room is spinning). Has had this dizziness before, told was vertigo.      Past Medical History:  Diagnosis Date  . Abnormal white blood cell count   . Acute bronchitis   . Anxiety   . Depression with anxiety   . Dye allergic reaction   . Epistaxis   . Fatigue   . Gastroesophageal reflux disease   . Hypoglycemia   . Influenza A   . Insomnia   . Major depressive disorder, recurrent episode, moderate with anxious distress (HCC)   . Menstrual headache   . Migraine headache   . Mild major depression (HCC)   . Palpitations   . Panic disorder with agoraphobia and severe panic attacks   . Parent-child relational problem   . Rhinitis, allergic   . Screening for depression   . UTI (lower urinary tract infection)   . Viral syndrome    OB History  Gravida Para Term Preterm AB Living  1            SAB TAB Ectopic Multiple Live Births               # Outcome Date GA Lbr Len/2nd Weight Sex Delivery Anes PTL Lv  1 Current              Past Surgical History:  Procedure Laterality Date  . EYE SURGERY    . MOUTH SURGERY  2010  . STRABISMUS SURGERY  1997  . TONSILLECTOMY  2003  . tubes  in ear  2000   Social History   Social History  . Marital status: Married    Spouse name: N/A  . Number of children: N/A  . Years of education: N/A   Occupational History  . Not on file.   Social History Main Topics  . Smoking status: Never Smoker  . Smokeless tobacco: Never Used  . Alcohol use No  . Drug use: No  . Sexual activity: Not on file   Other Topics Concern  . Not on file   Social History Narrative  . No narrative on file   No current facility-administered medications on file prior to encounter.    Current Outpatient Prescriptions on File Prior to Encounter  Medication Sig Dispense Refill  . Prenatal Vit-Fe Fumarate-FA (MULTIVITAMIN-PRENATAL) 27-0.8 MG TABS tablet Take 1 tablet by mouth daily at 12 noon.     Allergies  Allergen Reactions  . Amoxicillin Hives    Has patient had a PCN reaction causing immediate rash, facial/tongue/throat swelling, SOB or lightheadedness with hypotension: Yes Has patient had a PCN reaction causing severe rash involving mucus membranes or skin necrosis: No Has patient had a PCN reaction that required hospitalization No Has patient had a  PCN reaction occurring within the last 10 years: No If all of the above answers are "NO", then may proceed with Cephalosporin use.   Marland Kitchen. Penicillins Hives    Has patient had a PCN reaction causing immediate rash, facial/tongue/throat swelling, SOB or lightheadedness with hypotension: Yes Has patient had a PCN reaction causing severe rash involving mucus membranes or skin necrosis: No Has patient had a PCN reaction that required hospitalization No Has patient had a PCN reaction occurring within the last 10 years: no If all of the above answers are "NO", then may proceed with Cephalosporin use.   . Red Dye     possible allergy    I have reviewed the past Medical Hx, Surgical Hx, Social Hx, Allergies and Medications.   REVIEW OF SYSTEMS  A comprehensive ROS was negative except per HPI.     OBJECTIVE Patient Vitals for the past 24 hrs:  BP Temp Pulse Resp SpO2 Height Weight  12/28/16 0101 120/65 - 64 16 - - -  12/27/16 2325 125/66 - 78 - - - -  12/27/16 2323 130/68 - 74 - - - -  12/27/16 2322 127/72 - 83 18 - - -  12/27/16 2133 134/72 98.2 F (36.8 C) 92 18 100 % 5\' 3"  (1.6 m) 183 lb 9.6 oz (83.3 kg)    PHYSICAL EXAM Constitutional: Well-developed, well-nourished female in no acute distress.  Cardiovascular: normal rate, rhythm, no murmrus Respiratory: normal rate and effort. CTAB GI: Abd soft, non-tender, non-distended. Pos BS x 4 MS: Extremities nontender, no edema, normal ROM Neurologic: Alert and oriented x 4.  GU: Neg CVAT.   LAB RESULTS Results for orders placed or performed during the hospital encounter of 12/27/16 (from the past 24 hour(s))  Urinalysis, Routine w reflex microscopic     Status: None   Collection Time: 12/27/16  9:40 PM  Result Value Ref Range   Color, Urine YELLOW YELLOW   APPearance CLEAR CLEAR   Specific Gravity, Urine 1.010 1.005 - 1.030   pH 6.0 5.0 - 8.0   Glucose, UA NEGATIVE NEGATIVE mg/dL   Hgb urine dipstick NEGATIVE NEGATIVE   Bilirubin Urine NEGATIVE NEGATIVE   Ketones, ur NEGATIVE NEGATIVE mg/dL   Protein, ur NEGATIVE NEGATIVE mg/dL   Nitrite NEGATIVE NEGATIVE   Leukocytes, UA NEGATIVE NEGATIVE  CBC     Status: None   Collection Time: 12/27/16 10:33 PM  Result Value Ref Range   WBC 8.8 4.0 - 10.5 K/uL   RBC 4.88 3.87 - 5.11 MIL/uL   Hemoglobin 13.1 12.0 - 15.0 g/dL   HCT 19.138.8 47.836.0 - 29.546.0 %   MCV 79.5 78.0 - 100.0 fL   MCH 26.8 26.0 - 34.0 pg   MCHC 33.8 30.0 - 36.0 g/dL   RDW 62.114.2 30.811.5 - 65.715.5 %   Platelets 170 150 - 400 K/uL  Basic metabolic panel     Status: Abnormal   Collection Time: 12/27/16 10:33 PM  Result Value Ref Range   Sodium 137 135 - 145 mmol/L   Potassium 3.7 3.5 - 5.1 mmol/L   Chloride 105 101 - 111 mmol/L   CO2 25 22 - 32 mmol/L   Glucose, Bld 83 65 - 99 mg/dL   BUN <5 (L) 6 - 20 mg/dL    Creatinine, Ser 8.460.53 0.44 - 1.00 mg/dL   Calcium 8.9 8.9 - 96.210.3 mg/dL   GFR calc non Af Amer >60 >60 mL/min   GFR calc Af Amer >60 >60 mL/min   Anion gap  7 5 - 15    IMAGING No results found.  MAU COURSE Orthostatics - normal VSS CBC - No sign of anemia BMP - No electrolyte abnormalities, CBG 83 FHT - 170 bpm EKG - NSR, no ST/T wave changes, no arrhythmias  MDM Plan of care reviewed with patient, including labs and tests ordered and medical treatment. Discussed with patient the importance of a healthy her diet during her pregnancy as well as to maintain hydration with water. Encouraged patient to not have this much sugar in her diet, but to instead increased protein. Also discussed with patient to decrease her caffeine intake especially with choices like [facility]. Likely patient had a vagal response. She is stable. Given history of vertigo sending home with meclizine to help with symptoms if occurs again in the future.   ASSESSMENT 1. Vertigo     PLAN Discharge home in stable condition. Meclizine Improve diet Encouraged PO hydration  Follow-up Information    [facility]. Schedule an appointment as soon as possible for a visit in 1 week(s).   Why:  Follow up OB appointment Contact information: [address] [place] [address] [phone]          Allergies as of 12/28/2016      Reactions   Amoxicillin Hives   Has patient had a PCN reaction causing immediate rash, facial/tongue/throat swelling, SOB or lightheadedness with hypotension: Yes Has patient had a PCN reaction causing severe rash involving mucus membranes or skin necrosis: No Has patient had a PCN reaction that required hospitalization No Has patient had a PCN reaction occurring within the last 10 years: No If all of the above answers are "NO", then may proceed with Cephalosporin use.   Penicillins Hives   Has patient had a PCN reaction causing immediate rash,  facial/tongue/throat swelling, SOB or lightheadedness with hypotension: Yes Has patient had a PCN reaction causing severe rash involving mucus membranes or skin necrosis: No Has patient had a PCN reaction that required hospitalization No Has patient had a PCN reaction occurring within the last 10 years: no If all of the above answers are "NO", then may proceed with Cephalosporin use.   Red Dye    possible allergy      Medication List    TAKE these medications   meclizine 25 MG tablet Commonly known as:  ANTIVERT Take 1 tablet (25 mg total) by mouth 3 (three) times daily as needed for dizziness.   multivitamin-prenatal 27-0.8 MG Tabs tablet Take 1 tablet by mouth daily at 12 noon.        Jen Mow, DO OB Fellow 12/28/15 11:48 PM

## 2016-12-27 NOTE — Progress Notes (Signed)
G1 @ 11.[redacted] wksga. Presents to triage for dizziness and L-sided knumbness of face and arm with blurred vision. Denies LOF or bleeding. VSS. See flow sheet for details.  Hx: heart murrmur   Surgeries: eyes, tonsils, adnoids removal 2001, ET 2003, oral 2011

## 2016-12-27 NOTE — MAU Note (Addendum)
Feeling dizzy all day. Took b/p at home and was 108/50 and HR 112. L side of face and L arm was numb for .and vision went blurry. Vision better now but not 100 percent normal. Called on-call nurse and told to come in. L side of face still feels alittle funny like when leg has been asleep and starting to get feeling back. L arm ok. Pt ambulated without difficulty to Triage. Hx heart murmur

## 2016-12-28 DIAGNOSIS — R42 Dizziness and giddiness: Secondary | ICD-10-CM

## 2016-12-28 MED ORDER — MECLIZINE HCL 25 MG PO TABS: 25.0000 mg | ORAL_TABLET | Freq: Three times a day (TID) | ORAL | 0 refills | Status: DC | PRN

## 2016-12-28 NOTE — Discharge Instructions (Signed)
Dizziness Dizziness is a common problem. It makes you feel unsteady or lightheaded. You may feel like you are about to pass out (faint). Dizziness can lead to injury if you stumble or fall. Anyone can get dizzy, but dizziness is more common in older adults. This condition can be caused by a number of things, including:  Medicines.  Dehydration.  Illness. Follow these instructions at home: Following these instructions may help with your condition: Eating and drinking  Drink enough fluid to keep your pee (urine) clear or pale yellow. This helps to keep you from getting dehydrated. Try to drink more clear fluids, such as water.  Do not drink alcohol.  Limit how much caffeine you drink or eat if told by your doctor.  Limit how much salt you drink or eat if told by your doctor. Activity  Avoid making quick movements.  When you stand up from sitting in a chair, steady yourself until you feel okay.  In the morning, first sit up on the side of the bed. When you feel okay, stand slowly while you hold onto something. Do this until you know that your balance is fine.  Move your legs often if you need to stand in one place for a long time. Tighten and relax your muscles in your legs while you are standing.  Do not drive or use heavy machinery if you feel dizzy.  Avoid bending down if you feel dizzy. Place items in your home so that they are easy for you to reach without leaning over. Lifestyle  Do not use any tobacco products, including cigarettes, chewing tobacco, or electronic cigarettes. If you need help quitting, ask your doctor.  Try to lower your stress level, such as with yoga or meditation. Talk with your doctor if you need help. General instructions  Watch your dizziness for any changes.  Take medicines only as told by your doctor. Talk with your doctor if you think that your dizziness is caused by a medicine that you are taking.  Tell a friend or a family member that you are  feeling dizzy. If he or she notices any changes in your behavior, have this person call your doctor.  Keep all follow-up visits as told by your doctor. This is important. Contact a doctor if:  Your dizziness does not go away.  Your dizziness or light-headedness gets worse.  You feel sick to your stomach (nauseous).  You have trouble hearing.  You have new symptoms.  You are unsteady on your feet or you feel like the room is spinning. Get help right away if:  You throw up (vomit) or have diarrhea and are unable to eat or drink anything.  You have trouble:  Talking.  Walking.  Swallowing.  Using your arms, hands, or legs.  You feel generally weak.  You are not thinking clearly or you have trouble forming sentences. It may take a friend or family member to notice this.  You have:  Chest pain.  Pain in your belly (abdomen).  Shortness of breath.  Sweating.  Your vision changes.  You are bleeding.  You have a headache.  You have neck pain or a stiff neck.  You have a fever. This information is not intended to replace advice given to you by your health care provider. Make sure you discuss any questions you have with your health care provider. Document Released: 10/17/2011 Document Revised: 04/04/2016 Document Reviewed: 10/24/2014 Elsevier Interactive Patient Education  2017 Elsevier Inc.  

## 2017-01-14 DIAGNOSIS — Z3491 Encounter for supervision of normal pregnancy, unspecified, first trimester: Secondary | ICD-10-CM | POA: Diagnosis not present

## 2017-01-14 DIAGNOSIS — Z3682 Encounter for antenatal screening for nuchal translucency: Secondary | ICD-10-CM | POA: Diagnosis not present

## 2017-01-14 DIAGNOSIS — Z36 Encounter for antenatal screening for chromosomal anomalies: Secondary | ICD-10-CM | POA: Diagnosis not present

## 2017-02-06 DIAGNOSIS — Z3480 Encounter for supervision of other normal pregnancy, unspecified trimester: Secondary | ICD-10-CM | POA: Diagnosis not present

## 2017-02-06 DIAGNOSIS — Z363 Encounter for antenatal screening for malformations: Secondary | ICD-10-CM | POA: Diagnosis not present

## 2017-04-09 ENCOUNTER — Encounter: Payer: Self-pay | Admitting: *Deleted

## 2017-04-22 ENCOUNTER — Encounter: Payer: Self-pay | Admitting: Women's Health

## 2017-04-22 ENCOUNTER — Telehealth: Payer: Self-pay | Admitting: *Deleted

## 2017-04-22 ENCOUNTER — Ambulatory Visit (INDEPENDENT_AMBULATORY_CARE_PROVIDER_SITE_OTHER): Payer: BLUE CROSS/BLUE SHIELD | Admitting: Women's Health

## 2017-04-22 ENCOUNTER — Ambulatory Visit: Payer: BLUE CROSS/BLUE SHIELD | Admitting: *Deleted

## 2017-04-22 VITALS — BP 132/80 | HR 102 | Wt 189.0 lb

## 2017-04-22 DIAGNOSIS — Z3402 Encounter for supervision of normal first pregnancy, second trimester: Secondary | ICD-10-CM

## 2017-04-22 DIAGNOSIS — O0990 Supervision of high risk pregnancy, unspecified, unspecified trimester: Secondary | ICD-10-CM | POA: Insufficient documentation

## 2017-04-22 DIAGNOSIS — Z363 Encounter for antenatal screening for malformations: Secondary | ICD-10-CM

## 2017-04-22 DIAGNOSIS — Z331 Pregnant state, incidental: Secondary | ICD-10-CM

## 2017-04-22 DIAGNOSIS — R030 Elevated blood-pressure reading, without diagnosis of hypertension: Secondary | ICD-10-CM | POA: Diagnosis not present

## 2017-04-22 DIAGNOSIS — O2341 Unspecified infection of urinary tract in pregnancy, first trimester: Secondary | ICD-10-CM | POA: Insufficient documentation

## 2017-04-22 DIAGNOSIS — F418 Other specified anxiety disorders: Secondary | ICD-10-CM

## 2017-04-22 DIAGNOSIS — Z1389 Encounter for screening for other disorder: Secondary | ICD-10-CM

## 2017-04-22 LAB — POCT URINALYSIS DIPSTICK
Blood, UA: NEGATIVE
GLUCOSE UA: NEGATIVE
KETONES UA: NEGATIVE
LEUKOCYTES UA: NEGATIVE
Nitrite, UA: NEGATIVE
PROTEIN UA: NEGATIVE

## 2017-04-22 NOTE — Progress Notes (Signed)
Subjective:  Lisa Grimes is a 21 y.o. G1P0 Caucasian female at [redacted]w[redacted]d by 6wk u/s, being seen today for her first obstetrical visit w/ [place]. Transferring from [facility] where she started her care @ 8wks. Transferring d/t just recently qualifying for preg [facility], and they do not accept. Her obstetrical history is significant for primigravida; depression/anxiety- was on celexa, klonopin and prn xanax as well as counseling in [place] 1-58yr prior to pregnancy- off meds and no counseling now- feels she is doing OK/coping well.  Reviewed records from [facility], pregnancy has been complicated by 1st trimester uti, incomplete anatomy u/s, bp's have been normal. Pregnancy history fully reviewed.  Patient reports fatigue. Denies vb, cramping, uti s/s, abnormal/malodorous vag d/c, or vulvovaginal itching/irritation. Denies visual changes, ruq/epigastric pain, n/v.  Has had occ mild headaches lately- doesn't take apap- eventually goes away.   BP 138/90   Pulse (!) 102   Wt 189 lb (85.7 kg)   LMP 10/10/2016   BMI 33.48 kg/m   1st BP 136/98, recheck 138/90 Recheck at end of visit 132/80  HISTORY: OB History  Gravida Para Term Preterm AB Living  1            SAB TAB Ectopic Multiple Live Births               # Outcome Date GA Lbr Len/2nd Weight Sex Delivery Anes PTL Lv  1 Current              Past Medical History:  Diagnosis Date  . Abnormal white blood cell count   . Acute bronchitis   . Anxiety   . Chlamydia 2016  . Depression with anxiety   . Dye allergic reaction   . Epistaxis   . Fatigue   . Gastroesophageal reflux disease   . Hypoglycemia   . Influenza A   . Insomnia   . Major depressive disorder, recurrent episode, moderate with anxious distress (HCC)   . Menstrual headache   . Migraine headache   . Mild major depression (HCC)   . Palpitations   . Panic disorder with agoraphobia and severe panic attacks   . Parent-child relational problem   . Rhinitis, allergic   .  Screening for depression   . UTI (lower urinary tract infection)   . Viral syndrome    Past Surgical History:  Procedure Laterality Date  . EYE SURGERY    . MOUTH SURGERY  2010  . STRABISMUS SURGERY  1997  . TONSILLECTOMY  2003  . tubes in ear  2000   Family History  Problem Relation Age of Onset  . Depression Mother   . Anxiety disorder Mother   . Hypertension Mother   . Alcohol abuse Father   . Hypertension Father   . Bipolar disorder Maternal Aunt   . Cancer Maternal Aunt        breast  . Hypertension Maternal Aunt   . Diabetes Maternal Aunt   . Bipolar disorder Maternal Uncle   . Hypertension Maternal Uncle   . Bipolar disorder Paternal Uncle   . Depression Maternal Grandmother   . Stroke Maternal Grandmother   . Hypertension Maternal Grandmother   . Cancer Maternal Grandmother        breast  . Heart disease Maternal Grandmother   . Depression Paternal Grandmother   . Bipolar disorder Paternal Grandmother   . ADD / ADHD Cousin   . Depression Cousin   . Heart attack Maternal Aunt   . Heart  disease Maternal Aunt   . Hypertension Maternal Aunt   . Heart attack Maternal Uncle   . Heart disease Maternal Uncle   . Diabetes Maternal Uncle   . Stroke Maternal Grandfather   . Hypertension Maternal Grandfather   . Heart disease Maternal Grandfather     Exam   System:     General: Well developed & nourished, no acute distress   Skin: Warm & dry, normal coloration and turgor, no rashes   Neurologic: Alert & oriented, normal mood   Cardiovascular: Regular rate & rhythm   Respiratory: Effort & rate normal, LCTAB, acyanotic   Abdomen: Soft, non tender   Extremities: normal strength, tone  Thin prep pap smear neg 12/12/16 FHR: 145 via doppler FH: 27cm   Assessment:   Pregnancy: G1P0 Patient Active Problem List   Diagnosis Date Noted  . Supervision of normal first pregnancy 04/22/2017  . Anxiety 02/20/2015  . Bleeding from the nose 02/20/2015  . Fatigue  02/20/2015  . Acid reflux 02/20/2015  . Headache, menstrual migraine 02/20/2015  . Cardiac murmur 02/20/2015  . H/O cardiovascular disorder 02/20/2015  . Depression, major, recurrent, moderate (HCC) 02/20/2015  . Hypoglycemia 02/20/2015  . Adaptive colitis 02/20/2015  . Headache, migraine 02/20/2015  . Intermittent tremor 02/20/2015  . Panic disorder with agoraphobia and severe panic attacks 02/20/2015    2849w0d G1P0 New OB visit, tx from [facility] Elevated bp today Depression/anxiety  Plan:  Initial labs reviewed Will get pre-e labs today Continue prenatal vitamins Problem list reviewed and updated Reviewed ptl s/s, fkc, pre-e s/s Recommended Tdap at HD/PCP per CDC guidelines.  Reviewed recommended weight gain based on pre-gravid BMI Encouraged well-balanced diet Genetic Screening discussed Integrated Screen: results reviewed, neg Cystic fibrosis screening discussed results reviewed, neg Ultrasound discussed; fetal survey: results reviewed, incomplete Follow up in 2 days for visit/bp check, then 1wk for visit, pn2, and f/u anatomy u/s Let us know if feels needs to restart antidepressants or counseling [facility]NC completed  Marge DuncansBooker, Maryln Eastham Randall CNM, [facility] 04/22/2017 3:32 PM

## 2017-04-22 NOTE — Patient Instructions (Signed)
You will have your sugar test next visit.  Please do not eat or drink anything after midnight the night before you come, not even water.  You will be here for at least two hours.     Call the office [phone]) or go to [facility] if:  You begin to have strong, frequent contractions  Your water breaks.  Sometimes it is a big gush of fluid, sometimes it is just a trickle that keeps getting your panties wet or running down your legs  You have vaginal bleeding.  It is normal to have a small amount of spotting if your cervix was checked.   You don't feel your baby moving like normal.  If you don't, get you something to eat and drink and lay down and focus on feeling your baby move.  You should feel at least 10 movements in 2 hours.  If you don't, you should call the office or go to [facility].    Call the office [phone]) or [facility] to [facility] for these signs of pre-eclampsia:  Severe headache that does not go away with Tylenol  Visual changes- seeing spots, double, blurred vision  Pain under your right breast or upper abdomen that does not go away with Tums or heartburn medicine  Nausea and/or vomiting  Severe swelling in your hands, feet, and face     Tdap Vaccine  It is recommended that you get the Tdap vaccine during the third trimester of EACH pregnancy to help protect your baby from getting pertussis (whooping cough)  27-36 weeks is the BEST time to do this so that you can pass the protection on to your baby. During pregnancy is better than after pregnancy, but if you are unable to get it during pregnancy it will be offered at the hospital.   You can get this vaccine at the health department or your family doctor  Everyone who will be around your baby should also be up-to-date on their vaccines. Adults (who are not pregnant) only need 1 dose of Tdap during adulthood.    Third Trimester of Pregnancy The third trimester is from week 28 through week 40 (months 7  through 9). The third trimester is a time when the unborn baby (fetus) is growing rapidly. At the end of the ninth month, the fetus is about 20 inches in length and weighs 6-10 pounds. Body changes during your third trimester Your body will continue to go through many changes during pregnancy. The changes vary from woman to woman. During the third trimester:  Your weight will continue to increase. You can expect to gain 25-35 pounds (11-16 kg) by the end of the pregnancy.  You may begin to get stretch marks on your hips, abdomen, and breasts.  You may urinate more often because the fetus is moving lower into your pelvis and pressing on your bladder.  You may develop or continue to have heartburn. This is caused by increased hormones that slow down muscles in the digestive tract.  You may develop or continue to have constipation because increased hormones slow digestion and cause the muscles that push waste through your intestines to relax.  You may develop hemorrhoids. These are swollen veins (varicose veins) in the rectum that can itch or be painful.  You may develop swollen, bulging veins (varicose veins) in your legs.  You may have increased body aches in the pelvis, back, or thighs. This is due to weight gain and increased hormones that are relaxing your joints.  You may have changes in your hair. These can include thickening of your hair, rapid growth, and changes in texture. Some women also have hair loss during or after pregnancy, or hair that feels dry or thin. Your hair will most likely return to normal after your baby is born.  Your breasts will continue to grow and they will continue to become tender. A yellow fluid (colostrum) may leak from your breasts. This is the first milk you are producing for your baby.  Your belly button may stick out.  You may notice more swelling in your hands, face, or ankles.  You may have increased tingling or numbness in your hands, arms, and  legs. The skin on your belly may also feel numb.  You may feel short of breath because of your expanding uterus.  You may have more problems sleeping. This can be caused by the size of your belly, increased need to urinate, and an increase in your body's metabolism.  You may notice the fetus "dropping," or moving lower in your abdomen (lightening).  You may have increased vaginal discharge.  You may notice your joints feel loose and you may have pain around your pelvic bone.  What to expect at prenatal visits You will have prenatal exams every 2 weeks until week 36. Then you will have weekly prenatal exams. During a routine prenatal visit:  You will be weighed to make sure you and the baby are growing normally.  Your blood pressure will be taken.  Your abdomen will be measured to track your baby's growth.  The fetal heartbeat will be listened to.  Any test results from the previous visit will be discussed.  You may have a cervical check near your due date to see if your cervix has softened or thinned (effaced).  You will be tested for Group B streptococcus. This happens between 35 and 37 weeks.  Your health care provider may ask you:  What your birth plan is.  How you are feeling.  If you are feeling the baby move.  If you have had any abnormal symptoms, such as leaking fluid, bleeding, severe headaches, or abdominal cramping.  If you are using any tobacco products, including cigarettes, chewing tobacco, and electronic cigarettes.  If you have any questions.  Other tests or screenings that may be performed during your third trimester include:  Blood tests that check for low iron levels (anemia).  Fetal testing to check the health, activity level, and growth of the fetus. Testing is done if you have certain medical conditions or if there are problems during the pregnancy.  Nonstress test (NST). This test checks the health of your baby to make sure there are no signs of  problems, such as the baby not getting enough oxygen. During this test, a belt is placed around your belly. The baby is made to move, and its heart rate is monitored during movement.  What is false labor? False labor is a condition in which you feel small, irregular tightenings of the muscles in the womb (contractions) that usually go away with rest, changing position, or drinking water. These are called Braxton Hicks contractions. Contractions may last for hours, days, or even weeks before true labor sets in. If contractions come at regular intervals, become more frequent, increase in intensity, or become painful, you should see your health care provider. What are the signs of labor?  Abdominal cramps.  Regular contractions that start at 10 minutes apart and become stronger and more frequent  with time.  Contractions that start on the top of the uterus and spread down to the lower abdomen and back.  Increased pelvic pressure and dull back pain.  A watery or bloody mucus discharge that comes from the vagina.  Leaking of amniotic fluid. This is also known as your "water breaking." It could be a slow trickle or a gush. Let your health care provider know if it has a color or strange odor. If you have any of these signs, call your health care provider right away, even if it is before your due date. Follow these instructions at home: Medicines  Follow your health care provider's instructions regarding medicine use. Specific medicines may be either safe or unsafe to take during pregnancy.  Take a prenatal vitamin that contains at least 600 micrograms (mcg) of folic acid.  If you develop constipation, try taking a stool softener if your health care provider approves. Eating and drinking  Eat a balanced diet that includes fresh fruits and vegetables, whole grains, good sources of protein such as meat, eggs, or tofu, and low-fat dairy. Your health care provider will help you determine the amount of  weight gain that is right for you.  Avoid raw meat and uncooked cheese. These carry germs that can cause birth defects in the baby.  If you have low calcium intake from food, talk to your health care provider about whether you should take a daily calcium supplement.  Eat four or five small meals rather than three large meals a day.  Limit foods that are high in fat and processed sugars, such as fried and sweet foods.  To prevent constipation: ? Drink enough fluid to keep your urine clear or pale yellow. ? Eat foods that are high in fiber, such as fresh fruits and vegetables, whole grains, and beans. Activity  Exercise only as directed by your health care provider. Most women can continue their usual exercise routine during pregnancy. Try to exercise for 30 minutes at least 5 days a week. Stop exercising if you experience uterine contractions.  Avoid heavy lifting.  Do not exercise in extreme heat or humidity, or at high altitudes.  Wear low-heel, comfortable shoes.  Practice good posture.  You may continue to have sex unless your health care provider tells you otherwise. Relieving pain and discomfort  Take frequent breaks and rest with your legs elevated if you have leg cramps or low back pain.  Take warm sitz baths to soothe any pain or discomfort caused by hemorrhoids. Use hemorrhoid cream if your health care provider approves.  Wear a good support bra to prevent discomfort from breast tenderness.  If you develop varicose veins: ? Wear support pantyhose or compression stockings as told by your healthcare provider. ? Elevate your feet for 15 minutes, 3-4 times a day. Prenatal care  Write down your questions. Take them to your prenatal visits.  Keep all your prenatal visits as told by your health care provider. This is important. Safety  Wear your seat belt at all times when driving.  Make a list of emergency phone numbers, including numbers for family, friends, the  hospital, and police and fire departments. General instructions  Avoid cat litter boxes and soil used by cats. These carry germs that can cause birth defects in the baby. If you have a cat, ask someone to clean the litter box for you.  Do not travel far distances unless it is absolutely necessary and only with the approval of your health care  provider.  Do not use hot tubs, steam rooms, or saunas.  Do not drink alcohol.  Do not use any products that contain nicotine or tobacco, such as cigarettes and e-cigarettes. If you need help quitting, ask your health care provider.  Do not use any medicinal herbs or unprescribed drugs. These chemicals affect the formation and growth of the baby.  Do not douche or use tampons or scented sanitary pads.  Do not cross your legs for long periods of time.  To prepare for the arrival of your baby: ? Take prenatal classes to understand, practice, and ask questions about labor and delivery. ? Make a trial run to the hospital. ? Visit the hospital and tour the maternity area. ? Arrange for maternity or paternity leave through employers. ? Arrange for family and friends to take care of pets while you are in the hospital. ? Purchase a rear-facing car seat and make sure you know how to install it in your car. ? Pack your hospital bag. ? Prepare the baby's nursery. Make sure to remove all pillows and stuffed animals from the baby's crib to prevent suffocation.  Visit your dentist if you have not gone during your pregnancy. Use a soft toothbrush to brush your teeth and be gentle when you floss. Contact a health care provider if:  You are unsure if you are in labor or if your water has broken.  You become dizzy.  You have mild pelvic cramps, pelvic pressure, or nagging pain in your abdominal area.  You have lower back pain.  You have persistent nausea, vomiting, or diarrhea.  You have an unusual or bad smelling vaginal discharge.  You have pain when  you urinate. Get help right away if:  Your water breaks before 37 weeks.  You have regular contractions less than 5 minutes apart before 37 weeks.  You have a fever.  You are leaking fluid from your vagina.  You have spotting or bleeding from your vagina.  You have severe abdominal pain or cramping.  You have rapid weight loss or weight gain.  You have shortness of breath with chest pain.  You notice sudden or extreme swelling of your face, hands, ankles, feet, or legs.  Your baby makes fewer than 10 movements in 2 hours.  You have severe headaches that do not go away when you take medicine.  You have vision changes. Summary  The third trimester is from week 28 through week 40, months 7 through 9. The third trimester is a time when the unborn baby (fetus) is growing rapidly.  During the third trimester, your discomfort may increase as you and your baby continue to gain weight. You may have abdominal, leg, and back pain, sleeping problems, and an increased need to urinate.  During the third trimester your breasts will keep growing and they will continue to become tender. A yellow fluid (colostrum) may leak from your breasts. This is the first milk you are producing for your baby.  False labor is a condition in which you feel small, irregular tightenings of the muscles in the womb (contractions) that eventually go away. These are called Braxton Hicks contractions. Contractions may last for hours, days, or even weeks before true labor sets in.  Signs of labor can include: abdominal cramps; regular contractions that start at 10 minutes apart and become stronger and more frequent with time; watery or bloody mucus discharge that comes from the vagina; increased pelvic pressure and dull back pain; and leaking of amniotic  fluid. This information is not intended to replace advice given to you by your health care provider. Make sure you discuss any questions you have with your health care  provider. Document Released: 10/22/2001 Document Revised: 04/04/2016 Document Reviewed: 12/29/2012 Elsevier Interactive Patient Education  2017 [employer].

## 2017-04-22 NOTE — Telephone Encounter (Signed)
Attempted to call pt to let her know that we need more urine for a protein/creatinine ratio. Voicemail full so unable to leave message. Will attempt to call back tomorrow.

## 2017-04-23 ENCOUNTER — Other Ambulatory Visit: Payer: BLUE CROSS/BLUE SHIELD

## 2017-04-23 ENCOUNTER — Other Ambulatory Visit: Payer: Self-pay | Admitting: Women's Health

## 2017-04-23 DIAGNOSIS — Z331 Pregnant state, incidental: Secondary | ICD-10-CM | POA: Diagnosis not present

## 2017-04-23 DIAGNOSIS — R030 Elevated blood-pressure reading, without diagnosis of hypertension: Secondary | ICD-10-CM | POA: Diagnosis not present

## 2017-04-23 DIAGNOSIS — O10919 Unspecified pre-existing hypertension complicating pregnancy, unspecified trimester: Secondary | ICD-10-CM

## 2017-04-23 LAB — COMPREHENSIVE METABOLIC PANEL
A/G RATIO: 1.1 — AB (ref 1.2–2.2)
ALT: 16 IU/L (ref 0–32)
AST: 18 IU/L (ref 0–40)
Albumin: 3.7 g/dL (ref 3.5–5.5)
Alkaline Phosphatase: 120 IU/L — ABNORMAL HIGH (ref 39–117)
BILIRUBIN TOTAL: 0.2 mg/dL (ref 0.0–1.2)
BUN/Creatinine Ratio: 10 (ref 9–23)
BUN: 6 mg/dL (ref 6–20)
CHLORIDE: 101 mmol/L (ref 96–106)
CO2: 22 mmol/L (ref 20–29)
Calcium: 9.4 mg/dL (ref 8.7–10.2)
Creatinine, Ser: 0.61 mg/dL (ref 0.57–1.00)
GFR calc non Af Amer: 129 mL/min/{1.73_m2} (ref >59)
GFR, EST AFRICAN AMERICAN: 149 mL/min/{1.73_m2} (ref >59)
Globulin, Total: 3.3 g/dL (ref 1.5–4.5)
Glucose: 82 mg/dL (ref 65–99)
POTASSIUM: 4.2 mmol/L (ref 3.5–5.2)
Sodium: 137 mmol/L (ref 134–144)
TOTAL PROTEIN: 7 g/dL (ref 6.0–8.5)

## 2017-04-23 LAB — CBC
Hematocrit: 37.5 % (ref 34.0–46.6)
Hemoglobin: 12.4 g/dL (ref 11.1–15.9)
MCH: 27.6 pg (ref 26.6–33.0)
MCHC: 33.1 g/dL (ref 31.5–35.7)
MCV: 83 fL (ref 79–97)
PLATELETS: 224 10*3/uL (ref 150–379)
RBC: 4.5 x10E6/uL (ref 3.77–5.28)
RDW: 13.7 % (ref 12.3–15.4)
WBC: 10.5 10*3/uL (ref 3.4–10.8)

## 2017-04-24 ENCOUNTER — Encounter: Payer: Self-pay | Admitting: Obstetrics and Gynecology

## 2017-04-24 ENCOUNTER — Ambulatory Visit (INDEPENDENT_AMBULATORY_CARE_PROVIDER_SITE_OTHER): Payer: BLUE CROSS/BLUE SHIELD | Admitting: Obstetrics and Gynecology

## 2017-04-24 VITALS — BP 128/74 | HR 99 | Wt 187.0 lb

## 2017-04-24 DIAGNOSIS — Z331 Pregnant state, incidental: Secondary | ICD-10-CM

## 2017-04-24 DIAGNOSIS — Z1389 Encounter for screening for other disorder: Secondary | ICD-10-CM

## 2017-04-24 DIAGNOSIS — Z3402 Encounter for supervision of normal first pregnancy, second trimester: Secondary | ICD-10-CM

## 2017-04-24 LAB — POCT URINALYSIS DIPSTICK
Blood, UA: NEGATIVE
Glucose, UA: NEGATIVE
Ketones, UA: NEGATIVE
NITRITE UA: NEGATIVE
Protein, UA: NEGATIVE

## 2017-04-24 LAB — PROTEIN / CREATININE RATIO, URINE
Creatinine, Urine: 26.1 mg/dL
Protein, Ur: 4.4 mg/dL
Protein/Creat Ratio: 169 mg/g creat (ref 0–200)

## 2017-04-24 NOTE — Progress Notes (Signed)
Patient ID: Cristal DeerCallie M Gillin, female   DOB: 11/16/1995, 21 y.o.   MRN: [id number]  G1P0  [facility]  Chief Complaint  Patient presents with  . Blood Pressure Check  ____  Patient complaints: none.  Patient reports good fetal movement. She denies any bleeding, rupture of membranes,or regular contractions.  Blood pressure 128/74, pulse 99, weight 187 lb (84.8 kg), last menstrual period 10/07/2016.   Urine results:notable for +1 leukocytes  refer to the ob flow sheet for FH and FHR,                           Physical Examination: General appearance - alert, well appearing, and in no distress                                      Abdomen - FHR 175 bpm                                                         soft, nontender, nondistended, no masses or organomegaly  Questions were answered. Assessment: LROB G1P0 @ 6260w2d  Encouraged childbirth classes  Explained call system to pt and partner   Plan:  Continued routine obstetrical care  F/u in 1 week for PN2 and anatomy US, 4w for routine prenatal care   By signing my name below, I, Doreatha MartinEva Mathews, attest that this documentation has been prepared under the direction and in the presence of Tilda BurrowFerguson, Keyli Duross V, MD. Electronically Signed: Doreatha MartinEva Mathews, ED Scribe. 04/24/17. 3:08 PM.  I personally performed the services described in this documentation, which was SCRIBED in my presence. The recorded information has been reviewed and considered accurate. It has been edited as necessary during review. Tilda BurrowFERGUSON,Shakir Petrosino V, MD

## 2017-04-24 NOTE — Patient Instructions (Signed)
(  [phone] is the phone number for Pregnancy Classes or hospital tours at [facility].  ° °You will be referred to  [email or web address]   for more information on childbirth classes   °At this site you may register for classes. You may sign up for a waiting list if classes are full. Please SIGN UP FOR THIS!.   When the waiting list becomes long, sometimes new classes can be added. ° ° ° °

## 2017-05-01 ENCOUNTER — Encounter: Payer: Self-pay | Admitting: Obstetrics & Gynecology

## 2017-05-01 ENCOUNTER — Ambulatory Visit (INDEPENDENT_AMBULATORY_CARE_PROVIDER_SITE_OTHER): Payer: BLUE CROSS/BLUE SHIELD

## 2017-05-01 ENCOUNTER — Other Ambulatory Visit: Payer: BLUE CROSS/BLUE SHIELD

## 2017-05-01 ENCOUNTER — Ambulatory Visit (INDEPENDENT_AMBULATORY_CARE_PROVIDER_SITE_OTHER): Payer: BLUE CROSS/BLUE SHIELD | Admitting: Obstetrics & Gynecology

## 2017-05-01 VITALS — BP 110/70 | HR 80 | Wt 190.0 lb

## 2017-05-01 DIAGNOSIS — Z3403 Encounter for supervision of normal first pregnancy, third trimester: Secondary | ICD-10-CM

## 2017-05-01 DIAGNOSIS — Z3402 Encounter for supervision of normal first pregnancy, second trimester: Secondary | ICD-10-CM

## 2017-05-01 DIAGNOSIS — Z1389 Encounter for screening for other disorder: Secondary | ICD-10-CM

## 2017-05-01 DIAGNOSIS — Z363 Encounter for antenatal screening for malformations: Secondary | ICD-10-CM

## 2017-05-01 DIAGNOSIS — Z3A28 28 weeks gestation of pregnancy: Secondary | ICD-10-CM

## 2017-05-01 DIAGNOSIS — Z331 Pregnant state, incidental: Secondary | ICD-10-CM

## 2017-05-01 DIAGNOSIS — Z131 Encounter for screening for diabetes mellitus: Secondary | ICD-10-CM | POA: Diagnosis not present

## 2017-05-01 LAB — POCT URINALYSIS DIPSTICK
Blood, UA: NEGATIVE
Glucose, UA: NEGATIVE
Ketones, UA: NEGATIVE
LEUKOCYTES UA: NEGATIVE
NITRITE UA: NEGATIVE
PROTEIN UA: NEGATIVE

## 2017-05-01 NOTE — Progress Notes (Signed)
G1P0 6288w2d Estimated Date of Delivery: 07/22/17  Blood pressure 110/70, pulse 80, weight 190 lb (86.2 kg), last menstrual period 10/07/2016.   BP weight and urine results all reviewed and noted.  Please refer to the obstetrical flow sheet for the fundal height and fetal heart rate documentation:  Patient reports good fetal movement, denies any bleeding and no rupture of membranes symptoms or regular contractions. Patient is without complaints. All questions were answered.  Orders Placed This Encounter  Procedures  . POCT urinalysis dipstick    Plan:  Continued routine obstetrical care, sonogram is normal, see report  Return in about 3 weeks (around 05/22/2017) for LROB.

## 2017-05-01 NOTE — Progress Notes (Signed)
US 28+2 wks,breech,cx 3.1 cm,post pl gr 0,normal ovaries bilat,afi 15 cm,efw 1087 g,anatomy complete,no obvious abnormalities seen

## 2017-05-02 LAB — ANTIBODY SCREEN: Antibody Screen: NEGATIVE

## 2017-05-02 LAB — GLUCOSE TOLERANCE, 2 HOURS W/ 1HR
GLUCOSE, 1 HOUR: 188 mg/dL — AB (ref 65–179)
GLUCOSE, 2 HOUR: 149 mg/dL (ref 65–152)
GLUCOSE, FASTING: 83 mg/dL (ref 65–91)

## 2017-05-02 LAB — RPR: RPR: NONREACTIVE

## 2017-05-02 LAB — CBC
HEMOGLOBIN: 11.8 g/dL (ref 11.1–15.9)
Hematocrit: 35.8 % (ref 34.0–46.6)
MCH: 27.6 pg (ref 26.6–33.0)
MCHC: 33 g/dL (ref 31.5–35.7)
MCV: 84 fL (ref 79–97)
PLATELETS: 210 10*3/uL (ref 150–379)
RBC: 4.27 x10E6/uL (ref 3.77–5.28)
RDW: 13.9 % (ref 12.3–15.4)
WBC: 10.4 10*3/uL (ref 3.4–10.8)

## 2017-05-02 LAB — HIV ANTIBODY (ROUTINE TESTING W REFLEX): HIV SCREEN 4TH GENERATION: NONREACTIVE

## 2017-05-12 ENCOUNTER — Inpatient Hospital Stay (HOSPITAL_COMMUNITY)
Admission: AD | Admit: 2017-05-12 | Discharge: 2017-05-12 | Disposition: A | Discharge status: Home / Self Care | Payer: BLUE CROSS/BLUE SHIELD | Source: Ambulatory Visit | Attending: Obstetrics & Gynecology | Admitting: Obstetrics & Gynecology

## 2017-05-12 ENCOUNTER — Telehealth: Payer: Self-pay | Admitting: Obstetrics & Gynecology

## 2017-05-12 ENCOUNTER — Encounter (HOSPITAL_COMMUNITY): Payer: Self-pay

## 2017-05-12 DIAGNOSIS — Z818 Family history of other mental and behavioral disorders: Secondary | ICD-10-CM | POA: Insufficient documentation

## 2017-05-12 DIAGNOSIS — O99613 Diseases of the digestive system complicating pregnancy, third trimester: Secondary | ICD-10-CM | POA: Insufficient documentation

## 2017-05-12 DIAGNOSIS — Z811 Family history of alcohol abuse and dependence: Secondary | ICD-10-CM | POA: Insufficient documentation

## 2017-05-12 DIAGNOSIS — K219 Gastro-esophageal reflux disease without esophagitis: Secondary | ICD-10-CM | POA: Insufficient documentation

## 2017-05-12 DIAGNOSIS — Z3A29 29 weeks gestation of pregnancy: Secondary | ICD-10-CM

## 2017-05-12 DIAGNOSIS — O9989 Other specified diseases and conditions complicating pregnancy, childbirth and the puerperium: Secondary | ICD-10-CM

## 2017-05-12 DIAGNOSIS — Z8249 Family history of ischemic heart disease and other diseases of the circulatory system: Secondary | ICD-10-CM | POA: Insufficient documentation

## 2017-05-12 DIAGNOSIS — Z880 Allergy status to penicillin: Secondary | ICD-10-CM | POA: Diagnosis not present

## 2017-05-12 DIAGNOSIS — R109 Unspecified abdominal pain: Secondary | ICD-10-CM | POA: Diagnosis not present

## 2017-05-12 LAB — URINALYSIS, ROUTINE W REFLEX MICROSCOPIC
Bilirubin Urine: NEGATIVE
Glucose, UA: NEGATIVE mg/dL
Hgb urine dipstick: NEGATIVE
Ketones, ur: NEGATIVE mg/dL
LEUKOCYTES UA: NEGATIVE
Nitrite: NEGATIVE
PROTEIN: NEGATIVE mg/dL
Specific Gravity, Urine: 1.003 — ABNORMAL LOW (ref 1.005–1.030)
pH: 7 (ref 5.0–8.0)

## 2017-05-12 LAB — WET PREP, GENITAL
CLUE CELLS WET PREP: NONE SEEN
Sperm: NONE SEEN
TRICH WET PREP: NONE SEEN
YEAST WET PREP: NONE SEEN

## 2017-05-12 MED ORDER — FAMOTIDINE 20 MG PO TABS
20.0000 mg | ORAL_TABLET | Freq: Once | ORAL | Status: AC
  Administered 2017-05-12: 20 mg via ORAL
  Filled 2017-05-12: qty 1

## 2017-05-12 MED ORDER — PANTOPRAZOLE SODIUM 20 MG PO TBEC: 20.0000 mg | DELAYED_RELEASE_TABLET | Freq: Every day | ORAL | 1 refills | Status: DC

## 2017-05-12 NOTE — Discharge Instructions (Signed)
Third Trimester of Pregnancy The third trimester is from week 28 through week 40 (months 7 through 9). The third trimester is a time when the unborn baby (fetus) is growing rapidly. At the end of the ninth month, the fetus is about 20 inches in length and weighs 6-10 pounds. Body changes during your third trimester Your body will continue to go through many changes during pregnancy. The changes vary from woman to woman. During the third trimester:  Your weight will continue to increase. You can expect to gain 25-35 pounds (11-16 kg) by the end of the pregnancy.  You may begin to get stretch marks on your hips, abdomen, and breasts.  You may urinate more often because the fetus is moving lower into your pelvis and pressing on your bladder.  You may develop or continue to have heartburn. This is caused by increased hormones that slow down muscles in the digestive tract.  You may develop or continue to have constipation because increased hormones slow digestion and cause the muscles that push waste through your intestines to relax.  You may develop hemorrhoids. These are swollen veins (varicose veins) in the rectum that can itch or be painful.  You may develop swollen, bulging veins (varicose veins) in your legs.  You may have increased body aches in the pelvis, back, or thighs. This is due to weight gain and increased hormones that are relaxing your joints.  You may have changes in your hair. These can include thickening of your hair, rapid growth, and changes in texture. Some women also have hair loss during or after pregnancy, or hair that feels dry or thin. Your hair will most likely return to normal after your baby is born.  Your breasts will continue to grow and they will continue to become tender. A yellow fluid (colostrum) may leak from your breasts. This is the first milk you are producing for your baby.  Your belly button may stick out.  You may notice more swelling in your hands,  face, or ankles.  You may have increased tingling or numbness in your hands, arms, and legs. The skin on your belly may also feel numb.  You may feel short of breath because of your expanding uterus.  You may have more problems sleeping. This can be caused by the size of your belly, increased need to urinate, and an increase in your body's metabolism.  You may notice the fetus "dropping," or moving lower in your abdomen (lightening).  You may have increased vaginal discharge.  You may notice your joints feel loose and you may have pain around your pelvic bone.  What to expect at prenatal visits You will have prenatal exams every 2 weeks until week 36. Then you will have weekly prenatal exams. During a routine prenatal visit:  You will be weighed to make sure you and the baby are growing normally.  Your blood pressure will be taken.  Your abdomen will be measured to track your baby's growth.  The fetal heartbeat will be listened to.  Any test results from the previous visit will be discussed.  You may have a cervical check near your due date to see if your cervix has softened or thinned (effaced).  You will be tested for Group B streptococcus. This happens between 35 and 37 weeks.  Your health care provider may ask you:  What your birth plan is.  How you are feeling.  If you are feeling the baby move.  If you have had   any abnormal symptoms, such as leaking fluid, bleeding, severe headaches, or abdominal cramping.  If you are using any tobacco products, including cigarettes, chewing tobacco, and electronic cigarettes.  If you have any questions.  Other tests or screenings that may be performed during your third trimester include:  Blood tests that check for low iron levels (anemia).  Fetal testing to check the health, activity level, and growth of the fetus. Testing is done if you have certain medical conditions or if there are problems during the  pregnancy.  Nonstress test (NST). This test checks the health of your baby to make sure there are no signs of problems, such as the baby not getting enough oxygen. During this test, a belt is placed around your belly. The baby is made to move, and its heart rate is monitored during movement.  What is false labor? False labor is a condition in which you feel small, irregular tightenings of the muscles in the womb (contractions) that usually go away with rest, changing position, or drinking water. These are called Braxton Hicks contractions. Contractions may last for hours, days, or even weeks before true labor sets in. If contractions come at regular intervals, become more frequent, increase in intensity, or become painful, you should see your health care provider. What are the signs of labor?  Abdominal cramps.  Regular contractions that start at 10 minutes apart and become stronger and more frequent with time.  Contractions that start on the top of the uterus and spread down to the lower abdomen and back.  Increased pelvic pressure and dull back pain.  A watery or bloody mucus discharge that comes from the vagina.  Leaking of amniotic fluid. This is also known as your "water breaking." It could be a slow trickle or a gush. Let your health care provider know if it has a color or strange odor. If you have any of these signs, call your health care provider right away, even if it is before your due date. Follow these instructions at home: Medicines  Follow your health care provider's instructions regarding medicine use. Specific medicines may be either safe or unsafe to take during pregnancy.  Take a prenatal vitamin that contains at least 600 micrograms (mcg) of folic acid.  If you develop constipation, try taking a stool softener if your health care provider approves. Eating and drinking  Eat a balanced diet that includes fresh fruits and vegetables, whole grains, good sources of protein  such as meat, eggs, or tofu, and low-fat dairy. Your health care provider will help you determine the amount of weight gain that is right for you.  Avoid raw meat and uncooked cheese. These carry germs that can cause birth defects in the baby.  If you have low calcium intake from food, talk to your health care provider about whether you should take a daily calcium supplement.  Eat four or five small meals rather than three large meals a day.  Limit foods that are high in fat and processed sugars, such as fried and sweet foods.  To prevent constipation: ? Drink enough fluid to keep your urine clear or pale yellow. ? Eat foods that are high in fiber, such as fresh fruits and vegetables, whole grains, and beans. Activity  Exercise only as directed by your health care provider. Most women can continue their usual exercise routine during pregnancy. Try to exercise for 30 minutes at least 5 days a week. Stop exercising if you experience uterine contractions.  Avoid heavy   lifting.  Do not exercise in extreme heat or humidity, or at high altitudes.  Wear low-heel, comfortable shoes.  Practice good posture.  You may continue to have sex unless your health care provider tells you otherwise. Relieving pain and discomfort  Take frequent breaks and rest with your legs elevated if you have leg cramps or low back pain.  Take warm sitz baths to soothe any pain or discomfort caused by hemorrhoids. Use hemorrhoid cream if your health care provider approves.  Wear a good support bra to prevent discomfort from breast tenderness.  If you develop varicose veins: ? Wear support pantyhose or compression stockings as told by your healthcare provider. ? Elevate your feet for 15 minutes, 3-4 times a day. Prenatal care  Write down your questions. Take them to your prenatal visits.  Keep all your prenatal visits as told by your health care provider. This is important. Safety  Wear your seat belt at  all times when driving.  Make a list of emergency phone numbers, including numbers for family, friends, the hospital, and police and fire departments. General instructions  Avoid cat litter boxes and soil used by cats. These carry germs that can cause birth defects in the baby. If you have a cat, ask someone to clean the litter box for you.  Do not travel far distances unless it is absolutely necessary and only with the approval of your health care provider.  Do not use hot tubs, steam rooms, or saunas.  Do not drink alcohol.  Do not use any products that contain nicotine or tobacco, such as cigarettes and e-cigarettes. If you need help quitting, ask your health care provider.  Do not use any medicinal herbs or unprescribed drugs. These chemicals affect the formation and growth of the baby.  Do not douche or use tampons or scented sanitary pads.  Do not cross your legs for long periods of time.  To prepare for the arrival of your baby: ? Take prenatal classes to understand, practice, and ask questions about labor and delivery. ? Make a trial run to the hospital. ? Visit the hospital and tour the maternity area. ? Arrange for maternity or paternity leave through employers. ? Arrange for family and friends to take care of pets while you are in the hospital. ? Purchase a rear-facing car seat and make sure you know how to install it in your car. ? Pack your hospital bag. ? Prepare the baby's nursery. Make sure to remove all pillows and stuffed animals from the baby's crib to prevent suffocation.  Visit your dentist if you have not gone during your pregnancy. Use a soft toothbrush to brush your teeth and be gentle when you floss. Contact a health care provider if:  You are unsure if you are in labor or if your water has broken.  You become dizzy.  You have mild pelvic cramps, pelvic pressure, or nagging pain in your abdominal area.  You have lower back pain.  You have persistent  nausea, vomiting, or diarrhea.  You have an unusual or bad smelling vaginal discharge.  You have pain when you urinate. Get help right away if:  Your water breaks before 37 weeks.  You have regular contractions less than 5 minutes apart before 37 weeks.  You have a fever.  You are leaking fluid from your vagina.  You have spotting or bleeding from your vagina.  You have severe abdominal pain or cramping.  You have rapid weight loss or weight gain.    You have shortness of breath with chest pain.  You notice sudden or extreme swelling of your face, hands, ankles, feet, or legs.  Your baby makes fewer than 10 movements in 2 hours.  You have severe headaches that do not go away when you take medicine.  You have vision changes. Summary  The third trimester is from week 28 through week 40, months 7 through 9. The third trimester is a time when the unborn baby (fetus) is growing rapidly.  During the third trimester, your discomfort may increase as you and your baby continue to gain weight. You may have abdominal, leg, and back pain, sleeping problems, and an increased need to urinate.  During the third trimester your breasts will keep growing and they will continue to become tender. A yellow fluid (colostrum) may leak from your breasts. This is the first milk you are producing for your baby.  False labor is a condition in which you feel small, irregular tightenings of the muscles in the womb (contractions) that eventually go away. These are called Braxton Hicks contractions. Contractions may last for hours, days, or even weeks before true labor sets in.  Signs of labor can include: abdominal cramps; regular contractions that start at 10 minutes apart and become stronger and more frequent with time; watery or bloody mucus discharge that comes from the vagina; increased pelvic pressure and dull back pain; and leaking of amniotic fluid. This information is not intended to replace advice  given to you by your health care provider. Make sure you discuss any questions you have with your health care provider. Document Released: 10/22/2001 Document Revised: 04/04/2016 Document Reviewed: 12/29/2012 Elsevier Interactive Patient Education  2017 [employer]  

## 2017-05-12 NOTE — MAU Note (Signed)
Pt here with abdominal pains, tightening at the top of abdomen and down the sides, some pressure in lower abdomen. Has had loose stools 2 or 3 times over the last 24 hours. Denies any bleeding or leaking of fluid. Reports good fetal movement.

## 2017-05-12 NOTE — MAU Provider Note (Signed)
History     CSN: [id number]  Arrival date and time: 05/12/17 2035   First Provider Initiated Contact with Patient 05/12/17 2107      Chief Complaint  Patient presents with  . Abdominal Pain   Lisa Grimes is a 21 y.o G1P0 at [redacted]w[redacted]d who presents today with abdominal pain. She denies any VB or LOF. She reports fetal movement. She is unsure if she is having contractions. She denies any intercourse in the last 24 hours. Next appointment 05/22/17   Abdominal Pain  This is a new problem. The current episode started today. The onset quality is sudden. The problem occurs intermittently. The problem has been unchanged. The pain is located in the periumbilical region and epigastric region. The pain is at a severity of 6/10. The quality of the pain is sharp and burning. The abdominal pain radiates to the LLQ and RLQ. Associated symptoms include nausea (new for today. She had some nausea in early pregnancy that went away and returned. ). Pertinent negatives include no dysuria, fever or frequency. The pain is aggravated by being still. The pain is relieved by nothing. She has tried nothing for the symptoms.   Past Medical History:  Diagnosis Date  . Abnormal white blood cell count   . Acute bronchitis   . Anxiety   . Chlamydia 2016  . Depression with anxiety   . Dye allergic reaction   . Epistaxis   . Fatigue   . Gastroesophageal reflux disease   . Hypoglycemia   . Influenza A   . Insomnia   . Major depressive disorder, recurrent episode, moderate with anxious distress (HCC)   . Menstrual headache   . Migraine headache   . Mild major depression (HCC)   . Palpitations   . Panic disorder with agoraphobia and severe panic attacks   . Parent-child relational problem   . Rhinitis, allergic   . Screening for depression   . UTI (lower urinary tract infection)   . Viral syndrome     Past Surgical History:  Procedure Laterality Date  . EYE SURGERY    . MOUTH SURGERY  2010  . STRABISMUS  SURGERY  1997  . TONSILLECTOMY  2003  . tubes in ear  2000    Family History  Problem Relation Age of Onset  . Depression Mother   . Anxiety disorder Mother   . Hypertension Mother   . Alcohol abuse Father   . Hypertension Father   . Bipolar disorder Maternal Aunt   . Cancer Maternal Aunt        breast  . Hypertension Maternal Aunt   . Diabetes Maternal Aunt   . Bipolar disorder Maternal Uncle   . Hypertension Maternal Uncle   . Bipolar disorder Paternal Uncle   . Depression Maternal Grandmother   . Stroke Maternal Grandmother   . Hypertension Maternal Grandmother   . Cancer Maternal Grandmother        breast  . Heart disease Maternal Grandmother   . Depression Paternal Grandmother   . Bipolar disorder Paternal Grandmother   . ADD / ADHD Cousin   . Depression Cousin   . Heart attack Maternal Aunt   . Heart disease Maternal Aunt   . Hypertension Maternal Aunt   . Heart attack Maternal Uncle   . Heart disease Maternal Uncle   . Diabetes Maternal Uncle   . Stroke Maternal Grandfather   . Hypertension Maternal Grandfather   . Heart disease Maternal Grandfather  Social History  Substance Use Topics  . Smoking status: Never Smoker  . Smokeless tobacco: Never Used  . Alcohol use No    Allergies:  Allergies  Allergen Reactions  . Amoxicillin Hives    Has patient had a PCN reaction causing immediate rash, facial/tongue/throat swelling, SOB or lightheadedness with hypotension: Yes Has patient had a PCN reaction causing severe rash involving mucus membranes or skin necrosis: No Has patient had a PCN reaction that required hospitalization No Has patient had a PCN reaction occurring within the last 10 years: No If all of the above answers are "NO", then may proceed with Cephalosporin use.   . Latex   . Penicillins Hives    Has patient had a PCN reaction causing immediate rash, facial/tongue/throat swelling, SOB or lightheadedness with hypotension: Yes Has patient  had a PCN reaction causing severe rash involving mucus membranes or skin necrosis: No Has patient had a PCN reaction that required hospitalization No Has patient had a PCN reaction occurring within the last 10 years: no If all of the above answers are "NO", then may proceed with Cephalosporin use.   . Red Dye     possible allergy    Prescriptions Prior to Admission  Medication Sig Dispense Refill Last Dose  . Prenatal Vit-Fe Fumarate-FA (MULTIVITAMIN-PRENATAL) 27-0.8 MG TABS tablet Take 1 tablet by mouth daily at 12 noon.   Taking    Review of Systems  Constitutional: Negative for chills and fever.  Gastrointestinal: Positive for abdominal pain and nausea (new for today. She had some nausea in early pregnancy that went away and returned. ).  Genitourinary: Negative for dysuria, frequency, urgency, vaginal bleeding and vaginal discharge.   Physical Exam   Blood pressure 133/83, pulse (!) 114, temperature 98.6 F (37 C), temperature source Oral, resp. rate 18, height 5\' 4"  (1.626 m), weight 190 lb (86.2 kg), last menstrual period 10/07/2016, SpO2 99 %.  Physical Exam  Nursing note and vitals reviewed. Constitutional: She is oriented to person, place, and time. She appears well-developed and well-nourished. No distress.  HENT:  Head: Normocephalic.  Cardiovascular: Normal rate.   Respiratory: Effort normal.  GI: Soft. There is no tenderness. There is no rebound.  Genitourinary:  Genitourinary Comments: Cervix: closed/thick/ballotable   Neurological: She is alert and oriented to person, place, and time.  Skin: Skin is warm and dry.  Psychiatric: She has a normal mood and affect.  FHT: 145, moderate with 15x15 accels, no decels  Toco: no UCs, some random UI    Results for orders placed or performed during the hospital encounter of 05/12/17 (from the past 24 hour(s))  Urinalysis, Routine w reflex microscopic     Status: Abnormal   Collection Time: 05/12/17  8:45 PM  Result Value  Ref Range   Color, Urine STRAW (A) YELLOW   APPearance CLEAR CLEAR   Specific Gravity, Urine 1.003 (L) 1.005 - 1.030   pH 7.0 5.0 - 8.0   Glucose, UA NEGATIVE NEGATIVE mg/dL   Hgb urine dipstick NEGATIVE NEGATIVE   Bilirubin Urine NEGATIVE NEGATIVE   Ketones, ur NEGATIVE NEGATIVE mg/dL   Protein, ur NEGATIVE NEGATIVE mg/dL   Nitrite NEGATIVE NEGATIVE   Leukocytes, UA NEGATIVE NEGATIVE  Wet prep, genital     Status: Abnormal   Collection Time: 05/12/17  9:15 PM  Result Value Ref Range   Yeast Wet Prep HPF POC NONE SEEN NONE SEEN   Trich, Wet Prep NONE SEEN NONE SEEN   Clue Cells Wet Prep HPF  POC NONE SEEN NONE SEEN   WBC, Wet Prep HPF POC FEW (A) NONE SEEN   Sperm NONE SEEN     MAU Course  Procedures  MDM Patient has had pepcid, and pain has improved   Assessment and Plan   1. Gastroesophageal reflux disease, esophagitis presence not specified   2. [redacted] weeks gestation of pregnancy    DC home Comfort measures reviewed  3rd Trimester precautions  PTL precautions  Fetal kick counts RX: protonix q day #30  Return to MAU as needed FU with OB as planned  Follow-up Information    [facility] Follow up.   Specialty:  Obstetrics and Gynecology Contact information: [address] [place] [address] [phone]           Thressa Sheller 05/12/2017, 9:09 PM

## 2017-05-12 NOTE — Telephone Encounter (Signed)
Pt called stating that she had been having what she thinks are contractions on and off this morning. I explained to patient what braxton hicks contractions were and pt stated that she thought that that's what they felt like. I advised pt to try and drink plenty of fluids. Pt states that baby is moving fine. I advised pt that if contractions become 5 min apart that she could call us back [facility]. Pt verbalized understanding.

## 2017-05-13 LAB — GC/CHLAMYDIA PROBE AMP (~~LOC~~) NOT AT ARMC
Chlamydia: NEGATIVE
NEISSERIA GONORRHEA: NEGATIVE

## 2017-05-22 ENCOUNTER — Telehealth: Payer: Self-pay | Admitting: *Deleted

## 2017-05-22 ENCOUNTER — Encounter: Payer: Self-pay | Admitting: Obstetrics & Gynecology

## 2017-05-22 ENCOUNTER — Other Ambulatory Visit: Payer: Self-pay | Admitting: *Deleted

## 2017-05-22 ENCOUNTER — Ambulatory Visit (INDEPENDENT_AMBULATORY_CARE_PROVIDER_SITE_OTHER): Payer: BLUE CROSS/BLUE SHIELD | Admitting: Obstetrics & Gynecology

## 2017-05-22 VITALS — BP 120/82 | HR 85 | Wt 191.0 lb

## 2017-05-22 DIAGNOSIS — R81 Glycosuria: Secondary | ICD-10-CM

## 2017-05-22 DIAGNOSIS — O24410 Gestational diabetes mellitus in pregnancy, diet controlled: Secondary | ICD-10-CM

## 2017-05-22 DIAGNOSIS — Z1389 Encounter for screening for other disorder: Secondary | ICD-10-CM

## 2017-05-22 DIAGNOSIS — Z3A31 31 weeks gestation of pregnancy: Secondary | ICD-10-CM

## 2017-05-22 DIAGNOSIS — O09893 Supervision of other high risk pregnancies, third trimester: Secondary | ICD-10-CM

## 2017-05-22 DIAGNOSIS — O0993 Supervision of high risk pregnancy, unspecified, third trimester: Secondary | ICD-10-CM

## 2017-05-22 DIAGNOSIS — Z331 Pregnant state, incidental: Secondary | ICD-10-CM

## 2017-05-22 LAB — POCT URINALYSIS DIPSTICK
Ketones, UA: NEGATIVE
LEUKOCYTES UA: NEGATIVE
NITRITE UA: NEGATIVE
Protein, UA: NEGATIVE
RBC UA: NEGATIVE

## 2017-05-22 LAB — GLUCOSE, POCT (MANUAL RESULT ENTRY): POC GLUCOSE: 159 mg/dL — AB (ref 70–99)

## 2017-05-22 NOTE — Telephone Encounter (Signed)
Informed patient diabetic referral was sent and advised to call me back if she has not heard from them by the end of next week. Verbalized understanding.

## 2017-05-22 NOTE — Progress Notes (Signed)
Fetal Surveillance Testing today:  FHR 150   High Risk Pregnancy Diagnosis(es):   Class A1 DM  G1P0 1932w2d Estimated Date of Delivery: 07/22/17  Blood pressure 120/82, pulse 85, weight 191 lb (86.6 kg), last menstrual period 10/07/2016.  Urinalysis: Negative   HPI: The patient is being seen today for ongoing management of as above. Today she reports pt is aware and is scheduled for diabetic educator visit today   BP weight and urine results all reviewed and noted. Patient reports good fetal movement, denies any bleeding and no rupture of membranes symptoms or regular contractions.  Fundal Height:  32 Fetal Heart rate:  155 Edema:  none  Patient is without complaints other than noted in her HPI. All questions were answered.  All lab and sonogram results have been reviewed. Comments:    Assessment:  1.  Pregnancy at 7832w2d,  Estimated Date of Delivery: 07/22/17 :                          2.  Class A1 DM                        3.    Medication(s) Plans:  none  Treatment Plan:  Will schedule visit for nutritional consult, EFW 36-37 weeks  Return in about 2 weeks (around 06/05/2017) for HROB, with Dr Despina HiddenEure. for appointment for high risk OB care  No orders of the defined types were placed in this encounter.  Orders Placed This Encounter  Procedures  . POCT urinalysis dipstick  . POCT glucose (manual entry)

## 2017-06-04 ENCOUNTER — Encounter: Payer: BLUE CROSS/BLUE SHIELD | Attending: Family Medicine | Admitting: Registered"

## 2017-06-04 DIAGNOSIS — O24410 Gestational diabetes mellitus in pregnancy, diet controlled: Secondary | ICD-10-CM | POA: Insufficient documentation

## 2017-06-04 DIAGNOSIS — R7309 Other abnormal glucose: Secondary | ICD-10-CM

## 2017-06-05 ENCOUNTER — Ambulatory Visit (INDEPENDENT_AMBULATORY_CARE_PROVIDER_SITE_OTHER): Payer: BLUE CROSS/BLUE SHIELD | Admitting: Advanced Practice Midwife

## 2017-06-05 ENCOUNTER — Encounter: Payer: BLUE CROSS/BLUE SHIELD | Admitting: Women's Health

## 2017-06-05 ENCOUNTER — Encounter: Payer: Self-pay | Admitting: Advanced Practice Midwife

## 2017-06-05 VITALS — BP 122/84 | HR 84 | Wt 192.0 lb

## 2017-06-05 DIAGNOSIS — O24410 Gestational diabetes mellitus in pregnancy, diet controlled: Secondary | ICD-10-CM

## 2017-06-05 DIAGNOSIS — Z331 Pregnant state, incidental: Secondary | ICD-10-CM

## 2017-06-05 DIAGNOSIS — O139 Gestational [pregnancy-induced] hypertension without significant proteinuria, unspecified trimester: Secondary | ICD-10-CM

## 2017-06-05 DIAGNOSIS — O0993 Supervision of high risk pregnancy, unspecified, third trimester: Secondary | ICD-10-CM

## 2017-06-05 DIAGNOSIS — Z3A32 32 weeks gestation of pregnancy: Secondary | ICD-10-CM

## 2017-06-05 DIAGNOSIS — O09893 Supervision of other high risk pregnancies, third trimester: Secondary | ICD-10-CM

## 2017-06-05 DIAGNOSIS — O133 Gestational [pregnancy-induced] hypertension without significant proteinuria, third trimester: Secondary | ICD-10-CM | POA: Diagnosis not present

## 2017-06-05 DIAGNOSIS — Z1389 Encounter for screening for other disorder: Secondary | ICD-10-CM

## 2017-06-05 HISTORY — DX: Gestational (pregnancy-induced) hypertension without significant proteinuria, unspecified trimester: O13.9

## 2017-06-05 LAB — POCT URINALYSIS DIPSTICK
GLUCOSE UA: NEGATIVE
LEUKOCYTES UA: NEGATIVE
NITRITE UA: NEGATIVE
Protein, UA: NEGATIVE
RBC UA: NEGATIVE

## 2017-06-05 NOTE — Progress Notes (Signed)
G1P0 687w2d Estimated Date of Delivery: 07/22/17  Blood pressure 122/84, pulse 84, weight 192 lb (87.1 kg), last menstrual period 10/07/2016.   BP weight and urine results all reviewed and noted. She has had elevated BPs at 27 weeks, so today she gets dx of GHTN.  Will do baseline labs.  Saw dietician yesterday.  Yeast rash in groin.  Use OTC monistat  FBS 72 and 2 hr pp 105  Please refer to the obstetrical flow sheet for the fundal height and fetal heart rate documentation:  Patient reports good fetal movement, denies any bleeding and no rupture of membranes symptoms or regular contractions. Patient is without complaints other than whats  All questions were answered.  Orders Placed This Encounter  Procedures  . Comprehensive metabolic panel  . CBC  . POCT urinalysis dipstick    Plan:   Return in about 4 days (around 06/09/2017) for Start Monday NST/HROB and Thursday US/HROB.

## 2017-06-05 NOTE — Patient Instructions (Signed)
Hypertension During Pregnancy °Hypertension, commonly called high blood pressure, is when the force of blood pumping through your arteries is too strong. Arteries are blood vessels that carry blood from the heart throughout the body. Hypertension during pregnancy can cause problems for you and your baby. Your baby may be born early (prematurely) or may not weigh as much as he or she should at birth. Very bad cases of hypertension during pregnancy can be life-threatening. °Different types of hypertension can occur during pregnancy. These include: °· Chronic hypertension. This happens when: °? You have hypertension before pregnancy and it continues during pregnancy. °? You develop hypertension before you are [redacted] weeks pregnant, and it continues during pregnancy. °· Gestational hypertension. This is hypertension that develops after the 20th week of pregnancy. °· Preeclampsia, also called toxemia of pregnancy. This is a very serious type of hypertension that develops only during pregnancy. It affects the whole body, and it can be very dangerous for you and your baby. ° °Gestational hypertension and preeclampsia usually go away within 6 weeks after your baby is born. Women who have hypertension during pregnancy have a greater chance of developing hypertension later in life or during future pregnancies. °What are the causes? °The exact cause of hypertension is not known. °What increases the risk? °There are certain factors that make it more likely for you to develop hypertension during pregnancy. These include: °· Having hypertension during a previous pregnancy or prior to pregnancy. °· Being overweight. °· Being older than age 40. °· Being pregnant for the first time or being pregnant with more than one baby. °· Becoming pregnant using fertilization methods such as IVF (in vitro fertilization). °· Having diabetes, kidney problems, or systemic lupus erythematosus. °· Having a family history of hypertension. ° °What are the  signs or symptoms? °Chronic hypertension and gestational hypertension rarely cause symptoms. Preeclampsia causes symptoms, which may include: °· Increased protein in your urine. Your health care provider will check for this at every visit before you give birth (prenatal visit). °· Severe headaches. °· Sudden weight gain. °· Swelling of the hands, face, legs, and feet. °· Nausea and vomiting. °· Vision problems, such as blurred or double vision. °· Numbness in the face, arms, legs, and feet. °· Dizziness. °· Slurred speech. °· Sensitivity to bright lights. °· Abdominal pain. °· Convulsions. ° °How is this diagnosed? °You may be diagnosed with hypertension during a routine prenatal exam. At each prenatal visit, you may: °· Have a urine test to check for high amounts of protein in your urine. °· Have your blood pressure checked. A blood pressure reading is recorded as two numbers, such as "120 over 80" (or 120/80). The first ("top") number is called the systolic pressure. It is a measure of the pressure in your arteries when your heart beats. The second ("bottom") number is called the diastolic pressure. It is a measure of the pressure in your arteries as your heart relaxes between beats. Blood pressure is measured in a unit called mm Hg. A normal blood pressure reading is: °? Systolic: below 120. °? Diastolic: below 80. ° °The type of hypertension that you are diagnosed with depends on your test results and when your symptoms developed. °· Chronic hypertension is usually diagnosed before 20 weeks of pregnancy. °· Gestational hypertension is usually diagnosed after 20 weeks of pregnancy. °· Hypertension with high amounts of protein in the urine is diagnosed as preeclampsia. °· Blood pressure measurements that stay above 160 systolic, or above 110 diastolic, are   signs of severe preeclampsia. ° °How is this treated? °Treatment for hypertension during pregnancy varies depending on the type of hypertension you have and how  serious it is. °· If you take medicines called ACE inhibitors to treat chronic hypertension, you may need to switch medicines. ACE inhibitors should not be taken during pregnancy. °· If you have gestational hypertension, you may need to take blood pressure medicine. °· If you are at risk for preeclampsia, your health care provider may recommend that you take a low-dose aspirin every day to prevent high blood pressure during your pregnancy. °· If you have severe preeclampsia, you may need to be hospitalized so you and your baby can be monitored closely. You may also need to take medicine (magnesium sulfate) to prevent seizures and to lower blood pressure. This medicine may be given as an injection or through an IV tube. °· In some cases, if your condition gets worse, you may need to deliver your baby early. ° °Follow these instructions at home: °Eating and drinking °· Drink enough fluid to keep your urine clear or pale yellow. °· Eat a healthy diet that is low in salt (sodium). Do not add salt to your food. Check food labels to see how much sodium a food or beverage contains. °Lifestyle °· Do not use any products that contain nicotine or tobacco, such as cigarettes and e-cigarettes. If you need help quitting, ask your health care provider. °· Do not use alcohol. °· Avoid caffeine. °· Avoid stress as much as possible. Rest and get plenty of sleep. °General instructions °· Take over-the-counter and prescription medicines only as told by your health care provider. °· While lying down, lie on your left side. This keeps pressure off your baby. °· While sitting or lying down, raise (elevate) your feet. Try putting some pillows under your lower legs. °· Exercise regularly. Ask your health care provider what kinds of exercise are best for you. °· Keep all prenatal and follow-up visits as told by your health care provider. This is important. °Contact a health care provider if: °· You have symptoms that your health care  provider told you may require more treatment or monitoring, such as: °? Fever. °? Vomiting. °? Headache. °Get help right away if: °· You have severe abdominal pain or vomiting that does not get better with treatment. °· You suddenly develop swelling in your hands, ankles, or face. °· You gain 4 lbs (1.8 kg) or more in 1 week. °· You develop vaginal bleeding, or you have blood in your urine. °· You do not feel your baby moving as much as usual. °· You have blurred or double vision. °· You have muscle twitching or sudden tightening (spasms). °· You have shortness of breath. °· Your lips or fingernails turn blue. °This information is not intended to replace advice given to you by your health care provider. Make sure you discuss any questions you have with your health care provider. °Document Released: 07/16/2011 Document Revised: 05/17/2016 Document Reviewed: 04/12/2016 °Elsevier Interactive Patient Education © 2018 [employer] ° °

## 2017-06-06 LAB — COMPREHENSIVE METABOLIC PANEL
ALK PHOS: 111 IU/L (ref 39–117)
ALT: 27 IU/L (ref 0–32)
AST: 27 IU/L (ref 0–40)
Albumin/Globulin Ratio: 1.1 — ABNORMAL LOW (ref 1.2–2.2)
Albumin: 3.4 g/dL — ABNORMAL LOW (ref 3.5–5.5)
BILIRUBIN TOTAL: 0.4 mg/dL (ref 0.0–1.2)
BUN/Creatinine Ratio: 13 (ref 9–23)
BUN: 7 mg/dL (ref 6–20)
CO2: 19 mmol/L — AB (ref 20–29)
Calcium: 9.4 mg/dL (ref 8.7–10.2)
Chloride: 102 mmol/L (ref 96–106)
Creatinine, Ser: 0.54 mg/dL — ABNORMAL LOW (ref 0.57–1.00)
GFR calc non Af Amer: 134 mL/min/{1.73_m2} (ref >59)
GFR, EST AFRICAN AMERICAN: 155 mL/min/{1.73_m2} (ref >59)
GLUCOSE: 99 mg/dL (ref 65–99)
Globulin, Total: 3.2 g/dL (ref 1.5–4.5)
POTASSIUM: 4 mmol/L (ref 3.5–5.2)
Sodium: 138 mmol/L (ref 134–144)
Total Protein: 6.6 g/dL (ref 6.0–8.5)

## 2017-06-06 LAB — CBC
HEMATOCRIT: 36.2 % (ref 34.0–46.6)
HEMOGLOBIN: 12.1 g/dL (ref 11.1–15.9)
MCH: 27.1 pg (ref 26.6–33.0)
MCHC: 33.4 g/dL (ref 31.5–35.7)
MCV: 81 fL (ref 79–97)
Platelets: 182 10*3/uL (ref 150–379)
RBC: 4.46 x10E6/uL (ref 3.77–5.28)
RDW: 13.8 % (ref 12.3–15.4)
WBC: 7.7 10*3/uL (ref 3.4–10.8)

## 2017-06-09 ENCOUNTER — Encounter: Payer: Self-pay | Admitting: Women's Health

## 2017-06-09 ENCOUNTER — Encounter: Payer: Self-pay | Admitting: Registered"

## 2017-06-09 ENCOUNTER — Ambulatory Visit (INDEPENDENT_AMBULATORY_CARE_PROVIDER_SITE_OTHER): Payer: BLUE CROSS/BLUE SHIELD | Admitting: Women's Health

## 2017-06-09 VITALS — BP 118/70 | HR 89 | Wt 192.5 lb

## 2017-06-09 DIAGNOSIS — O24410 Gestational diabetes mellitus in pregnancy, diet controlled: Secondary | ICD-10-CM | POA: Diagnosis not present

## 2017-06-09 DIAGNOSIS — O139 Gestational [pregnancy-induced] hypertension without significant proteinuria, unspecified trimester: Secondary | ICD-10-CM

## 2017-06-09 DIAGNOSIS — O0990 Supervision of high risk pregnancy, unspecified, unspecified trimester: Secondary | ICD-10-CM

## 2017-06-09 DIAGNOSIS — O0993 Supervision of high risk pregnancy, unspecified, third trimester: Secondary | ICD-10-CM

## 2017-06-09 DIAGNOSIS — Z331 Pregnant state, incidental: Secondary | ICD-10-CM

## 2017-06-09 DIAGNOSIS — Z3A33 33 weeks gestation of pregnancy: Secondary | ICD-10-CM

## 2017-06-09 DIAGNOSIS — O133 Gestational [pregnancy-induced] hypertension without significant proteinuria, third trimester: Secondary | ICD-10-CM

## 2017-06-09 DIAGNOSIS — O09893 Supervision of other high risk pregnancies, third trimester: Secondary | ICD-10-CM

## 2017-06-09 DIAGNOSIS — Z1389 Encounter for screening for other disorder: Secondary | ICD-10-CM

## 2017-06-09 LAB — POCT URINALYSIS DIPSTICK
Glucose, UA: NEGATIVE
NITRITE UA: NEGATIVE
PROTEIN UA: NEGATIVE

## 2017-06-09 NOTE — Progress Notes (Signed)
High Risk Pregnancy Diagnosis(es): GHTN-labile bp's, A1DM G1P0 2743w6d Estimated Date of Delivery: 07/22/17 BP 118/70   Pulse 89   Wt 192 lb 8 oz (87.3 kg)   LMP 10/07/2016   BMI 33.04 kg/m   Urinalysis: Positive for 4+ ketones, tr leuks HPI:  Doing well, all sugars normal except for one 2hr pp 131, made 'protein pancakes' w/ milk instead of water. Denies ha, visual changes, ruq/epigastric pain, n/v.   BP, weight, and urine reviewed.  Reports good fm. Denies regular uc's, lof, vb, uti s/s. No complaints.  Fundal Height:  33 Fetal Heart rate:  150, reactive NST Edema:  none  Reviewed ptl s/s, pre-e s/s, fkc All questions were answered Assessment: 5743w6d GHTN- labile bp's, A1DM Medication(s) Plans:  none Treatment Plan:  2x/wk testing, nst alt w/ bpp/dopp, IOL @ 37-39wks depending on bp's Follow up in 3d for high-risk OB appt and efw/afi/bpp/dopp u/s

## 2017-06-09 NOTE — Patient Instructions (Signed)
Call the office ([phone]) or go to [facility] if:  You begin to have strong, frequent contractions  Your water breaks.  Sometimes it is a big gush of fluid, sometimes it is just a trickle that keeps getting your panties wet or running down your legs  You have vaginal bleeding.  It is normal to have a small amount of spotting if your cervix was checked.   You don't feel your baby moving like normal.  If you don't, get you something to eat and drink and lay down and focus on feeling your baby move.  You should feel at least 10 movements in 2 hours.  If you don't, you should call the office or go to [facility].   Call the office ([phone]) or go to [facility] for these signs of pre-eclampsia:  Severe headache that does not go away with Tylenol  Visual changes- seeing spots, double, blurred vision  Pain under your right breast or upper abdomen that does not go away with Tums or heartburn medicine  Nausea and/or vomiting  Severe swelling in your hands, feet, and face        Preterm Labor and Birth Information The normal length of a pregnancy is 39-41 weeks. Preterm labor is when labor starts before 37 completed weeks of pregnancy. What are the risk factors for preterm labor? Preterm labor is more likely to occur in women who:  Have certain infections during pregnancy such as a bladder infection, sexually transmitted infection, or infection inside the uterus (chorioamnionitis).  Have a shorter-than-normal cervix.  Have gone into preterm labor before.  Have had surgery on their cervix.  Are younger than age 17 or older than age 35.  Are African American.  Are pregnant with twins or multiple babies (multiple gestation).  Take street drugs or smoke while pregnant.  Do not gain enough weight while pregnant.  Became pregnant shortly after having been pregnant.  What are the symptoms of preterm labor? Symptoms of preterm labor include:  Cramps similar to  those that can happen during a menstrual period. The cramps may happen with diarrhea.  Pain in the abdomen or lower back.  Regular uterine contractions that may feel like tightening of the abdomen.  A feeling of increased pressure in the pelvis.  Increased watery or bloody mucus discharge from the vagina.  Water breaking (ruptured amniotic sac).  Why is it important to recognize signs of preterm labor? It is important to recognize signs of preterm labor because babies who are born prematurely may not be fully developed. This can put them at an increased risk for:  Long-term (chronic) heart and lung problems.  Difficulty immediately after birth with regulating body systems, including blood sugar, body temperature, heart rate, and breathing rate.  Bleeding in the brain.  Cerebral palsy.  Learning difficulties.  Death.  These risks are highest for babies who are born before 34 weeks of pregnancy. How is preterm labor treated? Treatment depends on the length of your pregnancy, your condition, and the health of your baby. It may involve:  Having a stitch (suture) placed in your cervix to prevent your cervix from opening too early (cerclage).  Taking or being given medicines, such as: ? Hormone medicines. These may be given early in pregnancy to help support the pregnancy. ? Medicine to stop contractions. ? Medicines to help mature the baby's lungs. These may be prescribed if the risk of delivery is high. ? Medicines to prevent your baby from developing cerebral palsy.    If the labor happens before 34 weeks of pregnancy, you may need to stay in the hospital. What should I do if I think I am in preterm labor? If you think that you are going into preterm labor, call your health care provider right away. How can I prevent preterm labor in future pregnancies? To increase your chance of having a full-term pregnancy:  Do not use any tobacco products, such as cigarettes, chewing  tobacco, and e-cigarettes. If you need help quitting, ask your health care provider.  Do not use street drugs or medicines that have not been prescribed to you during your pregnancy.  Talk with your health care provider before taking any herbal supplements, even if you have been taking them regularly.  Make sure you gain a healthy amount of weight during your pregnancy.  Watch for infection. If you think that you might have an infection, get it checked right away.  Make sure to tell your health care provider if you have gone into preterm labor before.  This information is not intended to replace advice given to you by your health care provider. Make sure you discuss any questions you have with your health care provider. Document Released: 01/18/2004 Document Revised: 04/09/2016 Document Reviewed: 03/20/2016 Elsevier Interactive Patient Education  2018 [employer] Inc[employer]  

## 2017-06-09 NOTE — Progress Notes (Signed)
Patient was seen on 06/04/2017 for Gestational Diabetes self-management class at the [facility]. The following learning objectives were met by the patient during this course:   States the definition of Gestational Diabetes  States why dietary management is important in controlling blood glucose  Describes the effects each nutrient has on blood glucose levels  Demonstrates ability to create a balanced meal plan  Demonstrates carbohydrate counting   States when to check blood glucose levels  Demonstrates proper blood glucose monitoring techniques  States the effect of stress and exercise on blood glucose levels  States the importance of limiting caffeine and abstaining from alcohol and smoking  Blood glucose monitor given: Accu Chek Guide Lot # [id number] Exp: 12/27/17 Blood glucose reading: 89  Patient instructed to monitor glucose levels: FBS: 60 - <95 1 hour: <140 2 hour: <120  Patient received handouts:  Nutrition Diabetes and Pregnancy  Carbohydrate Counting List  Patient will be seen for follow-up as needed.

## 2017-06-12 ENCOUNTER — Other Ambulatory Visit: Payer: BLUE CROSS/BLUE SHIELD | Admitting: Obstetrics and Gynecology

## 2017-06-12 ENCOUNTER — Ambulatory Visit (INDEPENDENT_AMBULATORY_CARE_PROVIDER_SITE_OTHER): Payer: BLUE CROSS/BLUE SHIELD | Admitting: Women's Health

## 2017-06-12 ENCOUNTER — Ambulatory Visit (INDEPENDENT_AMBULATORY_CARE_PROVIDER_SITE_OTHER): Payer: BLUE CROSS/BLUE SHIELD

## 2017-06-12 ENCOUNTER — Encounter: Payer: Self-pay | Admitting: Women's Health

## 2017-06-12 VITALS — BP 130/80 | HR 81 | Wt 191.5 lb

## 2017-06-12 DIAGNOSIS — O133 Gestational [pregnancy-induced] hypertension without significant proteinuria, third trimester: Secondary | ICD-10-CM

## 2017-06-12 DIAGNOSIS — O0993 Supervision of high risk pregnancy, unspecified, third trimester: Secondary | ICD-10-CM

## 2017-06-12 DIAGNOSIS — O24410 Gestational diabetes mellitus in pregnancy, diet controlled: Secondary | ICD-10-CM

## 2017-06-12 DIAGNOSIS — Z331 Pregnant state, incidental: Secondary | ICD-10-CM

## 2017-06-12 DIAGNOSIS — Z3A34 34 weeks gestation of pregnancy: Secondary | ICD-10-CM | POA: Diagnosis not present

## 2017-06-12 DIAGNOSIS — O09893 Supervision of other high risk pregnancies, third trimester: Secondary | ICD-10-CM

## 2017-06-12 DIAGNOSIS — O139 Gestational [pregnancy-induced] hypertension without significant proteinuria, unspecified trimester: Secondary | ICD-10-CM

## 2017-06-12 DIAGNOSIS — Z1389 Encounter for screening for other disorder: Secondary | ICD-10-CM

## 2017-06-12 DIAGNOSIS — O0990 Supervision of high risk pregnancy, unspecified, unspecified trimester: Secondary | ICD-10-CM

## 2017-06-12 LAB — POCT URINALYSIS DIPSTICK
Glucose, UA: NEGATIVE
Leukocytes, UA: NEGATIVE
NITRITE UA: NEGATIVE
PROTEIN UA: NEGATIVE

## 2017-06-12 NOTE — Progress Notes (Signed)
US 34+2 wks,cephalic,fhr 150 bpm,post pl gr 2,afi 14.6 cm,RI .53,.51=24% S/D 2.16=24%,EFW 2352 g 41%,BPP 8/8

## 2017-06-12 NOTE — Progress Notes (Signed)
High Risk Pregnancy Diagnosis(es): GHTN-labile bp's, A1DM G1P0 5073w2d Estimated Date of Delivery: 07/22/17 BP 130/80   Pulse 81   Wt 191 lb 8 oz (86.9 kg)   LMP 10/07/2016   BMI 32.87 kg/m   Urinalysis: Positive for 1+ ketones, tr blood HPI:  All sugars wnl except one 2hr pp of 124- thinks she tested a little too early.  BP, weight, and urine reviewed.  Reports good fm. Denies regular uc's, lof, vb, uti s/s. No complaints.  Fundal Height:  34 Fetal Heart rate:  150 u/s Edema:  trace  Reviewed today's u/s: bpp 8/8, dopp 24%, efw 41%, afi 14.6cm, vtx Discussed ptl s/s, pre-e s/s, fkc All questions were answered Assessment: 4773w2d GHTN-labile bp's, A1DM Medication(s) Plans:  none Treatment Plan:  2x/wk testing, nst alt w/ bpp/dopp, IOL @ 37-39wks depending on bp Follow up Monday as scheduled for high-risk OB appt and NST

## 2017-06-12 NOTE — Patient Instructions (Signed)
Call the office ([phone]) or go to [facility] if:  You begin to have strong, frequent contractions  Your water breaks.  Sometimes it is a big gush of fluid, sometimes it is just a trickle that keeps getting your panties wet or running down your legs  You have vaginal bleeding.  It is normal to have a small amount of spotting if your cervix was checked.   You don't feel your baby moving like normal.  If you don't, get you something to eat and drink and lay down and focus on feeling your baby move.  You should feel at least 10 movements in 2 hours.  If you don't, you should call the office or go to [facility].   Call the office ([phone]) or go to [facility] for these signs of pre-eclampsia:  Severe headache that does not go away with Tylenol  Visual changes- seeing spots, double, blurred vision  Pain under your right breast or upper abdomen that does not go away with Tums or heartburn medicine  Nausea and/or vomiting  Severe swelling in your hands, feet, and face        Preterm Labor and Birth Information The normal length of a pregnancy is 39-41 weeks. Preterm labor is when labor starts before 37 completed weeks of pregnancy. What are the risk factors for preterm labor? Preterm labor is more likely to occur in women who:  Have certain infections during pregnancy such as a bladder infection, sexually transmitted infection, or infection inside the uterus (chorioamnionitis).  Have a shorter-than-normal cervix.  Have gone into preterm labor before.  Have had surgery on their cervix.  Are younger than age 17 or older than age 35.  Are African American.  Are pregnant with twins or multiple babies (multiple gestation).  Take street drugs or smoke while pregnant.  Do not gain enough weight while pregnant.  Became pregnant shortly after having been pregnant.  What are the symptoms of preterm labor? Symptoms of preterm labor include:  Cramps similar to  those that can happen during a menstrual period. The cramps may happen with diarrhea.  Pain in the abdomen or lower back.  Regular uterine contractions that may feel like tightening of the abdomen.  A feeling of increased pressure in the pelvis.  Increased watery or bloody mucus discharge from the vagina.  Water breaking (ruptured amniotic sac).  Why is it important to recognize signs of preterm labor? It is important to recognize signs of preterm labor because babies who are born prematurely may not be fully developed. This can put them at an increased risk for:  Long-term (chronic) heart and lung problems.  Difficulty immediately after birth with regulating body systems, including blood sugar, body temperature, heart rate, and breathing rate.  Bleeding in the brain.  Cerebral palsy.  Learning difficulties.  Death.  These risks are highest for babies who are born before 34 weeks of pregnancy. How is preterm labor treated? Treatment depends on the length of your pregnancy, your condition, and the health of your baby. It may involve:  Having a stitch (suture) placed in your cervix to prevent your cervix from opening too early (cerclage).  Taking or being given medicines, such as: ? Hormone medicines. These may be given early in pregnancy to help support the pregnancy. ? Medicine to stop contractions. ? Medicines to help mature the baby's lungs. These may be prescribed if the risk of delivery is high. ? Medicines to prevent your baby from developing cerebral palsy.    If the labor happens before 34 weeks of pregnancy, you may need to stay in the hospital. What should I do if I think I am in preterm labor? If you think that you are going into preterm labor, call your health care provider right away. How can I prevent preterm labor in future pregnancies? To increase your chance of having a full-term pregnancy:  Do not use any tobacco products, such as cigarettes, chewing  tobacco, and e-cigarettes. If you need help quitting, ask your health care provider.  Do not use street drugs or medicines that have not been prescribed to you during your pregnancy.  Talk with your health care provider before taking any herbal supplements, even if you have been taking them regularly.  Make sure you gain a healthy amount of weight during your pregnancy.  Watch for infection. If you think that you might have an infection, get it checked right away.  Make sure to tell your health care provider if you have gone into preterm labor before.  This information is not intended to replace advice given to you by your health care provider. Make sure you discuss any questions you have with your health care provider. Document Released: 01/18/2004 Document Revised: 04/09/2016 Document Reviewed: 03/20/2016 Elsevier Interactive Patient Education  2018 [employer] Inc[employer]  

## 2017-06-13 ENCOUNTER — Encounter (HOSPITAL_COMMUNITY): Payer: Self-pay | Admitting: Certified Registered Nurse Anesthetist

## 2017-06-13 ENCOUNTER — Inpatient Hospital Stay (HOSPITAL_COMMUNITY)
Admission: AD | Admit: 2017-06-13 | Discharge: 2017-06-17 | DRG: 775 | Disposition: A | Discharge status: Home / Self Care | Payer: BLUE CROSS/BLUE SHIELD | Source: Ambulatory Visit | Attending: Obstetrics & Gynecology | Admitting: Obstetrics & Gynecology

## 2017-06-13 ENCOUNTER — Encounter (HOSPITAL_COMMUNITY): Payer: Self-pay

## 2017-06-13 DIAGNOSIS — Z3A34 34 weeks gestation of pregnancy: Secondary | ICD-10-CM

## 2017-06-13 DIAGNOSIS — Z3A37 37 weeks gestation of pregnancy: Secondary | ICD-10-CM | POA: Diagnosis not present

## 2017-06-13 DIAGNOSIS — O1413 Severe pre-eclampsia, third trimester: Secondary | ICD-10-CM | POA: Diagnosis present

## 2017-06-13 DIAGNOSIS — O1414 Severe pre-eclampsia complicating childbirth: Principal | ICD-10-CM | POA: Diagnosis present

## 2017-06-13 DIAGNOSIS — O24420 Gestational diabetes mellitus in childbirth, diet controlled: Secondary | ICD-10-CM | POA: Diagnosis present

## 2017-06-13 DIAGNOSIS — O133 Gestational [pregnancy-induced] hypertension without significant proteinuria, third trimester: Secondary | ICD-10-CM

## 2017-06-13 DIAGNOSIS — Z880 Allergy status to penicillin: Secondary | ICD-10-CM

## 2017-06-13 DIAGNOSIS — O0990 Supervision of high risk pregnancy, unspecified, unspecified trimester: Secondary | ICD-10-CM

## 2017-06-13 DIAGNOSIS — R109 Unspecified abdominal pain: Secondary | ICD-10-CM | POA: Diagnosis not present

## 2017-06-13 HISTORY — DX: Severe pre-eclampsia, third trimester: O14.13

## 2017-06-13 LAB — URINALYSIS, ROUTINE W REFLEX MICROSCOPIC
BILIRUBIN URINE: NEGATIVE
Glucose, UA: NEGATIVE mg/dL
HGB URINE DIPSTICK: NEGATIVE
Ketones, ur: 20 mg/dL — AB
Leukocytes, UA: NEGATIVE
Nitrite: NEGATIVE
PROTEIN: NEGATIVE mg/dL
Specific Gravity, Urine: 1.003 — ABNORMAL LOW (ref 1.005–1.030)
pH: 6 (ref 5.0–8.0)

## 2017-06-13 LAB — COMPREHENSIVE METABOLIC PANEL
ALBUMIN: 2.8 g/dL — AB (ref 3.5–5.0)
ALT: 36 U/L (ref 14–54)
AST: 33 U/L (ref 15–41)
Alkaline Phosphatase: 104 U/L (ref 38–126)
Anion gap: 9 (ref 5–15)
BUN: 10 mg/dL (ref 6–20)
CALCIUM: 9 mg/dL (ref 8.9–10.3)
CHLORIDE: 105 mmol/L (ref 101–111)
CO2: 20 mmol/L — AB (ref 22–32)
CREATININE: 0.6 mg/dL (ref 0.44–1.00)
GFR calc non Af Amer: >60 mL/min (ref >60)
Glucose, Bld: 125 mg/dL — ABNORMAL HIGH (ref 65–99)
Potassium: 3.3 mmol/L — ABNORMAL LOW (ref 3.5–5.1)
SODIUM: 134 mmol/L — AB (ref 135–145)
Total Bilirubin: 0.5 mg/dL (ref 0.3–1.2)
Total Protein: 6.8 g/dL (ref 6.5–8.1)

## 2017-06-13 LAB — CBC
HCT: 35.1 % — ABNORMAL LOW (ref 36.0–46.0)
Hemoglobin: 11.5 g/dL — ABNORMAL LOW (ref 12.0–15.0)
MCH: 26.6 pg (ref 26.0–34.0)
MCHC: 32.8 g/dL (ref 30.0–36.0)
MCV: 81.1 fL (ref 78.0–100.0)
PLATELETS: 158 10*3/uL (ref 150–400)
RBC: 4.33 MIL/uL (ref 3.87–5.11)
RDW: 13.2 % (ref 11.5–15.5)
WBC: 9.3 10*3/uL (ref 4.0–10.5)

## 2017-06-13 LAB — PROTEIN / CREATININE RATIO, URINE: CREATININE, URINE: 25 mg/dL

## 2017-06-13 LAB — TYPE AND SCREEN
ABO/RH(D): A POS
Antibody Screen: NEGATIVE

## 2017-06-13 MED ORDER — TERBUTALINE SULFATE 1 MG/ML IJ SOLN: 0.2500 mg | Freq: Once | INTRAMUSCULAR | Status: DC | PRN

## 2017-06-13 MED ORDER — LACTATED RINGERS IV SOLN
INTRAVENOUS | Status: DC
  Administered 2017-06-13 – 2017-06-14 (×2): via INTRAVENOUS

## 2017-06-13 MED ORDER — LABETALOL HCL 5 MG/ML IV SOLN: 20.0000 mg | INTRAVENOUS | Status: DC | PRN

## 2017-06-13 MED ORDER — MAGNESIUM SULFATE 40 G IN LACTATED RINGERS - SIMPLE
2.0000 g/h | INTRAVENOUS | Status: DC
  Administered 2017-06-14 – 2017-06-15 (×2): 2 g/h via INTRAVENOUS
  Filled 2017-06-13: qty 40
  Filled 2017-06-13: qty 500
  Filled 2017-06-13: qty 40

## 2017-06-13 MED ORDER — OXYCODONE-ACETAMINOPHEN 5-325 MG PO TABS: 2.0000 | ORAL_TABLET | ORAL | Status: DC | PRN

## 2017-06-13 MED ORDER — VANCOMYCIN HCL IN DEXTROSE 1-5 GM/200ML-% IV SOLN
1000.0000 mg | Freq: Two times a day (BID) | INTRAVENOUS | Status: DC
Start: 2017-06-13 — End: 2017-06-15
  Administered 2017-06-13 – 2017-06-14 (×3): 1000 mg via INTRAVENOUS
  Filled 2017-06-13 (×4): qty 200

## 2017-06-13 MED ORDER — HYDRALAZINE HCL 20 MG/ML IJ SOLN: 10.0000 mg | Freq: Once | INTRAMUSCULAR | Status: DC | PRN

## 2017-06-13 MED ORDER — SOD CITRATE-CITRIC ACID 500-334 MG/5ML PO SOLN: 30.0000 mL | ORAL | Status: DC | PRN

## 2017-06-13 MED ORDER — NALBUPHINE HCL 10 MG/ML IJ SOLN
5.0000 mg | INTRAMUSCULAR | Status: DC | PRN
  Administered 2017-06-14 (×4): 5 mg via INTRAVENOUS
  Filled 2017-06-13 (×4): qty 1

## 2017-06-13 MED ORDER — LACTATED RINGERS IV SOLN: 500.0000 mL | INTRAVENOUS | Status: DC | PRN

## 2017-06-13 MED ORDER — ACETAMINOPHEN 325 MG PO TABS: 650.0000 mg | ORAL_TABLET | ORAL | Status: DC | PRN

## 2017-06-13 MED ORDER — MAGNESIUM SULFATE BOLUS VIA INFUSION
4.0000 g | Freq: Once | INTRAVENOUS | Status: AC
  Administered 2017-06-13: 4 g via INTRAVENOUS
  Filled 2017-06-13: qty 500

## 2017-06-13 MED ORDER — ACETAMINOPHEN 500 MG PO TABS
1000.0000 mg | ORAL_TABLET | Freq: Once | ORAL | Status: AC
  Administered 2017-06-13: 1000 mg via ORAL
  Filled 2017-06-13: qty 2

## 2017-06-13 MED ORDER — MISOPROSTOL 50MCG HALF TABLET
50.0000 ug | ORAL_TABLET | ORAL | Status: DC
  Administered 2017-06-13 – 2017-06-14 (×3): 50 ug via ORAL
  Filled 2017-06-13 (×4): qty 1

## 2017-06-13 MED ORDER — ONDANSETRON HCL 4 MG/2ML IJ SOLN: 4.0000 mg | Freq: Four times a day (QID) | INTRAMUSCULAR | Status: DC | PRN

## 2017-06-13 MED ORDER — OXYCODONE-ACETAMINOPHEN 5-325 MG PO TABS: 1.0000 | ORAL_TABLET | ORAL | Status: DC | PRN

## 2017-06-13 MED ORDER — OXYTOCIN 40 UNITS IN LACTATED RINGERS INFUSION - SIMPLE MED
2.5000 [IU]/h | INTRAVENOUS | Status: DC
  Administered 2017-06-15: 2.5 [IU]/h via INTRAVENOUS
  Filled 2017-06-13 (×2): qty 1000

## 2017-06-13 MED ORDER — LIDOCAINE HCL (PF) 1 % IJ SOLN
30.0000 mL | INTRAMUSCULAR | Status: DC | PRN
  Filled 2017-06-13: qty 30

## 2017-06-13 MED ORDER — OXYTOCIN BOLUS FROM INFUSION
500.0000 mL | Freq: Once | INTRAVENOUS | Status: AC
  Administered 2017-06-15: 500 mL via INTRAVENOUS

## 2017-06-13 MED ORDER — BETAMETHASONE SOD PHOS & ACET 6 (3-3) MG/ML IJ SUSP
12.0000 mg | Freq: Once | INTRAMUSCULAR | Status: AC
  Administered 2017-06-13: 12 mg via INTRAMUSCULAR
  Filled 2017-06-13: qty 2

## 2017-06-13 NOTE — MAU Provider Note (Signed)
History     CSN: [id number]  Arrival date and time: 06/13/17 1736   First Provider Initiated Contact with Patient 06/13/17 1839      Chief Complaint  Patient presents with  . Abdominal Pain   Lisa Grimes is a 21 y.o. G1P0 at 9233w3d who presents today for labor check. She reports that she has had contractions off and since yesterday, but getting worse today. She has hx of GHTN dx at 33 weeks. She also has had headaches off and on. She reports that she has a headache right now that she rate 3/10. She denies any VB or LOF. She reports normal fetal movement. A1gdm   Abdominal Pain  This is a new problem. The current episode started today. The onset quality is gradual. The problem occurs intermittently. The problem has been unchanged. The pain is located in the suprapubic region. The pain is at a severity of 5/10. The quality of the pain is cramping. The abdominal pain does not radiate. Associated symptoms include headaches. Pertinent negatives include no fever. Nothing aggravates the pain. The pain is relieved by nothing. She has tried nothing for the symptoms.   Past Medical History:  Diagnosis Date  . Abnormal white blood cell count   . Acute bronchitis   . Anxiety   . Chlamydia 2016  . Depression with anxiety   . Dye allergic reaction   . Epistaxis   . Fatigue   . Gastroesophageal reflux disease   . Hypoglycemia   . Influenza A   . Insomnia   . Major depressive disorder, recurrent episode, moderate with anxious distress (HCC)   . Menstrual headache   . Migraine headache   . Mild major depression (HCC)   . Palpitations   . Panic disorder with agoraphobia and severe panic attacks   . Parent-child relational problem   . Rhinitis, allergic   . Screening for depression   . UTI (lower urinary tract infection)   . Viral syndrome     Past Surgical History:  Procedure Laterality Date  . EYE SURGERY    . MOUTH SURGERY  2010  . STRABISMUS SURGERY  1997  . TONSILLECTOMY  2003   . tubes in ear  2000    Family History  Problem Relation Age of Onset  . Depression Mother   . Anxiety disorder Mother   . Hypertension Mother   . Alcohol abuse Father   . Hypertension Father   . Bipolar disorder Maternal Aunt   . Cancer Maternal Aunt        breast  . Hypertension Maternal Aunt   . Diabetes Maternal Aunt   . Bipolar disorder Maternal Uncle   . Hypertension Maternal Uncle   . Bipolar disorder Paternal Uncle   . Depression Maternal Grandmother   . Stroke Maternal Grandmother   . Hypertension Maternal Grandmother   . Cancer Maternal Grandmother        breast  . Heart disease Maternal Grandmother   . Depression Paternal Grandmother   . Bipolar disorder Paternal Grandmother   . ADD / ADHD Cousin   . Depression Cousin   . Heart attack Maternal Aunt   . Heart disease Maternal Aunt   . Hypertension Maternal Aunt   . Heart attack Maternal Uncle   . Heart disease Maternal Uncle   . Diabetes Maternal Uncle   . Stroke Maternal Grandfather   . Hypertension Maternal Grandfather   . Heart disease Maternal Grandfather     Social History  Substance Use Topics  . Smoking status: Never Smoker  . Smokeless tobacco: Never Used  . Alcohol use No    Allergies:  Allergies  Allergen Reactions  . Amoxicillin Hives    Has patient had a PCN reaction causing immediate rash, facial/tongue/throat swelling, SOB or lightheadedness with hypotension: Yes Has patient had a PCN reaction causing severe rash involving mucus membranes or skin necrosis: No Has patient had a PCN reaction that required hospitalization No Has patient had a PCN reaction occurring within the last 10 years: No If all of the above answers are "NO", then may proceed with Cephalosporin use.   . Latex   . Penicillins Hives    Has patient had a PCN reaction causing immediate rash, facial/tongue/throat swelling, SOB or lightheadedness with hypotension: Yes Has patient had a PCN reaction causing severe rash  involving mucus membranes or skin necrosis: No Has patient had a PCN reaction that required hospitalization No Has patient had a PCN reaction occurring within the last 10 years: no If all of the above answers are "NO", then may proceed with Cephalosporin use.   . Red Dye     possible allergy    Prescriptions Prior to Admission  Medication Sig Dispense Refill Last Dose  . Prenatal Vit-Fe Fumarate-FA (MULTIVITAMIN-PRENATAL) 27-0.8 MG TABS tablet Take 1 tablet by mouth daily at 12 noon.   Taking    Review of Systems  Constitutional: Negative for chills and fever.  Eyes: Negative for visual disturbance.  Gastrointestinal: Positive for abdominal pain.  Genitourinary: Negative for vaginal bleeding and vaginal discharge.  Neurological: Positive for headaches.   Physical Exam   Blood pressure (!) 149/93, pulse (!) 104, temperature 98.2 F (36.8 C), resp. rate 16, height 5\' 4"  (1.626 m), weight 191 lb (86.6 kg), last menstrual period 10/07/2016, SpO2 97 %.  Physical Exam  Nursing note and vitals reviewed. Constitutional: She is oriented to person, place, and time. She appears well-developed and well-nourished. No distress.  HENT:  Head: Normocephalic.  Cardiovascular: Normal rate.   Respiratory: Effort normal.  GI: Soft. There is no tenderness. There is no rebound.  Genitourinary:  Genitourinary Comments: Cervix: FT/thick/-3   Neurological: She is alert and oriented to person, place, and time. She displays normal reflexes. She exhibits normal muscle tone (No clonus ).  Skin: Skin is warm and dry.  Psychiatric: She has a normal mood and affect.   Results for orders placed or performed during the hospital encounter of 06/13/17 (from the past 24 hour(s))  Urinalysis, Routine w reflex microscopic     Status: Abnormal   Collection Time: 06/13/17  6:04 PM  Result Value Ref Range   Color, Urine STRAW (A) YELLOW   APPearance CLEAR CLEAR   Specific Gravity, Urine 1.003 (L) 1.005 - 1.030    pH 6.0 5.0 - 8.0   Glucose, UA NEGATIVE NEGATIVE mg/dL   Hgb urine dipstick NEGATIVE NEGATIVE   Bilirubin Urine NEGATIVE NEGATIVE   Ketones, ur 20 (A) NEGATIVE mg/dL   Protein, ur NEGATIVE NEGATIVE mg/dL   Nitrite NEGATIVE NEGATIVE   Leukocytes, UA NEGATIVE NEGATIVE  Protein / creatinine ratio, urine     Status: None   Collection Time: 06/13/17  6:04 PM  Result Value Ref Range   Creatinine, Urine 25.00 mg/dL   Total Protein, Urine <6 mg/dL   Protein Creatinine Ratio        0.00 - 0.15 mg/mg[Cre]  CBC     Status: Abnormal   Collection Time: 06/13/17  6:52  PM  Result Value Ref Range   WBC 9.3 4.0 - 10.5 K/uL   RBC 4.33 3.87 - 5.11 MIL/uL   Hemoglobin 11.5 (L) 12.0 - 15.0 g/dL   HCT 16.135.1 (L) 09.636.0 - 04.546.0 %   MCV 81.1 78.0 - 100.0 fL   MCH 26.6 26.0 - 34.0 pg   MCHC 32.8 30.0 - 36.0 g/dL   RDW 40.913.2 81.111.5 - 91.415.5 %   Platelets 158 150 - 400 K/uL  Comprehensive metabolic panel     Status: Abnormal   Collection Time: 06/13/17  6:52 PM  Result Value Ref Range   Sodium 134 (L) 135 - 145 mmol/L   Potassium 3.3 (L) 3.5 - 5.1 mmol/L   Chloride 105 101 - 111 mmol/L   CO2 20 (L) 22 - 32 mmol/L   Glucose, Bld 125 (H) 65 - 99 mg/dL   BUN 10 6 - 20 mg/dL   Creatinine, Ser 7.820.60 0.44 - 1.00 mg/dL   Calcium 9.0 8.9 - 95.610.3 mg/dL   Total Protein 6.8 6.5 - 8.1 g/dL   Albumin 2.8 (L) 3.5 - 5.0 g/dL   AST 33 15 - 41 U/L   ALT 36 14 - 54 U/L   Alkaline Phosphatase 104 38 - 126 U/L   Total Bilirubin 0.5 0.3 - 1.2 mg/dL   GFR calc non Af Amer >60 >60 mL/min   GFR calc Af Amer >60 >60 mL/min   Anion gap 9 5 - 15     MAU Course  Procedures  MDM 2020: Patient reports that her headache has not improved with tylenol.  2021: DW Dr. Macon LargeAnyanwu, will admit for IOL 2/2 pre-eclampsia with severe features.   Assessment and Plan  Pre-eclampsia with severe features Admit to labor and delivery IOL with cytotec Magnesium Betamethasone  Thressa ShellerHeather Audreanna Torrisi 06/13/2017, 6:45 PM

## 2017-06-13 NOTE — H&P (Signed)
History   CSN: [id number]  Arrival date and time: 06/13/17 1736   First Provider Initiated Contact with Patient 06/13/17 1839         Chief Complaint  Patient presents with  . Abdominal Pain   Lisa Grimes is a 21 y.o. G1P0 at [redacted]w[redacted]d who presents today for labor check. She reports that she has had contractions off and since yesterday, but getting worse today. She has hx of GHTN dx at 33 weeks. She also has had headaches off and on. She reports that she has a headache right now that she rate 3/10. She denies any VB or LOF. She reports normal fetal movement. A1gdm  Abdominal Pain  This is a new problem. The current episode started today. The onset quality is gradual. The problem occurs intermittently. The problem has been unchanged. The pain is located in the suprapubic region. The pain is at a severity of 5/10. The quality of the pain is cramping. The abdominal pain does not radiate. Associated symptoms include headaches. Pertinent negatives include no fever. Nothing aggravates the pain. The pain is relieved by nothing. She has tried nothing for the symptoms.       Past Medical History:  Diagnosis Date  . Abnormal white blood cell count   . Acute bronchitis   . Anxiety   . Chlamydia 2016  . Depression with anxiety   . Dye allergic reaction   . Epistaxis   . Fatigue   . Gastroesophageal reflux disease   . Hypoglycemia   . Influenza A   . Insomnia   . Major depressive disorder, recurrent episode, moderate with anxious distress (HCC)   . Menstrual headache   . Migraine headache   . Mild major depression (HCC)   . Palpitations   . Panic disorder with agoraphobia and severe panic attacks   . Parent-child relational problem   . Rhinitis, allergic   . Screening for depression   . UTI (lower urinary tract infection)   . Viral syndrome          Past Surgical History:  Procedure Laterality Date  . EYE SURGERY    . MOUTH SURGERY  2010  .  STRABISMUS SURGERY  1997  . TONSILLECTOMY  2003  . tubes in ear  2000         Family History  Problem Relation Age of Onset  . Depression Mother   . Anxiety disorder Mother   . Hypertension Mother   . Alcohol abuse Father   . Hypertension Father   . Bipolar disorder Maternal Aunt   . Cancer Maternal Aunt        breast  . Hypertension Maternal Aunt   . Diabetes Maternal Aunt   . Bipolar disorder Maternal Uncle   . Hypertension Maternal Uncle   . Bipolar disorder Paternal Uncle   . Depression Maternal Grandmother   . Stroke Maternal Grandmother   . Hypertension Maternal Grandmother   . Cancer Maternal Grandmother        breast  . Heart disease Maternal Grandmother   . Depression Paternal Grandmother   . Bipolar disorder Paternal Grandmother   . ADD / ADHD Cousin   . Depression Cousin   . Heart attack Maternal Aunt   . Heart disease Maternal Aunt   . Hypertension Maternal Aunt   . Heart attack Maternal Uncle   . Heart disease Maternal Uncle   . Diabetes Maternal Uncle   . Stroke Maternal Grandfather   . Hypertension Maternal Grandfather   .  Heart disease Maternal Grandfather         Social History  Substance Use Topics  . Smoking status: Never Smoker  . Smokeless tobacco: Never Used  . Alcohol use No    Allergies:       Allergies  Allergen Reactions  . Amoxicillin Hives    Has patient had a PCN reaction causing immediate rash, facial/tongue/throat swelling, SOB or lightheadedness with hypotension: Yes Has patient had a PCN reaction causing severe rash involving mucus membranes or skin necrosis: No Has patient had a PCN reaction that required hospitalization No Has patient had a PCN reaction occurring within the last 10 years: No If all of the above answers are "NO", then may proceed with Cephalosporin use.   . Latex   . Penicillins Hives    Has patient had a PCN reaction causing immediate rash,  facial/tongue/throat swelling, SOB or lightheadedness with hypotension: Yes Has patient had a PCN reaction causing severe rash involving mucus membranes or skin necrosis: No Has patient had a PCN reaction that required hospitalization No Has patient had a PCN reaction occurring within the last 10 years: no If all of the above answers are "NO", then may proceed with Cephalosporin use.   . Red Dye     possible allergy           Prescriptions Prior to Admission  Medication Sig Dispense Refill Last Dose  . Prenatal Vit-Fe Fumarate-FA (MULTIVITAMIN-PRENATAL) 27-0.8 MG TABS tablet Take 1 tablet by mouth daily at 12 noon.   Taking    Review of Systems  Constitutional: Negative for chills and fever.  Eyes: Negative for visual disturbance.  Gastrointestinal: Positive for abdominal pain.  Genitourinary: Negative for vaginal bleeding and vaginal discharge.  Neurological: Positive for headaches.   Physical Exam   Blood pressure (!) 149/93, pulse (!) 104, temperature 98.2 F (36.8 C), resp. rate 16, height 5\' 4"  (1.626 m), weight 191 lb (86.6 kg), last menstrual period 10/07/2016, SpO2 97 %.  Physical Exam  Nursing note and vitals reviewed. Constitutional: She is oriented to person, place, and time. She appears well-developed and well-nourished. No distress.  HENT:  Head: Normocephalic.  Cardiovascular: Normal rate.   Respiratory: Effort normal.  GI: Soft. There is no tenderness. There is no rebound.  Genitourinary:  Genitourinary Comments: Cervix: FT/thick/-3   Neurological: She is alert and oriented to person, place, and time. She displays normal reflexes. She exhibits normal muscle tone (No clonus ).  Skin: Skin is warm and dry.  Psychiatric: She has a normal mood and affect.   Lab Results Last 24 Hours       Results for orders placed or performed during the hospital encounter of 06/13/17 (from the past 24 hour(s))  Urinalysis, Routine w reflex microscopic     Status:  Abnormal   Collection Time: 06/13/17  6:04 PM  Result Value Ref Range   Color, Urine STRAW (A) YELLOW   APPearance CLEAR CLEAR   Specific Gravity, Urine 1.003 (L) 1.005 - 1.030   pH 6.0 5.0 - 8.0   Glucose, UA NEGATIVE NEGATIVE mg/dL   Hgb urine dipstick NEGATIVE NEGATIVE   Bilirubin Urine NEGATIVE NEGATIVE   Ketones, ur 20 (A) NEGATIVE mg/dL   Protein, ur NEGATIVE NEGATIVE mg/dL   Nitrite NEGATIVE NEGATIVE   Leukocytes, UA NEGATIVE NEGATIVE  Protein / creatinine ratio, urine     Status: None   Collection Time: 06/13/17  6:04 PM  Result Value Ref Range   Creatinine, Urine 25.00 mg/dL  Total Protein, Urine <6 mg/dL   Protein Creatinine Ratio        0.00 - 0.15 mg/mg[Cre]  CBC     Status: Abnormal   Collection Time: 06/13/17  6:52 PM  Result Value Ref Range   WBC 9.3 4.0 - 10.5 K/uL   RBC 4.33 3.87 - 5.11 MIL/uL   Hemoglobin 11.5 (L) 12.0 - 15.0 g/dL   HCT 16.135.1 (L) 09.636.0 - 04.546.0 %   MCV 81.1 78.0 - 100.0 fL   MCH 26.6 26.0 - 34.0 pg   MCHC 32.8 30.0 - 36.0 g/dL   RDW 40.913.2 81.111.5 - 91.415.5 %   Platelets 158 150 - 400 K/uL  Comprehensive metabolic panel     Status: Abnormal   Collection Time: 06/13/17  6:52 PM  Result Value Ref Range   Sodium 134 (L) 135 - 145 mmol/L   Potassium 3.3 (L) 3.5 - 5.1 mmol/L   Chloride 105 101 - 111 mmol/L   CO2 20 (L) 22 - 32 mmol/L   Glucose, Bld 125 (H) 65 - 99 mg/dL   BUN 10 6 - 20 mg/dL   Creatinine, Ser 7.820.60 0.44 - 1.00 mg/dL   Calcium 9.0 8.9 - 95.610.3 mg/dL   Total Protein 6.8 6.5 - 8.1 g/dL   Albumin 2.8 (L) 3.5 - 5.0 g/dL   AST 33 15 - 41 U/L   ALT 36 14 - 54 U/L   Alkaline Phosphatase 104 38 - 126 U/L   Total Bilirubin 0.5 0.3 - 1.2 mg/dL   GFR calc non Af Amer >60 >60 mL/min   GFR calc Af Amer >60 >60 mL/min   Anion gap 9 5 - 15     FHT: 145, moderate with 15x15 accels, no decels Toco: about every 3-7 mins   MAU Course  Procedures  MDM 2020: Patient reports that her headache has  not improved with tylenol.  2021: DW Dr. Macon LargeAnyanwu, will admit for IOL 2/2 pre-eclampsia with severe features.   Assessment and Plan  Pre-eclampsia with severe features Admit to labor and delivery IOL with cytotec Magnesium Betamethasone Labetalol protocol ordered PRN severe range blood pressures  Thressa ShellerHeather Aliena Ghrist 06/13/2017, 6:45 PM

## 2017-06-13 NOTE — MAU Note (Signed)
Pt has been having abdominal cramping and cramping in her back and legs since 2pm. Intermittent on abdomen, but constant on back. 7/10. No bleeding or LOF.

## 2017-06-14 LAB — GLUCOSE, CAPILLARY
GLUCOSE-CAPILLARY: 137 mg/dL — AB (ref 65–99)
GLUCOSE-CAPILLARY: 152 mg/dL — AB (ref 65–99)
GLUCOSE-CAPILLARY: 143 mg/dL — AB (ref 65–99)
GLUCOSE-CAPILLARY: 143 mg/dL — AB (ref 65–99)
GLUCOSE-CAPILLARY: 174 mg/dL — AB (ref 65–99)
GLUCOSE-CAPILLARY: 109 mg/dL — AB (ref 65–99)
GLUCOSE-CAPILLARY: 118 mg/dL — AB (ref 65–99)
GLUCOSE-CAPILLARY: 124 mg/dL — AB (ref 65–99)
Glucose-Capillary: 156 mg/dL — ABNORMAL HIGH (ref 65–99)
Glucose-Capillary: 110 mg/dL — ABNORMAL HIGH (ref 65–99)
Glucose-Capillary: 148 mg/dL — ABNORMAL HIGH (ref 65–99)
Glucose-Capillary: 105 mg/dL — ABNORMAL HIGH (ref 65–99)
Glucose-Capillary: 116 mg/dL — ABNORMAL HIGH (ref 65–99)
Glucose-Capillary: 132 mg/dL — ABNORMAL HIGH (ref 65–99)
Glucose-Capillary: 127 mg/dL — ABNORMAL HIGH (ref 65–99)

## 2017-06-14 LAB — MAGNESIUM: MAGNESIUM: 5.4 mg/dL — AB (ref 1.7–2.4)

## 2017-06-14 LAB — RPR: RPR Ser Ql: NONREACTIVE

## 2017-06-14 MED ORDER — OXYTOCIN 40 UNITS IN LACTATED RINGERS INFUSION - SIMPLE MED
1.0000 m[IU]/min | INTRAVENOUS | Status: DC
  Administered 2017-06-14: 2 m[IU]/min via INTRAVENOUS

## 2017-06-14 MED ORDER — DEXTROSE IN LACTATED RINGERS 5 % IV SOLN
INTRAVENOUS | Status: DC
  Administered 2017-06-14: 11:00:00 via INTRAVENOUS

## 2017-06-14 MED ORDER — BETAMETHASONE SOD PHOS & ACET 6 (3-3) MG/ML IJ SUSP
12.0000 mg | Freq: Once | INTRAMUSCULAR | Status: AC
  Administered 2017-06-14: 12 mg via INTRAMUSCULAR
  Filled 2017-06-14: qty 2

## 2017-06-14 MED ORDER — SODIUM CHLORIDE 0.9 % IV SOLN
INTRAVENOUS | Status: DC
  Administered 2017-06-14: 0.8 [IU]/h via INTRAVENOUS
  Filled 2017-06-14 (×2): qty 1

## 2017-06-14 NOTE — Progress Notes (Signed)
Pt complaining of chest pain and shoulder pain--Fellow made aware-attending made aware orders to obtain a Mag level at [employer]

## 2017-06-14 NOTE — Progress Notes (Signed)
Labor Progress Note Lisa Grimes is a 21 y.o. G1P0 at 258w4d presented for IOL for pre E w/SF by BP S: Asleep with partner at bedside. Feeling increased contraction pain  O:  BP (!) 135/54   Pulse (!) 108   Temp 97.9 F (36.6 C) (Axillary)   Resp 18   Ht 5\' 4"  (1.626 m)   Wt 191 lb (86.6 kg)   LMP 10/07/2016   SpO2 97%   BMI 32.79 kg/m  EFM: 140/moderate/+accel/-devel  CVE: Dilation: 3 Effacement (%): 60 Cervical Position: Posterior Station: Ballotable Presentation: Vertex Exam by:: J.Thornton, RN    A&P: 21 y.o. G1P0 7558w4d IOL  #Labor: Continuing with Cytote. Plan on CRB #Pain: IV pain medication #FWB: Cat1 #GBS not done; vanc prophylaxis for preterm  #Preterm: S/p BMZ x1. Mg for Pre E. Plan second dose of BMZ #pre E w/sf. Mild range pressures. #GDMA1- Placed on insulin drip with CBG well controlled    John Giovanniorey P [name], MD 10:00 PM

## 2017-06-14 NOTE — Progress Notes (Signed)
LABOR PROGRESS NOTE  Lisa Grimes is a 21 y.o. G1P0 at [redacted]w[redacted]d  admitted for IOL for pre-eclampsia with SF.  Subjective: Denies any concerns at this time.  Objective: BP (!) 145/78   Pulse (!) 102   Temp 98.7 F (37.1 C) (Oral)   Resp 18   Ht 5\' 4"  (1.626 m)   Wt 191 lb (86.6 kg)   LMP 10/07/2016   SpO2 97%   BMI 32.79 kg/m  or    SVE: 1/thick/high FHT: baseline rate 125, moderate varibility, +acel, no decel Toco: ctx q1-6  Assessment / Plan: 21 y.o. G1P0 at 637w4d here for pre-E with SF. A1GDM  Labor: s/p cytotec #3 at 0819, FB in place. Continue cervical ripening Fetal Wellbeing:  Cat I PreE w/ SF: on IV mag. No signs/sx of tocxicity A1GMD: IV insulin (glucostabilizer) ordered this morning d/t elevated CBG Anticipated MOD:  VSD  Lisa PearJulie P Degele, MD 06/14/2017,

## 2017-06-14 NOTE — Anesthesia Pain Management Evaluation Note (Signed)
  CRNA Pain Management Visit Note  Patient: Lisa Grimes, 21 y.o., female  "Hello I am a member of the anesthesia team at [facility]. We have an anesthesia team available at all times to provide care throughout the hospital, including epidural management and anesthesia for C-section. I don't know your plan for the delivery whether it a natural birth, water birth, IV sedation, nitrous supplementation, doula or epidural, but we want to meet your pain goals."   1.Was your pain managed to your expectations on prior hospitalizations?   No prior hospitalizations  2.What is your expectation for pain management during this hospitalization?     Epidural  3.How can we help you reach that goal? epidural  Record the patient's initial score and the patient's pain goal.   Pain: 8  Pain Goal: 4 [facility] wants you to be able to say your pain was always managed very well.  Kellyjo Edgren 06/14/2017

## 2017-06-14 NOTE — Progress Notes (Signed)
MD notified of glucose result-MD to place orders for IV insulin

## 2017-06-14 NOTE — Progress Notes (Signed)
Orders to start pt on glucosestabalizer--awaiting CRNA for IV start-pt difficult stick

## 2017-06-14 NOTE — Progress Notes (Signed)
Labor Progress Note [name] is a 21 y.o. G1P0 at 532w4d presented for IOL for pre E w/SF by BP S: resting comfortably in room  O:  BP 138/82   Pulse 83   Temp 98.3 F (36.8 C) (Oral)   Resp 17   Ht 5\' 4"  (1.626 m)   Wt 191 lb (86.6 kg)   LMP 10/07/2016   SpO2 97%   BMI 32.79 kg/m  EFM: 140/moderate/+accel/-devel  CVE: Dilation: 1 Effacement (%): Thick Cervical Position: Posterior Station: -2 Presentation: Vertex Exam by:: Dr. Sedalia Mutaox   A&P: 21 y.o. G1P0 302w4d IOL  #Labor: Continuing with Cytote. Plan on CRB #Pain: IV pain medication #FWB: Cat1 #GBS not done; vanc prophylaxis for preterm  #PTL: S/p BMZ. Mg for Pre E #pre E w/sf. Mild range pressures.   [name], MD 2:37 AM

## 2017-06-15 ENCOUNTER — Inpatient Hospital Stay (HOSPITAL_COMMUNITY): Payer: BLUE CROSS/BLUE SHIELD | Admitting: Anesthesiology

## 2017-06-15 ENCOUNTER — Encounter (HOSPITAL_COMMUNITY): Payer: Self-pay | Admitting: *Deleted

## 2017-06-15 DIAGNOSIS — O1414 Severe pre-eclampsia complicating childbirth: Secondary | ICD-10-CM

## 2017-06-15 DIAGNOSIS — Z3A34 34 weeks gestation of pregnancy: Secondary | ICD-10-CM

## 2017-06-15 LAB — GLUCOSE, CAPILLARY
GLUCOSE-CAPILLARY: 109 mg/dL — AB (ref 65–99)
GLUCOSE-CAPILLARY: 138 mg/dL — AB (ref 65–99)
GLUCOSE-CAPILLARY: 90 mg/dL (ref 65–99)
Glucose-Capillary: 134 mg/dL — ABNORMAL HIGH (ref 65–99)
Glucose-Capillary: 115 mg/dL — ABNORMAL HIGH (ref 65–99)
Glucose-Capillary: 125 mg/dL — ABNORMAL HIGH (ref 65–99)
Glucose-Capillary: 105 mg/dL — ABNORMAL HIGH (ref 65–99)
Glucose-Capillary: 103 mg/dL — ABNORMAL HIGH (ref 65–99)
Glucose-Capillary: 89 mg/dL (ref 65–99)

## 2017-06-15 LAB — CBC
HCT: 34.9 % — ABNORMAL LOW (ref 36.0–46.0)
HEMOGLOBIN: 11.5 g/dL — AB (ref 12.0–15.0)
MCH: 26.9 pg (ref 26.0–34.0)
MCHC: 33 g/dL (ref 30.0–36.0)
MCV: 81.5 fL (ref 78.0–100.0)
Platelets: 145 10*3/uL — ABNORMAL LOW (ref 150–400)
RBC: 4.28 MIL/uL (ref 3.87–5.11)
RDW: 13.4 % (ref 11.5–15.5)
WBC: 13 10*3/uL — AB (ref 4.0–10.5)

## 2017-06-15 MED ORDER — MAGNESIUM SULFATE 40 G IN LACTATED RINGERS - SIMPLE
2.0000 g/h | INTRAVENOUS | Status: AC
  Administered 2017-06-16: 2 g/h via INTRAVENOUS
  Filled 2017-06-15 (×2): qty 500

## 2017-06-15 MED ORDER — EPHEDRINE 5 MG/ML INJ: 10.0000 mg | INTRAVENOUS | Status: DC | PRN

## 2017-06-15 MED ORDER — LACTATED RINGERS IV SOLN
INTRAVENOUS | Status: DC
  Administered 2017-06-15 – 2017-06-16 (×2): via INTRAVENOUS

## 2017-06-15 MED ORDER — OXYCODONE HCL 5 MG PO TABS: 5.0000 mg | ORAL_TABLET | ORAL | Status: DC | PRN

## 2017-06-15 MED ORDER — EPHEDRINE 5 MG/ML INJ
10.0000 mg | INTRAVENOUS | Status: DC | PRN
  Filled 2017-06-15: qty 2

## 2017-06-15 MED ORDER — FENTANYL 2.5 MCG/ML BUPIVACAINE 1/10 % EPIDURAL INFUSION (WH - ANES)
14.0000 mL/h | INTRAMUSCULAR | Status: DC | PRN
  Administered 2017-06-15: 14 mL/h via EPIDURAL
  Filled 2017-06-15: qty 100

## 2017-06-15 MED ORDER — DIBUCAINE 1 % RE OINT: 1.0000 "application " | TOPICAL_OINTMENT | RECTAL | Status: DC | PRN

## 2017-06-15 MED ORDER — ONDANSETRON HCL 4 MG/2ML IJ SOLN: 4.0000 mg | INTRAMUSCULAR | Status: DC | PRN

## 2017-06-15 MED ORDER — SIMETHICONE 80 MG PO CHEW
80.0000 mg | CHEWABLE_TABLET | ORAL | Status: DC | PRN
Start: 2017-06-15 — End: 2017-06-17

## 2017-06-15 MED ORDER — OXYCODONE HCL 5 MG PO TABS
10.0000 mg | ORAL_TABLET | ORAL | Status: DC | PRN
  Administered 2017-06-15: 10 mg via ORAL
  Filled 2017-06-15: qty 2

## 2017-06-15 MED ORDER — DIPHENHYDRAMINE HCL 50 MG/ML IJ SOLN
12.5000 mg | INTRAMUSCULAR | Status: DC | PRN
  Administered 2017-06-15: 12.5 mg via INTRAVENOUS
  Filled 2017-06-15: qty 1

## 2017-06-15 MED ORDER — LACTATED RINGERS IV SOLN: 500.0000 mL | Freq: Once | INTRAVENOUS | Status: DC

## 2017-06-15 MED ORDER — PHENYLEPHRINE 40 MCG/ML (10ML) SYRINGE FOR IV PUSH (FOR BLOOD PRESSURE SUPPORT)
80.0000 ug | PREFILLED_SYRINGE | INTRAVENOUS | Status: DC | PRN
  Filled 2017-06-15: qty 5

## 2017-06-15 MED ORDER — TETANUS-DIPHTH-ACELL PERTUSSIS 5-2.5-18.5 LF-MCG/0.5 IM SUSP
0.5000 mL | Freq: Once | INTRAMUSCULAR | Status: AC
  Administered 2017-06-16: 0.5 mL via INTRAMUSCULAR
  Filled 2017-06-15: qty 0.5

## 2017-06-15 MED ORDER — WITCH HAZEL-GLYCERIN EX PADS: 1.0000 "application " | MEDICATED_PAD | CUTANEOUS | Status: DC | PRN

## 2017-06-15 MED ORDER — ONDANSETRON HCL 4 MG PO TABS: 4.0000 mg | ORAL_TABLET | ORAL | Status: DC | PRN

## 2017-06-15 MED ORDER — BENZOCAINE-MENTHOL 20-0.5 % EX AERO: 1.0000 "application " | INHALATION_SPRAY | CUTANEOUS | Status: DC | PRN

## 2017-06-15 MED ORDER — ACETAMINOPHEN 325 MG PO TABS: 650.0000 mg | ORAL_TABLET | ORAL | Status: DC | PRN

## 2017-06-15 MED ORDER — PHENYLEPHRINE 40 MCG/ML (10ML) SYRINGE FOR IV PUSH (FOR BLOOD PRESSURE SUPPORT)
80.0000 ug | PREFILLED_SYRINGE | INTRAVENOUS | Status: DC | PRN
  Filled 2017-06-15: qty 5
  Filled 2017-06-15: qty 10

## 2017-06-15 MED ORDER — PHENYLEPHRINE 40 MCG/ML (10ML) SYRINGE FOR IV PUSH (FOR BLOOD PRESSURE SUPPORT): 80.0000 ug | PREFILLED_SYRINGE | INTRAVENOUS | Status: DC | PRN

## 2017-06-15 MED ORDER — MISOPROSTOL 200 MCG PO TABS
ORAL_TABLET | ORAL | Status: AC
  Administered 2017-06-15: 600 ug via BUCCAL
  Filled 2017-06-15: qty 3

## 2017-06-15 MED ORDER — COCONUT OIL OIL: 1.0000 "application " | TOPICAL_OIL | Status: DC | PRN

## 2017-06-15 MED ORDER — IBUPROFEN 600 MG PO TABS
600.0000 mg | ORAL_TABLET | Freq: Four times a day (QID) | ORAL | Status: DC
  Administered 2017-06-15 – 2017-06-16 (×6): 600 mg via ORAL
  Filled 2017-06-15 (×7): qty 1

## 2017-06-15 MED ORDER — MISOPROSTOL 200 MCG PO TABS
600.0000 ug | ORAL_TABLET | Freq: Once | ORAL | Status: AC
  Administered 2017-06-15: 600 ug via BUCCAL

## 2017-06-15 MED ORDER — LIDOCAINE HCL (PF) 1 % IJ SOLN
INTRAMUSCULAR | Status: DC | PRN
  Administered 2017-06-15: 5 mL
  Administered 2017-06-15: 3 mL
  Administered 2017-06-15: 2 mL

## 2017-06-15 NOTE — Progress Notes (Signed)
Pt in wc to NICU with husband and RN to see baby.   Pt tolerated well no compliants

## 2017-06-15 NOTE — Anesthesia Procedure Notes (Signed)
Epidural Patient location during procedure: OB Start time: 06/15/2017 2:50 AM End time: 06/15/2017 2:59 AM  Staffing Anesthesiologist: Marcene DuosFITZGERALD, Roddie Riegler Performed: anesthesiologist   Preanesthetic Checklist Completed: patient identified, site marked, surgical consent, pre-op evaluation, timeout performed, IV checked, risks and benefits discussed and monitors and equipment checked  Epidural Patient position: sitting Prep: site prepped and draped and DuraPrep Patient monitoring: continuous pulse ox and blood pressure Approach: midline Location: L4-L5 Injection technique: LOR air  Needle:  Needle type: Tuohy  Needle gauge: 17 G Needle length: 9 cm and 9 Needle insertion depth: 8 cm Catheter type: closed end flexible Catheter size: 19 Gauge Catheter at skin depth: 14 (13-->14cm when laid in lat decub position) cm Test dose: negative  Assessment Events: blood not aspirated, injection not painful, no injection resistance, negative IV test and no paresthesia

## 2017-06-15 NOTE — Progress Notes (Signed)
Labor Progress Note Lisa Grimes is a 21 y.o. G1P0 at 7474w4d presented for IOL for pre E w/SF by BP S: Asleep with partner at bedside. Contraction pain improved  O:  BP 139/78   Pulse (!) 105   Temp 98 F (36.7 C) (Oral)   Resp 18   Ht 5\' 4"  (1.626 m)   Wt 191 lb (86.6 kg)   LMP 10/07/2016   SpO2 97%   BMI 32.79 kg/m  EFM: 135/moderate/-accel/-devel  CVE: Dilation: 3 Effacement (%): 50, 60 Cervical Position: Posterior Station: -2 Presentation: Vertex Exam by:: Melburn PopperMichelle Williams, RN   A&P: 21 y.o. G1P0 1074w4d IOL  #Labor: Continuing with pitocin titration as tolerated #Pain: IV pain medication #FWB: Cat1 #GBS not done; vanc prophylaxis for preterm  #Preterm: S/p BMZ x1. Mg for Pre E. Plan second dose of BMZ #pre E w/sf. Mild range pressures. #GDMA1- Placed on insulin drip with CBG well controlled    John Giovanniorey P Cordarrell Sane, MD 1:25 AM

## 2017-06-15 NOTE — Progress Notes (Signed)
LABOR PROGRESS NOTE  Lisa Grimes is a 21 y.o. G1P0 at 5751w4d  admitted for IOL for pre-eclampsia with SF.  Subjective: Denies any concerns at this time.  Objective: BP 139/78   Pulse (!) 105   Temp 98 F (36.7 C) (Oral)   Resp 18   Ht 5\' 4"  (1.626 m)   Wt 191 lb (86.6 kg)   LMP 10/07/2016   SpO2 97%   BMI 32.79 kg/m  or    SVE: 3/60/-3 FHT: baseline rate 125, moderate varibility, +acel, no decel Toco: ctx q1-6  Assessment / Plan: 21 y.o. G1P0 at 2051w4d here for pre-E with SF. A1GDM  Labor: s/p cytotec x3 and GB. Now on Pitocin. Continue to titrate Fetal Wellbeing:  Cat I PreE w/ SF: on IV mag. No signs/sx of tocxicity A1GMD: on IV insulin (glucostabilizer) Anticipated MOD:  VSD  Frederik PearJulie P Malaki Koury, MD 06/15/2017

## 2017-06-15 NOTE — Anesthesia Preprocedure Evaluation (Signed)
Anesthesia Evaluation  Patient identified by MRN, date of birth, ID band Patient awake    Reviewed: Allergy & Precautions, Patient's Chart, lab work & pertinent test results  Airway Mallampati: II  TM Distance: >3 FB     Dental   Pulmonary neg pulmonary ROS,    Pulmonary exam normal        Cardiovascular hypertension (Pre-E on Mg), Pt. on medications Normal cardiovascular exam     Neuro/Psych  Headaches, Anxiety Depression    GI/Hepatic Neg liver ROS, GERD  ,  Endo/Other  negative endocrine ROS  Renal/GU negative Renal ROS     Musculoskeletal   Abdominal   Peds  Hematology Thrombocytopenia   Anesthesia Other Findings   Reproductive/Obstetrics (+) Pregnancy                             Lab Results  Component Value Date   WBC 13.0 (H) 06/15/2017   HGB 11.5 (L) 06/15/2017   HCT 34.9 (L) 06/15/2017   MCV 81.5 06/15/2017   PLT 145 (L) 06/15/2017   Lab Results  Component Value Date   CREATININE 0.60 06/13/2017   BUN 10 06/13/2017   NA 134 (L) 06/13/2017   K 3.3 (L) 06/13/2017   CL 105 06/13/2017   CO2 20 (L) 06/13/2017    Anesthesia Physical Anesthesia Plan  ASA: III  Anesthesia Plan: Epidural   Post-op Pain Management:    Induction:   PONV Risk Score and Plan: Treatment may vary due to age or medical condition  Airway Management Planned: Natural Airway  Additional Equipment:   Intra-op Plan:   Post-operative Plan:   Informed Consent: I have reviewed the patients History and Physical, chart, labs and discussed the procedure including the risks, benefits and alternatives for the proposed anesthesia with the patient or authorized representative who has indicated his/her understanding and acceptance.     Plan Discussed with:   Anesthesia Plan Comments:         Anesthesia Quick Evaluation

## 2017-06-15 NOTE — Progress Notes (Signed)
LABOR PROGRESS NOTE  Lisa Grimes is a 21 y.o. G1P0 at 6162w4d  admitted for IOL for pre-eclampsia with SF.  Subjective: Pt doing well. Comfortable with epdiural  Objective: BP (!) 137/95   Pulse 98   Temp 97.8 F (36.6 C) (Axillary)   Resp 18   Ht 5\' 4"  (1.626 m)   Wt 191 lb (86.6 kg)   LMP 10/07/2016   SpO2 97%   BMI 32.79 kg/m  or    SVE: 3.5/70/-2 FHT: baseline rate 120, moderate varibility, +acel, no decel Toco: ctx q2-304min  Assessment / Plan: 21 y.o. G1P0 at 5462w4d here for IOL for pre-E with SF. S/p FB and cytotec, now on Pitocin. Pregnancy also complicated by A1GDM.  Labor: AROM at 0430. Continue Pit, currently at 5118mU/min Fetal Wellbeing:  Cat I PreE w/ SF: on IV mag. No signs/sx of tocxicity A1GMD: on IV insulin (glucostabilizer) Anticipated MOD:  VSD  Lisa PearJulie P Elianny Buxbaum, MD 06/15/2017

## 2017-06-15 NOTE — Anesthesia Postprocedure Evaluation (Signed)
Anesthesia Post Note  Patient: Lisa Grimes  Procedure(s) Performed: * No procedures listed *     Patient location during evaluation: Mother Baby Anesthesia Type: Epidural Level of consciousness: awake and alert and oriented Pain management: satisfactory to patient Vital Signs Assessment: post-procedure vital signs reviewed and stable Respiratory status: spontaneous breathing and nonlabored ventilation Cardiovascular status: stable Postop Assessment: no headache, no backache, no signs of nausea or vomiting, adequate PO intake and patient able to bend at knees (patient up walking) Anesthetic complications: no    Last Vitals:  Vitals:   06/15/17 1155 06/15/17 1300  BP: 135/77 130/84  Pulse: 78 76  Resp: 20 20  Temp: 36.7 C 37.1 C    Last Pain:  Vitals:   06/15/17 1300  TempSrc: Oral  PainSc:    Pain Goal: Patients Stated Pain Goal: 6 (06/14/17 0006)               Madison HickmanGREGORY,Taishawn Smaldone

## 2017-06-15 NOTE — Progress Notes (Signed)
Patient to NICU prior to transferring to room 312.  Ravinder Hofland TimberonBrown Emilyn Ruble, [place] 06/15/2017 11:54 AM

## 2017-06-15 NOTE — Progress Notes (Signed)
Per Dr Cliffton AstersWhite, pt to remain on Glucostabilizer for the time being. Last CBG missed due to delivery. Will check CBG and adjust accordingly. RN will continue to assess and monitor patient during postpartum recovery period.  Nasri Boakye MaconBrown Emonni Depasquale, [place] 06/15/2017 10:08 AM

## 2017-06-16 ENCOUNTER — Other Ambulatory Visit: Payer: BLUE CROSS/BLUE SHIELD | Admitting: Obstetrics and Gynecology

## 2017-06-16 NOTE — Progress Notes (Signed)
Post Partum Day 1 SVD, IOL for PEC Subjective: Pt sitting up eating. Still feels a little groggy from the magnesium. Has been up to void. Pain controlled. Bottle feeding Pt denies HA or visual changes Objective: Blood pressure 136/81, pulse 72, temperature 97.9 F (36.6 C), temperature source Oral, resp. rate 18, height 5\' 4"  (1.626 m), weight 86.6 kg (191 lb), last menstrual period 10/07/2016, SpO2 99 %, unknown if currently breastfeeding.  Physical Exam:  General: no distress Lochia: appropriate Uterine Fundus: firm Incision: NA DVT Evaluation: No evidence of DVT seen on physical exam.   Recent Labs  06/13/17 1852 06/15/17 0144  HGB 11.5* 11.5*  HCT 35.1* 34.9*    Assessment/Plan: PPD # 1 SVD PEC GDM, A1 Off magnesium. BP stable. Will continue to monitor. BS stable. Continue with progressive care. Possible discharge tomorrow.    LOS: 3 days   Hermina StaggersMichael L Cherrill Scrima 06/16/2017, 10:23 AM

## 2017-06-16 NOTE — Plan of Care (Signed)
Problem: Pain Management: Goal: General experience of comfort will improve and pain level will decrease Outcome: Progressing Discussed pain regimen with patient scheduled and adding PRN meds if needed. Pt. Aware to call for pain needs.   Problem: Bowel/Gastric: Goal: Gastrointestinal status will improve Outcome: Progressing Pt. Encouraged to drink plenty of fluids, eat balanced diet and request stool softner if she experiences constipation onset.

## 2017-06-16 NOTE — Progress Notes (Signed)
Pt c/o chest pain in upper middle of her chest when she takes a deep breath.  bilateral breath sounds clear, HOB raised to 30 degrees, offered mylicon pt refused stated, I be ok.

## 2017-06-17 DIAGNOSIS — Z3A37 37 weeks gestation of pregnancy: Secondary | ICD-10-CM

## 2017-06-17 DIAGNOSIS — O1414 Severe pre-eclampsia complicating childbirth: Secondary | ICD-10-CM

## 2017-06-17 MED ORDER — IBUPROFEN 600 MG PO TABS: 600.0000 mg | ORAL_TABLET | Freq: Four times a day (QID) | ORAL | 0 refills | Status: DC

## 2017-06-17 NOTE — Discharge Summary (Addendum)
Obstetric Discharge Summary Reason for Admission: [facility] Procedures: ultrasound Intrapartum Procedures: spontaneous vaginal delivery Postpartum Procedures: Magnesium therapy Complications-Operative and Postpartum: none Hemoglobin  Date Value Ref Range Status  06/15/2017 11.5 (L) 12.0 - 15.0 g/dL Final  16/10/960407/26/2018 54.012.1 11.1 - 15.9 g/dL Final   HCT  Date Value Ref Range Status  06/15/2017 34.9 (L) 36.0 - 46.0 % Final   Hematocrit  Date Value Ref Range Status  06/05/2017 36.2 34.0 - 46.6 % Final   Hospital course: Pt present for labor check and noted to have severe PEC by BP. [facility] problems. See delivery note for additional information. Postpartum course was unremarkable. Received magnesium therapy for24 hrs. BP 130's/70-80's off magnesium and without meds. No other s/Sx of SPEC. Felt pt amendable for discharge home on PPD # 3.  Physical Exam:  General: no distress Lochia: appropriate Uterine Fundus: firm Incision: NA DVT Evaluation: No evidence of DVT seen on physical exam, trace edema, nl DTR's  Discharge Diagnoses: SVD                                          PEC  Discharge Information: Date: 06/17/2017 Activity: pelvic rest Diet: routine Medications: PNV and Ibuprofen Condition: stable Instructions: refer to practice specific booklet Discharge to: home Follow-up Information    [facility]. Schedule an appointment as soon as possible for a visit in 1 week(s).   Specialty:  Obstetrics and Gynecology Why:  Appt this week for BP check and in 4 weeks for postpartum visit Contact information: [address] [place] [id number] [phone]          Newborn Data: Live born female  Birth Weight: 5 lb 0.1 oz (2270 g) APGAR: 6, 6  Stable in NICU  Lisa StaggersMichael L Caidan Grimes 06/17/2017, 8:30 AM

## 2017-06-17 NOTE — Progress Notes (Signed)
Pt. Is discharged in the care of husband Denies any pain or discomfort. Understands all discharged instructions well Questions asked and answered. Stable. Denies any heavy bleeding or pain. Infant to remain in [place].

## 2017-06-19 ENCOUNTER — Encounter: Payer: BLUE CROSS/BLUE SHIELD | Admitting: Advanced Practice Midwife

## 2017-06-19 ENCOUNTER — Other Ambulatory Visit: Payer: BLUE CROSS/BLUE SHIELD

## 2017-06-20 NOTE — Progress Notes (Signed)
  CLINICAL SOCIAL WORK MATERNAL/CHILD NOTE  Patient Details  Name: Lisa Grimes MRN: [id number] Date of Birth: 06/15/2018  Date:  06/20/2017  Clinical Social Worker Initiating Note:  Laurey Arrow Date/ Time Initiated:  06/20/17/1517     Child's Name:  Lisa Grimes   Legal Guardian:  Mother Lisa Grimes)   Need for Interpreter:  None   Date of Referral:  06/20/17     Reason for Referral:  Behavioral Health Issues, including SI  (hx of anxiety and depression)   Referral Source:  NICU   Address:  [address] [place] [address]  Phone number:  [phone]   Household Members:  Self, Spouse   Natural Supports (not living in the home):  Immediate Family, Extended Family ([name]'s immediate family will also be a source of support. )   Professional Supports:     Employment:     Type of Work:     Education:  Contractor:  Multimedia programmer   Other Resources:   ([name] plans to apply for [facility])   Cultural/Religious Considerations Which May Impact Care:  None Reported  Strengths:  Ability to meet basic needs , Understanding of illness, Home prepared for child    Risk Factors/Current Problems:  Mental Health Concerns    Cognitive State:  Able to Concentrate , Alert , Linear Thinking , Insightful    Mood/Affect:  Calm , Bright , Interested , Comfortable , Happy    CSW Assessment: CSW met with [name] and [name] while they were visiting infant in room 206.  [name] gave CSW permission to meet with [name] at infant's bedside while [name] was present. [name] was pleasant and receptive to meeting with CSW.  CSW explained CSW's role assessed family for psychological stressors; [name] denied all stressors. [name] and [name] also denied any barriers to visiting with infant as often as they would like.  CSW inquired about [name]'s thoughts and feelings about NICU admission and [name] reported overall feeling good.  [name] stated initially [name] was feeling  overwhelmed, but now feels more in control of thing. CSW validated and normalized [name]'s thoughts and feelings and offer resources for outpatient interventions; [name] declined. CSW assessed for safety and [name] denied SI and HI. [name] presented with insight and awareness regarding her [place] and did not demonstrate any acute signs or symptoms of depression/anxiwty. CSW provided education regarding Baby Blues vs PMADs and provided [name] with information about support groups held at [facility] encouraged [name] to evaluate her mental health throughout the postpartum period with the use of the New Mom Checklist developed by [facility] and notify a medical professional if symptoms arise.    CSW will check in with [name] and offer emotional support throughout NICU admission.  CSW Plan/Description:  Information/Referral to [employer] , Psychosocial Support and Ongoing Assessment of Needs, Patient/Family Education    Laurey Arrow, MSW, [employer] [facility] Clinical Social Work [phone]  Dimple Nanas, LCSW 06/20/2017, 3:20 PM

## 2017-06-23 ENCOUNTER — Other Ambulatory Visit (INDEPENDENT_AMBULATORY_CARE_PROVIDER_SITE_OTHER): Payer: BLUE CROSS/BLUE SHIELD

## 2017-06-23 ENCOUNTER — Telehealth: Payer: Self-pay | Admitting: Obstetrics & Gynecology

## 2017-06-23 DIAGNOSIS — R300 Dysuria: Secondary | ICD-10-CM | POA: Diagnosis not present

## 2017-06-23 DIAGNOSIS — N390 Urinary tract infection, site not specified: Secondary | ICD-10-CM | POA: Diagnosis not present

## 2017-06-23 LAB — POCT URINALYSIS DIPSTICK
Glucose, UA: NEGATIVE
KETONES UA: NEGATIVE
Nitrite, UA: NEGATIVE

## 2017-06-23 NOTE — Telephone Encounter (Signed)
Pt called stating that stating that she recently had a baby and now is having burning with urination, an odor to her urine and her urine looks cloudy. No c/o fever. Asked pt to come by and leave a urine specimen for us to dip. Pt verbalized understanding.

## 2017-06-23 NOTE — Telephone Encounter (Signed)
Patient called stating that shew would like to speak with a nurse, she just ha d a baby recently and she is having issues. Please contact pt

## 2017-06-24 ENCOUNTER — Telehealth: Payer: Self-pay | Admitting: Adult Health

## 2017-06-24 LAB — MICROSCOPIC EXAMINATION
Casts: NONE SEEN /lpf
Epithelial Cells (non renal): NONE SEEN /hpf (ref 0–10)

## 2017-06-24 LAB — URINALYSIS, ROUTINE W REFLEX MICROSCOPIC
BILIRUBIN UA: NEGATIVE
Glucose, UA: NEGATIVE
KETONES UA: NEGATIVE
NITRITE UA: NEGATIVE
SPEC GRAV UA: 1.006 (ref 1.005–1.030)
UUROB: 0.2 mg/dL (ref 0.2–1.0)
pH, UA: 7.5 (ref 5.0–7.5)

## 2017-06-24 NOTE — Telephone Encounter (Signed)
Spoke with pt letting her know urine culture is not back yet. Advised to call back Thursday if she hasn't heard from us. Pt states she is feeling worse and would like something called in. Please advise. Thanks!! JSY

## 2017-06-25 ENCOUNTER — Other Ambulatory Visit: Payer: Self-pay | Admitting: Advanced Practice Midwife

## 2017-06-25 MED ORDER — SULFAMETHOXAZOLE-TRIMETHOPRIM 800-160 MG PO TABS: 1.0000 | ORAL_TABLET | Freq: Two times a day (BID) | ORAL | 0 refills | Status: DC

## 2017-06-25 NOTE — Progress Notes (Unsigned)
Septra (allergies to red dye/pcn)  for worsenting UTI sx (dysuria) no fever/back pain.  Culture still pending

## 2017-07-19 IMAGING — US US ABDOMEN COMPLETE
1 series · 13 of 25 positions shown · non-contrast
Comparison: January 21, 2006

CLINICAL DATA: Six day history of right upper quadrant pain with
nausea and vomiting

EXAM:
ULTRASOUND ABDOMEN COMPLETE

[Series 1: us abdomen complete · 0.18mm/px · 13 of 102 slices shown]
[im 1/102]
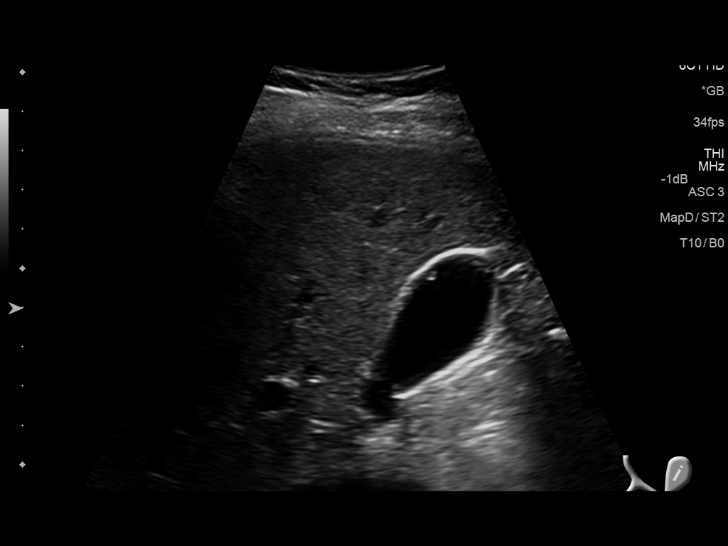
[im 9/102]
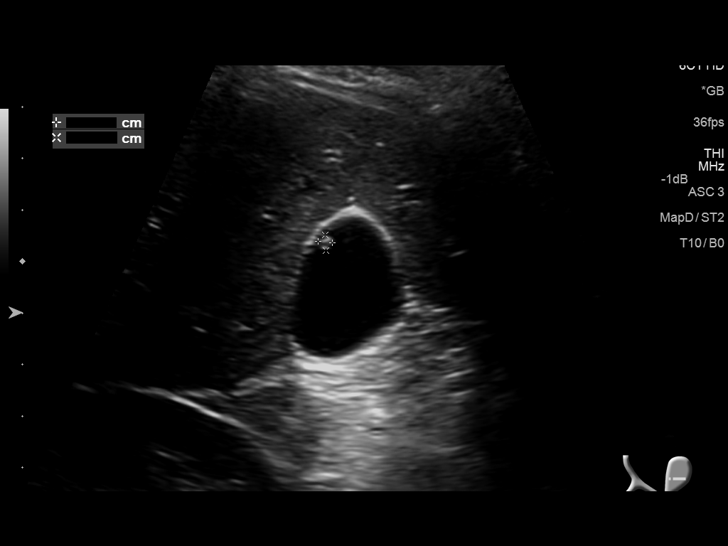
[im 17/102]
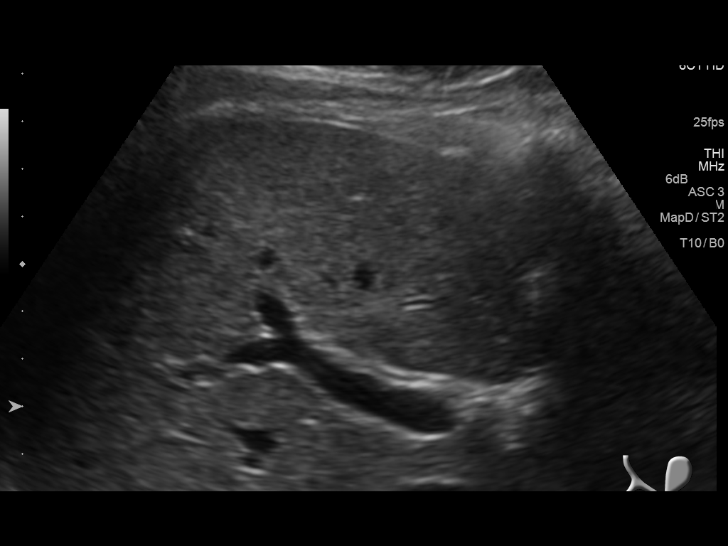
[im 26/102]
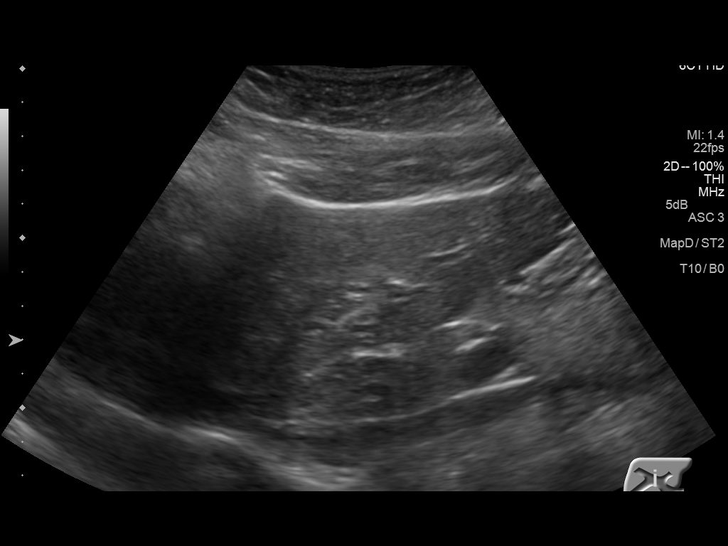
[im 34/102]
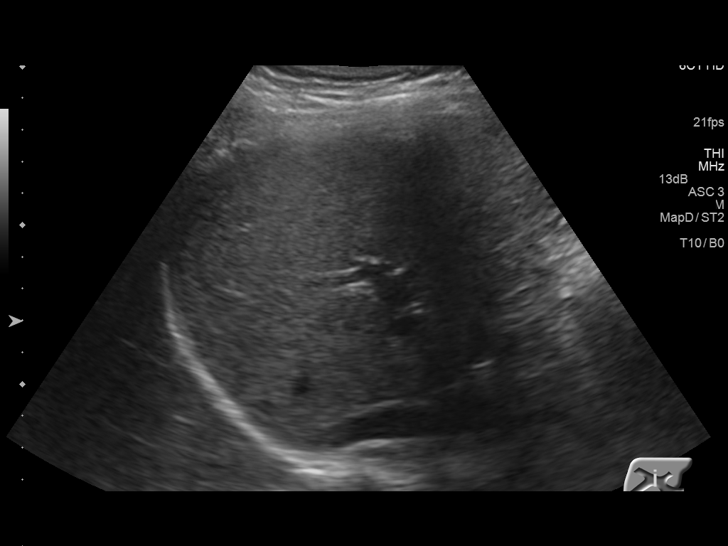
[im 43/102]
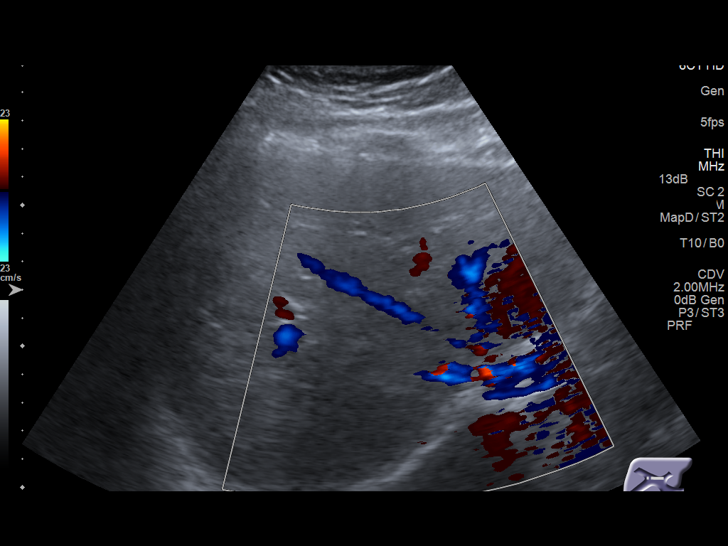
[im 51/102]
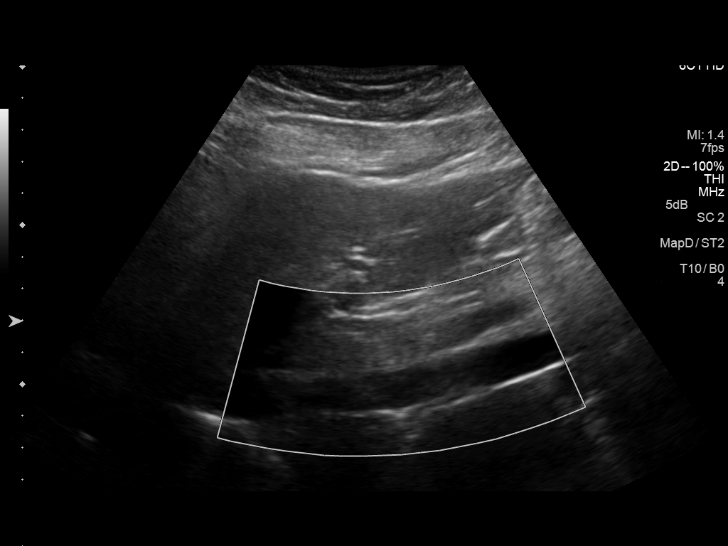
[im 59/102]
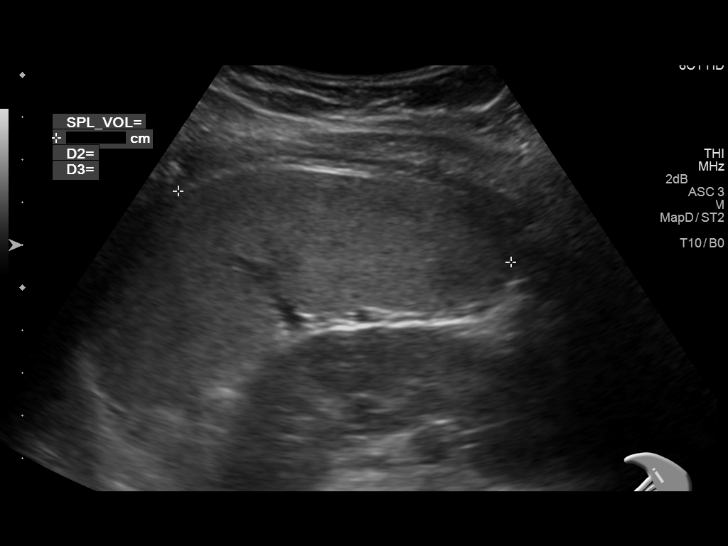
[im 68/102]
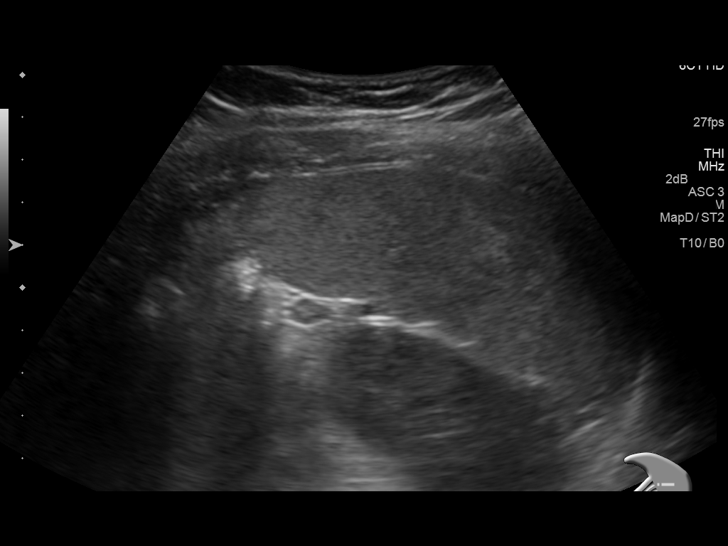
[im 76/102]
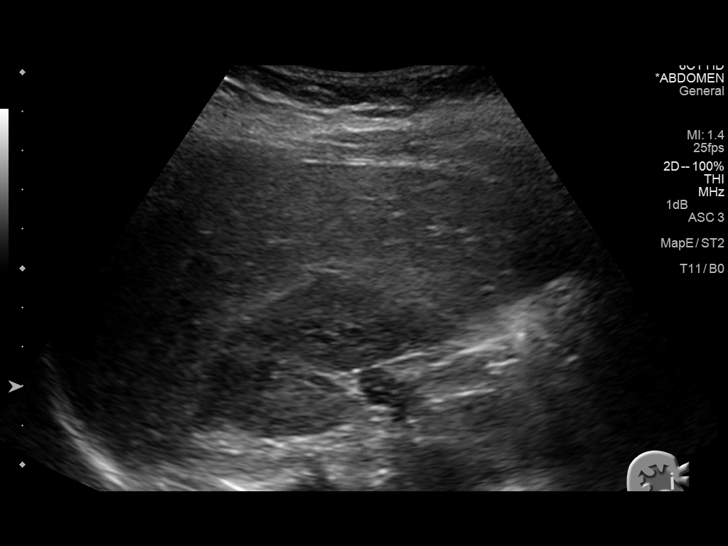
[im 85/102]
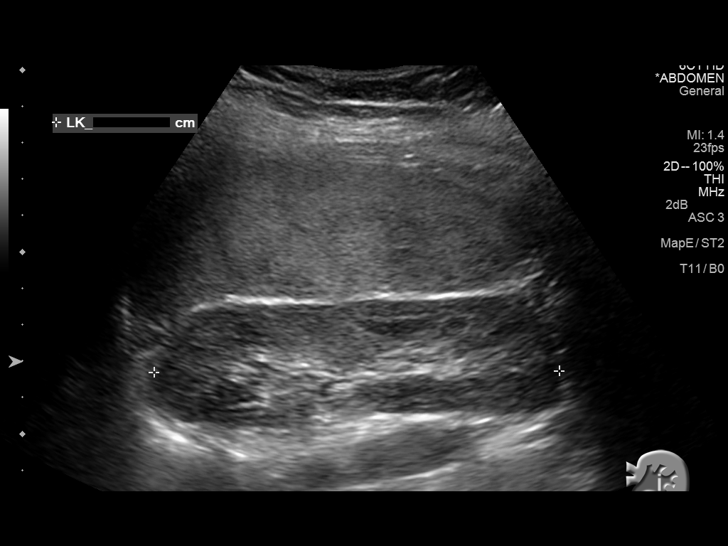
[im 93/102]
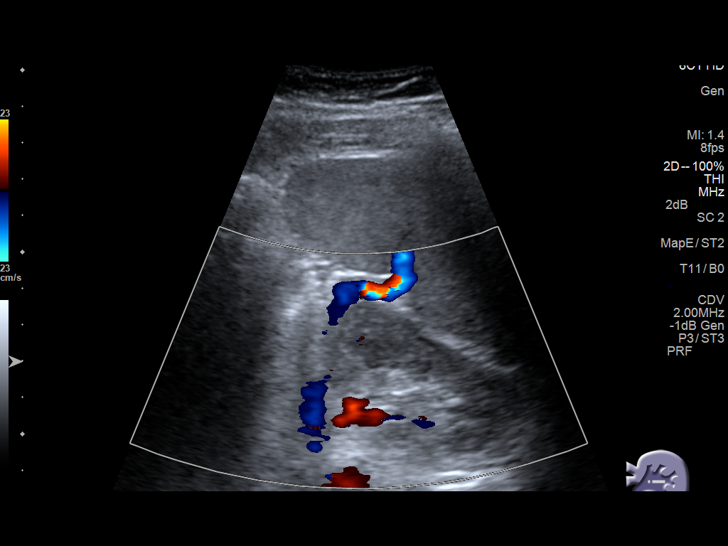
[im 102/102]
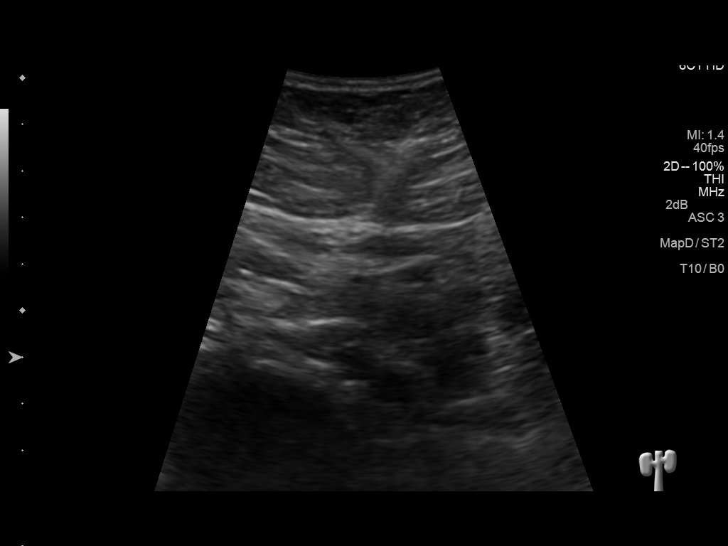

[13 of 25 positions shown; findings below may reference images not displayed]

FINDINGS: Gallbladder: Within the gallbladder, there is a 4 x 3 x 3 mm
echogenic focus along the anterior wall which neither moves nor
shadows. This focus most likely represents a small polyp. There are
no echogenic foci which move and shadow as is expected with
gallstones. There is no gallbladder wall thickening or
pericholecystic fluid. No sonographic Murphy sign noted.

Common bile duct: Diameter: 2 mm. There is no intrahepatic, common
hepatic, or common bile duct dilatation.

Liver: No focal lesion identified. Within normal limits in
parenchymal echogenicity.

IVC: No abnormality visualized.

Pancreas: No mass or inflammatory focus.

Spleen: Size and appearance within normal limits.

Right Kidney: Length: 10.3 cm. Echogenicity within normal limits. No
mass or hydronephrosis visualized.

Left Kidney: Length: 11.1 cm. Echogenicity within normal limits. No
mass or hydronephrosis visualized.

Abdominal aorta: No aneurysm visualized.

Other findings: No demonstrable ascites.
IMPRESSION: 4 x 3 x 3 mm probable polyp within the gallbladder. Gallbladder
otherwise appears unremarkable. Given this finding, follow-up
ultrasound gallbladder approximately 1 year is advised to assess for
stability. Study otherwise unremarkable.

## 2017-07-30 ENCOUNTER — Ambulatory Visit: Payer: BLUE CROSS/BLUE SHIELD | Admitting: Adult Health

## 2018-01-03 ENCOUNTER — Encounter (HOSPITAL_COMMUNITY): Payer: Self-pay | Admitting: *Deleted

## 2018-01-03 ENCOUNTER — Emergency Department (HOSPITAL_COMMUNITY)
Admission: EM | Admit: 2018-01-03 | Discharge: 2018-01-03 | Disposition: A | Discharge status: Home / Self Care | Payer: BLUE CROSS/BLUE SHIELD | Attending: Emergency Medicine | Admitting: Emergency Medicine

## 2018-01-03 ENCOUNTER — Encounter: Payer: Self-pay | Admitting: Family Medicine

## 2018-01-03 ENCOUNTER — Ambulatory Visit (INDEPENDENT_AMBULATORY_CARE_PROVIDER_SITE_OTHER): Payer: BLUE CROSS/BLUE SHIELD | Admitting: Family Medicine

## 2018-01-03 VITALS — BP 110/60 | HR 115 | Temp 99.4°F | Resp 16 | Wt 181.6 lb

## 2018-01-03 DIAGNOSIS — Z91040 Latex allergy status: Secondary | ICD-10-CM | POA: Diagnosis not present

## 2018-01-03 DIAGNOSIS — E876 Hypokalemia: Secondary | ICD-10-CM | POA: Diagnosis not present

## 2018-01-03 DIAGNOSIS — E860 Dehydration: Secondary | ICD-10-CM | POA: Insufficient documentation

## 2018-01-03 DIAGNOSIS — R197 Diarrhea, unspecified: Secondary | ICD-10-CM | POA: Diagnosis present

## 2018-01-03 DIAGNOSIS — K529 Noninfective gastroenteritis and colitis, unspecified: Secondary | ICD-10-CM

## 2018-01-03 DIAGNOSIS — M545 Low back pain, unspecified: Secondary | ICD-10-CM

## 2018-01-03 LAB — POCT URINALYSIS DIPSTICK
Bilirubin, UA: NEGATIVE
Blood, UA: NEGATIVE
GLUCOSE UA: NEGATIVE
Ketones, UA: POSITIVE
LEUKOCYTES UA: NEGATIVE
Nitrite, UA: NEGATIVE
PROTEIN UA: NEGATIVE
Spec Grav, UA: 1.015 (ref 1.010–1.025)
UROBILINOGEN UA: 0.2 U/dL
pH, UA: 6 (ref 5.0–8.0)

## 2018-01-03 LAB — CBC WITH DIFFERENTIAL/PLATELET
BASOS ABS: 0 10*3/uL (ref 0.0–0.1)
Basophils Relative: 1 %
EOS PCT: 0 %
Eosinophils Absolute: 0 10*3/uL (ref 0.0–0.7)
HEMATOCRIT: 44.2 % (ref 36.0–46.0)
HEMOGLOBIN: 14.2 g/dL (ref 12.0–15.0)
LYMPHS PCT: 20 %
Lymphs Abs: 0.8 10*3/uL (ref 0.7–4.0)
MCH: 25.4 pg — ABNORMAL LOW (ref 26.0–34.0)
MCHC: 32.1 g/dL (ref 30.0–36.0)
MCV: 79.1 fL (ref 78.0–100.0)
Monocytes Absolute: 0.5 10*3/uL (ref 0.1–1.0)
Monocytes Relative: 11 %
NEUTROS ABS: 2.8 10*3/uL (ref 1.7–7.7)
NEUTROS PCT: 68 %
Platelets: 173 10*3/uL (ref 150–400)
RBC: 5.59 MIL/uL — AB (ref 3.87–5.11)
RDW: 14.1 % (ref 11.5–15.5)
WBC: 4.1 10*3/uL (ref 4.0–10.5)

## 2018-01-03 LAB — BASIC METABOLIC PANEL
ANION GAP: 13 (ref 5–15)
BUN: 12 mg/dL (ref 6–20)
CHLORIDE: 103 mmol/L (ref 101–111)
CO2: 23 mmol/L (ref 22–32)
Calcium: 9.1 mg/dL (ref 8.9–10.3)
Creatinine, Ser: 0.85 mg/dL (ref 0.44–1.00)
GFR calc Af Amer: >60 mL/min (ref >60)
Glucose, Bld: 107 mg/dL — ABNORMAL HIGH (ref 65–99)
Potassium: 3.4 mmol/L — ABNORMAL LOW (ref 3.5–5.1)
SODIUM: 139 mmol/L (ref 135–145)

## 2018-01-03 LAB — I-STAT BETA HCG BLOOD, ED (MC, WL, AP ONLY): I-stat hCG, quantitative: <5 m[IU]/mL (ref <5)

## 2018-01-03 MED ORDER — METOCLOPRAMIDE HCL 5 MG/ML IJ SOLN
5.0000 mg | Freq: Once | INTRAMUSCULAR | Status: AC
  Administered 2018-01-03: 5 mg via INTRAVENOUS
  Filled 2018-01-03: qty 2

## 2018-01-03 MED ORDER — POTASSIUM CHLORIDE CRYS ER 20 MEQ PO TBCR
40.0000 meq | EXTENDED_RELEASE_TABLET | Freq: Once | ORAL | Status: AC
Start: 2018-01-03 — End: 2018-01-03
  Administered 2018-01-03: 40 meq via ORAL
  Filled 2018-01-03: qty 2

## 2018-01-03 MED ORDER — ONDANSETRON HCL 4 MG PO TABS: 4.0000 mg | ORAL_TABLET | Freq: Three times a day (TID) | ORAL | 0 refills | Status: DC | PRN

## 2018-01-03 MED ORDER — SODIUM CHLORIDE 0.9 % IV BOLUS (SEPSIS)
1000.0000 mL | Freq: Once | INTRAVENOUS | Status: AC
  Administered 2018-01-03: 1000 mL via INTRAVENOUS

## 2018-01-03 NOTE — Progress Notes (Signed)
Patient: Lisa DeerCallie M Grimes Female    DOB: 02/22/1996   22 y.o.   MRN: [id number] Visit Date: 01/03/2018  Today's Provider: Shirlee LatchAngela Bacigalupo, MD   No chief complaint on file.  Subjective:    HPI Patient here today C/O vomiting, diarrhea, and stomach ache x's 2-3 days. Patient reports vomiting 2-3 times a day. Patient reports diarrhea started yesterday and has had 3-4 episodes. Patient reports period started about days ago, so not sure if that has anything to do with symptoms. Patient reports she is 6 months post partum. Patient denies taking any over the counter for diarrhea. Patient reports she is taking small sips of water and will tolerate unless she eats or drinks a decent amount.   Tried Tylenol/ibuprofen for pain.    Allergies  Allergen Reactions  . Amoxicillin Hives    Has patient had a PCN reaction causing immediate rash, facial/tongue/throat swelling, SOB or lightheadedness with hypotension: Yes Has patient had a PCN reaction causing severe rash involving mucus membranes or skin necrosis: No Has patient had a PCN reaction that required hospitalization No Has patient had a PCN reaction occurring within the last 10 years: No If all of the above answers are "NO", then may proceed with Cephalosporin use.   . Latex   . Penicillins Hives    Has patient had a PCN reaction causing immediate rash, facial/tongue/throat swelling, SOB or lightheadedness with hypotension: Yes Has patient had a PCN reaction causing severe rash involving mucus membranes or skin necrosis: No Has patient had a PCN reaction that required hospitalization No Has patient had a PCN reaction occurring within the last 10 years: no If all of the above answers are "NO", then may proceed with Cephalosporin use.   . Red Dye     possible allergy     Current Outpatient Medications:  .  ibuprofen (ADVIL,MOTRIN) 600 MG tablet, Take 1 tablet (600 mg total) by mouth every 6 (six) hours., Disp: 30 tablet, Rfl:  0  Review of Systems  Constitutional: Positive for appetite change, chills, fatigue and fever.  HENT: Positive for ear pain and sore throat. Negative for congestion, dental problem, ear discharge, hearing loss, mouth sores, nosebleeds, postnasal drip, rhinorrhea, sinus pressure, sinus pain, sneezing, tinnitus, trouble swallowing and voice change.   Respiratory: Negative.   Cardiovascular: Negative.   Gastrointestinal: Positive for abdominal distention, abdominal pain, diarrhea, nausea and vomiting. Negative for blood in stool and constipation.  Genitourinary: Positive for decreased urine volume, dysuria (patient is unsure, because everything hurts) and vaginal bleeding. Negative for flank pain, genital sores, hematuria, urgency, vaginal discharge and vaginal pain.  Musculoskeletal: Positive for back pain.  Skin: Negative.     Social History   Tobacco Use  . Smoking status: Never Smoker  . Smokeless tobacco: Never Used  Substance Use Topics  . Alcohol use: No   Objective:   BP 110/60 (BP Location: Right Arm, Patient Position: Sitting, Cuff Size: Large)   Pulse (!) 115   Temp 99.4 F (37.4 C) (Oral)   Resp 16   Wt 181 lb 9.6 oz (82.4 kg)   SpO2 99%   BMI 31.17 kg/m  Vitals:   01/03/18 1001  BP: 110/60  Pulse: (!) 115  Resp: 16  Temp: 99.4 F (37.4 C)  TempSrc: Oral  SpO2: 99%  Weight: 181 lb 9.6 oz (82.4 kg)     Physical Exam  Constitutional: She is oriented to person, place, and time. She appears well-developed and  well-nourished. No distress.  HENT:  Head: Normocephalic and atraumatic.  Right Ear: Tympanic membrane, external ear and ear canal normal.  Left Ear: Tympanic membrane, external ear and ear canal normal.  Nose: Nose normal. Right sinus exhibits no maxillary sinus tenderness and no frontal sinus tenderness. Left sinus exhibits no maxillary sinus tenderness and no frontal sinus tenderness.  Mouth/Throat: Uvula is midline, oropharynx is clear and moist and  mucous membranes are normal. No oropharyngeal exudate.  Eyes: Conjunctivae and EOM are normal. Pupils are equal, round, and reactive to light. No scleral icterus.  Neck: Neck supple.  Cardiovascular: Regular rhythm, normal heart sounds and intact distal pulses. Tachycardia present.  No murmur heard. Pulmonary/Chest: Effort normal and breath sounds normal. No respiratory distress. She has no wheezes. She has no rales.  Abdominal: Soft. Normal appearance and bowel sounds are normal. She exhibits no distension. There is no hepatosplenomegaly. There is generalized tenderness. There is no rebound, no guarding and no CVA tenderness.  Musculoskeletal: She exhibits no edema.  Lymphadenopathy:    She has no cervical adenopathy.  Neurological: She is alert and oriented to person, place, and time.  Skin: Skin is warm and dry. No rash noted.  Psychiatric:  Intermittently tearful  Vitals reviewed.   Results for orders placed or performed in visit on 01/03/18  POCT urinalysis dipstick  Result Value Ref Range   Color, UA yellow    Clarity, UA clear    Glucose, UA Negative    Bilirubin, UA Negative    Ketones, UA positive    Spec Grav, UA 1.015 1.010 - 1.025   Blood, UA Negative    pH, UA 6.0 5.0 - 8.0   Protein, UA Negative    Urobilinogen, UA 0.2 0.2 or 1.0 E.U./dL   Nitrite, UA Negative    Leukocytes, UA Negative Negative       Assessment & Plan:     1. Gastroenteritis - patient's symptoms c/w viral gastroenteritis - no red flags on exam - treat symptomatically - Rx for zofran - discussed BRAT diet - return precautions discussed  2. Dehydration - patient with dry MM, tachycardia, ketones in urine and decreased UOP - advised pushing fluids and bland foods - advised that she may need to go to ER for IV fluids if she continues to be unable to self-hydrate - POCT urinalysis dipstick  3. Low back pain without sciatica, unspecified back pain laterality, unspecified chronicity -  likely related to GI discomfort - UA clear without signs of infection - POCT urinalysis dipstick    Meds ordered this encounter  Medications  . ondansetron (ZOFRAN) 4 MG tablet    Sig: Take 1 tablet (4 mg total) by mouth every 8 (eight) hours as needed for nausea or vomiting.    Dispense:  30 tablet    Refill:  0     Return if symptoms worsen or fail to improve.   The entirety of the information documented in the History of Present Illness, Review of Systems and Physical Exam were personally obtained by me. Portions of this information were initially documented by Rondel Baton, CMA and reviewed by me for thoroughness and accuracy.    Erasmo Downer, MD, MPH [facility] 01/03/2018 10:38 AM

## 2018-01-03 NOTE — ED Triage Notes (Signed)
Pt sent here from pcp due to dehydration from diarrhea severe.

## 2018-01-03 NOTE — ED Provider Notes (Signed)
[facility] EMERGENCY DEPARTMENT Provider Note   CSN: [id number] Arrival date & time: 01/03/18  1326     History   Chief Complaint Chief Complaint  Patient presents with  . Dehydration    HPI Lisa Grimes is a 22 y.o. female.  HPI  Pt. began to have diarrhea 3 days ago and vomiting 2 days ago.  She has had approximately 5 episodes of bloody diarrhea daily for the past 3 days mild vomited once yesterday and once today.  Other associated symptoms include mild lightheadedness worse with standing and feeling thirsty.  She feels mildly nauseated presently.  She reports fever yesterday of 102 degrees.  She feels slightly better today than yesterday.  She saw her primary care physician today who suggested that she come to the emergency department for "fluids" other associated symptoms include intermittent crampy diffuse abdominal pain.  Mild at present.  Low back pain and vaginal bleeding.  She is presently on her menstrual period which she reports is slightly heavier than normal.  No other associated symptoms Past Medical History:  Diagnosis Date  . Abnormal white blood cell count   . Acute bronchitis   . Anxiety   . Chlamydia 2016  . Depression with anxiety   . Dye allergic reaction   . Epistaxis   . Fatigue   . Gastroesophageal reflux disease   . Hypoglycemia   . Influenza A   . Insomnia   . Major depressive disorder, recurrent episode, moderate with anxious distress (HCC)   . Menstrual headache   . Migraine headache   . Mild major depression (HCC)   . Palpitations   . Panic disorder with agoraphobia and severe panic attacks   . Parent-child relational problem   . Rhinitis, allergic   . Screening for depression   . UTI (lower urinary tract infection)   . Viral syndrome     Patient Active Problem List   Diagnosis Date Noted  . Preeclampsia, severe, third trimester 06/13/2017  . Gestational hypertension 06/05/2017  . Supervision of high risk pregnancy, antepartum  04/22/2017  . Depression with anxiety 04/22/2017  . UTI (urinary tract infection) during pregnancy, first trimester 04/22/2017  . Anxiety 02/20/2015  . Fatigue 02/20/2015  . Acid reflux 02/20/2015  . Headache, menstrual migraine 02/20/2015  . Depression, major, recurrent, moderate (HCC) 02/20/2015  . Adaptive colitis 02/20/2015  . Headache, migraine 02/20/2015  . Panic disorder with agoraphobia and severe panic attacks 02/20/2015    Past Surgical History:  Procedure Laterality Date  . EYE SURGERY    . MOUTH SURGERY  2010  . STRABISMUS SURGERY  1997  . TONSILLECTOMY  2003  . tubes in ear  2000    OB History    Gravida Para Term Preterm AB Living   1 1   1   1    SAB TAB Ectopic Multiple Live Births         0 1       Home Medications    Prior to Admission medications   Medication Sig Start Date End Date Taking? Authorizing Provider  ibuprofen (ADVIL,MOTRIN) 600 MG tablet Take 1 tablet (600 mg total) by mouth every 6 (six) hours. 06/17/17   Hermina Staggers, MD  ondansetron (ZOFRAN) 4 MG tablet Take 1 tablet (4 mg total) by mouth every 8 (eight) hours as needed for nausea or vomiting. 01/03/18   Beryle Flock Marzella Schlein, MD    Family History Family History  Problem Relation Age of Onset  .  Depression Mother   . Anxiety disorder Mother   . Hypertension Mother   . Alcohol abuse Father   . Hypertension Father   . Kidney disease Father   . Bipolar disorder Maternal Aunt   . Cancer Maternal Aunt        breast  . Hypertension Maternal Aunt   . Diabetes Maternal Aunt   . Bipolar disorder Maternal Uncle   . Hypertension Maternal Uncle   . Bipolar disorder Paternal Uncle   . Depression Maternal Grandmother   . Stroke Maternal Grandmother   . Hypertension Maternal Grandmother   . Cancer Maternal Grandmother        breast  . Heart disease Maternal Grandmother   . Depression Paternal Grandmother   . Bipolar disorder Paternal Grandmother   . Kidney disease Paternal Grandmother    . ADD / ADHD Cousin   . Depression Cousin   . Heart attack Maternal Aunt   . Heart disease Maternal Aunt   . Hypertension Maternal Aunt   . Heart attack Maternal Uncle   . Heart disease Maternal Uncle   . Diabetes Maternal Uncle   . Stroke Maternal Grandfather   . Hypertension Maternal Grandfather   . Heart disease Maternal Grandfather     Social History Social History   Tobacco Use  . Smoking status: Never Smoker  . Smokeless tobacco: Never Used  Substance Use Topics  . Alcohol use: No  . Drug use: No     Allergies   Amoxicillin; Latex; Penicillins; and Red dye   Review of Systems Review of Systems  Constitutional: Positive for fever.  HENT: Negative.   Respiratory: Negative.   Cardiovascular: Negative.   Gastrointestinal: Positive for abdominal pain, diarrhea and vomiting.  Genitourinary: Positive for vaginal bleeding.  Musculoskeletal: Positive for back pain.  Skin: Negative.   Neurological: Positive for light-headedness.  Psychiatric/Behavioral: Negative.   All other systems reviewed and are negative.    Physical Exam Updated Vital Signs BP 134/85 (BP Location: Right Arm)   Pulse (!) 104   Temp 98.6 F (37 C) (Oral)   Resp 18   Ht 5\' 4"  (1.626 m)   Wt 82.1 kg (181 lb)   LMP 12/31/2017   SpO2 99%   BMI 31.07 kg/m   Physical Exam  Constitutional: She is oriented to person, place, and time. She appears well-developed and well-nourished. No distress.  HENT:  Head: Normocephalic and atraumatic.  Eyes: Conjunctivae are normal. Pupils are equal, round, and reactive to light.  Neck: Neck supple. No tracheal deviation present. No thyromegaly present.  Cardiovascular: Normal rate and regular rhythm.  No murmur heard. Pulmonary/Chest: Effort normal and breath sounds normal.  Abdominal: Soft. Bowel sounds are normal. She exhibits no distension. There is tenderness.  minimal diffuse tenderness  Genitourinary:  Genitourinary Comments: No flank  tenderness   Musculoskeletal: Normal range of motion. She exhibits no edema or tenderness.  Neurological: She is alert and oriented to person, place, and time. Coordination normal.  Skin: Skin is warm and dry. Capillary refill takes less than 2 seconds. No rash noted.  Psychiatric: She has a normal mood and affect. Her behavior is normal.  Nursing note and vitals reviewed.    ED Treatments / Results  Labs (all labs ordered are listed, but only abnormal results are displayed) Labs Reviewed  BASIC METABOLIC PANEL  CBC WITH DIFFERENTIAL/PLATELET  I-STAT BETA HCG BLOOD, ED ([facility], [facility], [facility] ONLY)    EKG  EKG Interpretation None  Radiology No results found.  Procedures Procedures (including critical care time)  Medications Ordered in ED Medications  sodium chloride 0.9 % bolus 1,000 mL (not administered)  metoCLOPramide (REGLAN) injection 5 mg (not administered)   Results for orders placed or performed during the hospital encounter of 01/03/18  Basic metabolic panel  Result Value Ref Range   Sodium 139 135 - 145 mmol/L   Potassium 3.4 (L) 3.5 - 5.1 mmol/L   Chloride 103 101 - 111 mmol/L   CO2 23 22 - 32 mmol/L   Glucose, Bld 107 (H) 65 - 99 mg/dL   BUN 12 6 - 20 mg/dL   Creatinine, Ser 1.610.85 0.44 - 1.00 mg/dL   Calcium 9.1 8.9 - 09.610.3 mg/dL   GFR calc non Af Amer >60 >60 mL/min   GFR calc Af Amer >60 >60 mL/min   Anion gap 13 5 - 15  CBC with Differential/Platelet  Result Value Ref Range   WBC 4.1 4.0 - 10.5 K/uL   RBC 5.59 (H) 3.87 - 5.11 MIL/uL   Hemoglobin 14.2 12.0 - 15.0 g/dL   HCT 04.544.2 40.936.0 - 81.146.0 %   MCV 79.1 78.0 - 100.0 fL   MCH 25.4 (L) 26.0 - 34.0 pg   MCHC 32.1 30.0 - 36.0 g/dL   RDW 91.414.1 78.211.5 - 95.615.5 %   Platelets 173 150 - 400 K/uL   Neutrophils Relative % 68 %   Neutro Abs 2.8 1.7 - 7.7 K/uL   Lymphocytes Relative 20 %   Lymphs Abs 0.8 0.7 - 4.0 K/uL   Monocytes Relative 11 %   Monocytes Absolute 0.5 0.1 - 1.0 K/uL   Eosinophils Relative 0 %    Eosinophils Absolute 0.0 0.0 - 0.7 K/uL   Basophils Relative 1 %   Basophils Absolute 0.0 0.0 - 0.1 K/uL  I-Stat Beta hCG blood, ED ([facility], [facility], [facility] only)  Result Value Ref Range   I-stat hCG, quantitative <5.0 <5 mIU/mL   Comment 3           No results found.  Initial Impression / Assessment and Plan / ED Course  I have reviewed the triage vital signs and the nursing notes.  Pertinent labs & imaging results that were available during my care of the patient were reviewed by me and considered in my medical decision making (see chart for details).     4:40 PM feels much improved after treatment with intravenous hydration with normal saline and with IV Reglan.  She is able to drink water without difficulty .  No longer nauseated.  Received potassium chloride 40 mg p.o. prior to discharge. she was written prescription for Zofran by her primary care physician which she has to pick up at the pharmacy.  I also suggest Imodium for diarrhea as needed.  Avoid dairy.  Encourage oral hydration.  Follow-up with PMD if not continue to improve in 2 or 3 days.  Return if conditions worsen i.e. intractable vomiting, lightheadedness.  Final Clinical Impressions(s) / ED Diagnoses  Diagnoses #1 gastroenteritis #2 mild dehydration #3 hypokalemia Final diagnoses:  None    ED Discharge Orders    None       Doug SouJacubowitz, Jimmye Wisnieski, MD 01/03/18 [phone]

## 2018-01-03 NOTE — Discharge Instructions (Signed)
Make sure that you drink at least six 8 ounce glasses of water or Gatorade each day in order to stay well-hydrated.  Take Imodium as directed for diarrhea.  Avoid milk or foods containing milk such as cheese or ice cream while having diarrhea.  Take the Zofran prescribed you by your doctor as needed for nausea.  If you do not continue to improve in 2 or 3 days, see your doctor.  Return  here if you are unable to hold down fluids after taking the Zofran prescribed, for worsening lightheadedness or if your condition worsens for any reason

## 2018-01-03 NOTE — Patient Instructions (Signed)

## 2019-08-09 ENCOUNTER — Emergency Department (HOSPITAL_COMMUNITY): Payer: BC Managed Care – PPO

## 2019-08-09 ENCOUNTER — Encounter (HOSPITAL_COMMUNITY): Payer: Self-pay | Admitting: Emergency Medicine

## 2019-08-09 ENCOUNTER — Emergency Department (HOSPITAL_COMMUNITY)
Admission: EM | Admit: 2019-08-09 | Discharge: 2019-08-09 | Disposition: A | Discharge status: Home / Self Care | Payer: BC Managed Care – PPO | Attending: Emergency Medicine | Admitting: Emergency Medicine

## 2019-08-09 ENCOUNTER — Other Ambulatory Visit: Payer: Self-pay

## 2019-08-09 DIAGNOSIS — Y93I9 Activity, other involving external motion: Secondary | ICD-10-CM | POA: Diagnosis not present

## 2019-08-09 DIAGNOSIS — Y999 Unspecified external cause status: Secondary | ICD-10-CM | POA: Diagnosis not present

## 2019-08-09 DIAGNOSIS — S199XXA Unspecified injury of neck, initial encounter: Secondary | ICD-10-CM | POA: Diagnosis not present

## 2019-08-09 DIAGNOSIS — M25552 Pain in left hip: Secondary | ICD-10-CM | POA: Diagnosis not present

## 2019-08-09 DIAGNOSIS — S6992XA Unspecified injury of left wrist, hand and finger(s), initial encounter: Secondary | ICD-10-CM | POA: Diagnosis not present

## 2019-08-09 DIAGNOSIS — Y92410 Unspecified street and highway as the place of occurrence of the external cause: Secondary | ICD-10-CM | POA: Diagnosis not present

## 2019-08-09 DIAGNOSIS — R0781 Pleurodynia: Secondary | ICD-10-CM | POA: Diagnosis not present

## 2019-08-09 DIAGNOSIS — S40022A Contusion of left upper arm, initial encounter: Secondary | ICD-10-CM | POA: Diagnosis not present

## 2019-08-09 DIAGNOSIS — S161XXA Strain of muscle, fascia and tendon at neck level, initial encounter: Secondary | ICD-10-CM | POA: Diagnosis not present

## 2019-08-09 DIAGNOSIS — M542 Cervicalgia: Secondary | ICD-10-CM | POA: Diagnosis not present

## 2019-08-09 DIAGNOSIS — S4992XA Unspecified injury of left shoulder and upper arm, initial encounter: Secondary | ICD-10-CM | POA: Diagnosis not present

## 2019-08-09 DIAGNOSIS — S40812A Abrasion of left upper arm, initial encounter: Secondary | ICD-10-CM | POA: Diagnosis not present

## 2019-08-09 DIAGNOSIS — R0789 Other chest pain: Secondary | ICD-10-CM | POA: Diagnosis not present

## 2019-08-09 DIAGNOSIS — S79912A Unspecified injury of left hip, initial encounter: Secondary | ICD-10-CM | POA: Diagnosis not present

## 2019-08-09 DIAGNOSIS — R072 Precordial pain: Secondary | ICD-10-CM | POA: Diagnosis not present

## 2019-08-09 DIAGNOSIS — Z91040 Latex allergy status: Secondary | ICD-10-CM | POA: Diagnosis not present

## 2019-08-09 DIAGNOSIS — M25532 Pain in left wrist: Secondary | ICD-10-CM | POA: Diagnosis not present

## 2019-08-09 DIAGNOSIS — T07XXXA Unspecified multiple injuries, initial encounter: Secondary | ICD-10-CM

## 2019-08-09 DIAGNOSIS — M25512 Pain in left shoulder: Secondary | ICD-10-CM | POA: Diagnosis not present

## 2019-08-09 MED ORDER — CYCLOBENZAPRINE HCL 10 MG PO TABS: 10.0000 mg | ORAL_TABLET | Freq: Three times a day (TID) | ORAL | 0 refills | Status: DC

## 2019-08-09 MED ORDER — BACITRACIN-NEOMYCIN-POLYMYXIN 400-5-5000 EX OINT: TOPICAL_OINTMENT | Freq: Once | CUTANEOUS | Status: DC
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
## 2019-08-09 NOTE — ED Notes (Signed)
Pt given water by PA Marshell Levan
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
## 2019-08-09 NOTE — Discharge Instructions (Addendum)
Your CT scans and x-rays are negative for fracture, or dislocation.  Your examination shows areas of muscle spasm, as well as areas of contusion and an abrasion.  Please cleanse the abrasion of your left arm daily with soap and water, apply Neosporin daily.  Please use Tylenol every 4 hours for soreness.  Please use Flexeril 3 times daily for spasm pain. This medication may cause drowsiness. Please do not drink, drive, or participate in activity that requires concentration while taking this medication.  Please see your primary physician or return to the emergency department if any worsening of your symptoms, changes in your condition, problems, or concerns.

## 2019-08-16 ENCOUNTER — Encounter: Payer: Self-pay | Admitting: Family Medicine

## 2019-09-06 ENCOUNTER — Telehealth: Payer: Self-pay
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
## 2019-09-06 NOTE — Telephone Encounter (Signed)
Please review. You last seen the patient in 2017. Thanks!

## 2019-09-06 NOTE — Telephone Encounter (Signed)
*  the last time you saw the patient was in 2017.

## 2019-09-09 ENCOUNTER — Ambulatory Visit: Payer: BC Managed Care – PPO | Admitting: Family Medicine

## 2019-09-09 ENCOUNTER — Other Ambulatory Visit: Payer: Self-pay

## 2019-09-09 VITALS — BP 131/84 | HR 116 | Temp 96.9°F | Wt 202.0 lb

## 2019-09-09 DIAGNOSIS — R000 Tachycardia, unspecified: Secondary | ICD-10-CM

## 2019-09-09 DIAGNOSIS — R002 Palpitations: Secondary | ICD-10-CM

## 2019-09-09 MED ORDER — METOPROLOL TARTRATE 25 MG PO TABS: 25.0000 mg | ORAL_TABLET | Freq: Two times a day (BID) | ORAL | 1 refills | Status: DC

## 2019-09-09 NOTE — Patient Instructions (Signed)
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
## 2019-09-10 ENCOUNTER — Telehealth: Payer: Self-pay

## 2019-09-10 LAB — COMPREHENSIVE METABOLIC PANEL
ALT: 11 IU/L (ref 0–32)
AST: 17 IU/L (ref 0–40)
Albumin/Globulin Ratio: 1.6 (ref 1.2–2.2)
Albumin: 4.3 g/dL (ref 3.9–5.0)
Alkaline Phosphatase: 94 IU/L (ref 39–117)
BUN/Creatinine Ratio: 15 (ref 9–23)
BUN: 10 mg/dL (ref 6–20)
Bilirubin Total: 0.4 mg/dL (ref 0.0–1.2)
CO2: 20 mmol/L (ref 20–29)
Calcium: 9.3 mg/dL (ref 8.7–10.2)
Chloride: 102 mmol/L (ref 96–106)
Creatinine, Ser: 0.68 mg/dL (ref 0.57–1.00)
GFR calc Af Amer: 142 mL/min/{1.73_m2} (ref >59)
GFR calc non Af Amer: 123 mL/min/{1.73_m2} (ref >59)
Globulin, Total: 2.7 g/dL (ref 1.5–4.5)
Glucose: 104 mg/dL — ABNORMAL HIGH (ref 65–99)
Potassium: 4.1 mmol/L (ref 3.5–5.2)
Sodium: 137 mmol/L (ref 134–144)
Total Protein: 7 g/dL (ref 6.0–8.5)

## 2019-09-10 LAB — CBC
Hematocrit: 42.2 % (ref 34.0–46.6)
Hemoglobin: 14 g/dL (ref 11.1–15.9)
MCH: 26.5 pg — ABNORMAL LOW (ref 26.6–33.0)
MCHC: 33.2 g/dL (ref 31.5–35.7)
MCV: 80 fL (ref 79–97)
Platelets: 231 10*3/uL (ref 150–450)
RBC: 5.28 x10E6/uL (ref 3.77–5.28)
RDW: 12.9 % (ref 11.7–15.4)
WBC: 6.3 10*3/uL (ref 3.4–10.8)

## 2019-09-10 LAB — TSH: TSH: 1.82 u[IU]/mL (ref 0.450–4.500)

## 2019-09-10 NOTE — Telephone Encounter (Signed)
Patient advised as below. Patient was not fasting

## 2019-09-10 NOTE — Telephone Encounter (Signed)
-----   Message from Virginia Crews, MD sent at 09/10/2019 11:05 AM EDT ----- Normal labs. Blood sugar is elevated if patient is fasting, but normal if non-fasting

## 2019-09-10 NOTE — Telephone Encounter (Signed)
LMTCB 09/10/2019  Thanks,   -Lashawne Dura  

## 2019-09-10 NOTE — Telephone Encounter (Signed)
Pt returned call ° °teri °

## 2019-09-13 ENCOUNTER — Ambulatory Visit (INDEPENDENT_AMBULATORY_CARE_PROVIDER_SITE_OTHER): Payer: BC Managed Care – PPO | Admitting: Cardiology

## 2019-09-13 ENCOUNTER — Other Ambulatory Visit: Payer: Self-pay

## 2019-09-13 ENCOUNTER — Encounter: Payer: Self-pay | Admitting: Cardiology

## 2019-09-13 ENCOUNTER — Ambulatory Visit (INDEPENDENT_AMBULATORY_CARE_PROVIDER_SITE_OTHER): Payer: BC Managed Care – PPO

## 2019-09-13 VITALS — BP 130/84 | HR 92 | Temp 97.4°F | Ht 64.0 in | Wt 198.0 lb

## 2019-09-13 DIAGNOSIS — R002 Palpitations: Secondary | ICD-10-CM | POA: Diagnosis not present

## 2019-09-13 NOTE — Patient Instructions (Signed)
Medication Instructions:  Your physician recommends that you continue on your current medications as directed. Please refer to the Current Medication list given to you today.  *If you need a refill on your cardiac medications before your next appointment, please call your pharmacy*  Lab Work: None ordered If you have labs (blood work) drawn today and your tests are completely normal, you will receive your results only by: Marland Kitchen MyChart Message (if you have MyChart) OR . A paper copy in the mail If you have any lab test that is abnormal or we need to change your treatment, we will call you to review the results.  Testing/Procedures: Your physician has recommended that you wear an zio monitor. Zio monitors are medical devices that record the heart's electrical activity. Doctors most often [place] these monitors to diagnose arrhythmias. Arrhythmias are problems with the speed or rhythm of the heartbeat. The monitor is a small, portable device. You can wear one while you do your normal daily activities. This is usually used to diagnose what is causing palpitations/syncope (passing out).    Follow-Up: At [facility], you and your health needs are our priority.  As part of our continuing mission to provide you with exceptional heart care, we have created designated Provider Care Teams.  These Care Teams include your primary Cardiologist (physician) and Advanced Practice Providers (APPs -  Physician Assistants and Nurse Practitioners) who all work together to provide you with the care you need, when you need it.  Your next appointment:   4-6 weeks  The format for your next appointment:   In Person  Provider:    You may see Dr. Garen Lah or one of the following Advanced Practice Providers on your designated Care Team:    Murray Hodgkins, NP  Christell Faith, PA-C  Marrianne Mood, PA-C   Other Instructions Your physician has recommended that you wear a Zio monitor. This monitor is a medical  device that records the heart's electrical activity. Doctors most often use these monitors to diagnose arrhythmias. Arrhythmias are problems with the speed or rhythm of the heartbeat. The monitor is a small device applied to your chest. You can wear one while you do your normal daily activities. While wearing this monitor if you have any symptoms to push the button and record what you felt. Once you have worn this monitor for the period of time provider prescribed (Usually 14 days), you will return the monitor device in the postage paid box. Once it is returned they will download the data collected and provide [place] with a report which the provider will then review and we will call you with those results. Important tips:  1. Avoid showering during the first 24 hours of wearing the monitor. 2. Avoid excessive sweating to help maximize wear time. 3. Do not submerge the device, no hot tubs, and no swimming pools. 4. Keep any lotions or oils away from the patch. 5. After 24 hours you may shower with the patch on. Take brief showers with your back facing the shower head.  6. Do not remove patch once it has been placed because that will interrupt data and decrease adhesive wear time. 7. Push the button when you have any symptoms and write down what you were feeling. 8. Once you have completed wearing your monitor, remove and place into box which has postage paid and place in your outgoing mailbox.  9. If for some reason you have misplaced your box then call our office and we can  provide another box and/or mail it off for you.
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
## 2019-10-02 ENCOUNTER — Other Ambulatory Visit: Payer: Self-pay

## 2019-10-02 ENCOUNTER — Ambulatory Visit (HOSPITAL_COMMUNITY)
Admission: EM | Admit: 2019-10-02 | Discharge: 2019-10-02 | Disposition: A | Discharge status: Home / Self Care | Payer: BC Managed Care – PPO | Attending: Urgent Care | Admitting: Urgent Care

## 2019-10-02 ENCOUNTER — Encounter (HOSPITAL_COMMUNITY): Payer: Self-pay | Admitting: Urgent Care

## 2019-10-02 DIAGNOSIS — R8290 Unspecified abnormal findings in urine: Secondary | ICD-10-CM

## 2019-10-02 DIAGNOSIS — M545 Low back pain, unspecified: Secondary | ICD-10-CM

## 2019-10-02 DIAGNOSIS — Z20828 Contact with and (suspected) exposure to other viral communicable diseases: Secondary | ICD-10-CM | POA: Diagnosis not present

## 2019-10-02 DIAGNOSIS — R0789 Other chest pain: Secondary | ICD-10-CM

## 2019-10-02 DIAGNOSIS — R109 Unspecified abdominal pain: Secondary | ICD-10-CM | POA: Insufficient documentation

## 2019-10-02 DIAGNOSIS — Z20822 Contact with and (suspected) exposure to covid-19: Secondary | ICD-10-CM

## 2019-10-02 DIAGNOSIS — Z3202 Encounter for pregnancy test, result negative: Secondary | ICD-10-CM

## 2019-10-02 LAB — POCT URINALYSIS DIP (DEVICE)
Bilirubin Urine: NEGATIVE
Glucose, UA: NEGATIVE mg/dL
Ketones, ur: NEGATIVE mg/dL
Leukocytes,Ua: NEGATIVE
Nitrite: NEGATIVE
Protein, ur: NEGATIVE mg/dL
Specific Gravity, Urine: 1.01 (ref 1.005–1.030)
Urobilinogen, UA: 0.2 mg/dL (ref 0.0–1.0)
pH: 6.5 (ref 5.0–8.0)

## 2019-10-02 LAB — POC URINE PREG, ED: Preg Test, Ur: NEGATIVE

## 2019-10-02 LAB — POCT PREGNANCY, URINE: Preg Test, Ur: NEGATIVE

## 2019-10-02 MED ORDER — TIZANIDINE HCL 4 MG PO TABS: 4.0000 mg | ORAL_TABLET | Freq: Three times a day (TID) | ORAL | 0 refills | Status: DC | PRN

## 2019-10-02 NOTE — ED Triage Notes (Signed)
Seen by Bess Harvest, pa prior to nursing staff

## 2019-10-02 NOTE — ED Provider Notes (Signed)
[facility]   MRN: [id number] DOB: 03-04-1996  Subjective:   Lisa Grimes is a 23 y.o. female presenting for 1 day history of cute onset malodorous urine, lower back pain that radiates to her bilateral flank sides.  Patient is very concerned that she has a UTI or kidney infection.  She does have a history of UTIs.  She also has a history of chest pain, currently taking metoprolol for this.  She has very close follow-up with her cardiologist.  Denies any Covid exposure but would like to get tested to make sure.  Denies any respiratory symptoms and overall does not feel like she has COVID-19.  She does state that she has had some occasional left lower posterior tooth pain.   No current facility-administered medications for this encounter.   Current Outpatient Medications:  .  metoprolol tartrate (LOPRESSOR) 25 MG tablet, Take 1 tablet (25 mg total) by mouth 2 (two) times daily., Disp: 60 tablet, Rfl: 1   Allergies  Allergen Reactions  . Amoxicillin Hives    Has patient had a PCN reaction causing immediate rash, facial/tongue/throat swelling, SOB or lightheadedness with hypotension: Yes Has patient had a PCN reaction causing severe rash involving mucus membranes or skin necrosis: No Has patient had a PCN reaction that required hospitalization No Has patient had a PCN reaction occurring within the last 10 years: No If all of the above answers are "NO", then may proceed with Cephalosporin use.   . Latex   . Penicillins Hives    Has patient had a PCN reaction causing immediate rash, facial/tongue/throat swelling, SOB or lightheadedness with hypotension: Yes Has patient had a PCN reaction causing severe rash involving mucus membranes or skin necrosis: No Has patient had a PCN reaction that required hospitalization No Has patient had a PCN reaction occurring within the last 10 years: no If all of the above answers are "NO", then may proceed with Cephalosporin use.   . Red Dye    possible allergy    Past Medical History:  Diagnosis Date  . Abnormal white blood cell count   . Acute bronchitis   . Anxiety   . Chlamydia 2016  . Depression with anxiety   . Dye allergic reaction   . Epistaxis   . Fatigue   . Gastroesophageal reflux disease   . Hypoglycemia   . Influenza A   . Insomnia   . Major depressive disorder, recurrent episode, moderate with anxious distress (HCC)   . Menstrual headache   . Migraine headache   . Mild major depression (HCC)   . Palpitations   . Panic disorder with agoraphobia and severe panic attacks   . Parent-child relational problem   . Rhinitis, allergic   . Screening for depression   . UTI (lower urinary tract infection)   . Viral syndrome      Past Surgical History:  Procedure Laterality Date  . EYE SURGERY    . MOUTH SURGERY  2010  . STRABISMUS SURGERY  1997  . TONSILLECTOMY  2003  . tubes in ear  2000    Family History  Problem Relation Age of Onset  . Depression Mother   . Anxiety disorder Mother   . Hypertension Mother   . Alcohol abuse Father   . Hypertension Father   . Kidney disease Father   . Bipolar disorder Maternal Aunt   . Cancer Maternal Aunt        breast  . Hypertension Maternal Aunt   .  Diabetes Maternal Aunt   . Bipolar disorder Maternal Uncle   . Hypertension Maternal Uncle   . Bipolar disorder Paternal Uncle   . Depression Maternal Grandmother   . Stroke Maternal Grandmother   . Hypertension Maternal Grandmother   . Cancer Maternal Grandmother        breast  . Heart disease Maternal Grandmother   . Depression Paternal Grandmother   . Bipolar disorder Paternal Grandmother   . Kidney disease Paternal Grandmother   . ADD / ADHD Cousin   . Depression Cousin   . Heart attack Maternal Aunt   . Heart disease Maternal Aunt   . Hypertension Maternal Aunt   . Heart attack Maternal Uncle   . Heart disease Maternal Uncle   . Diabetes Maternal Uncle   . Stroke Maternal Grandfather   .  Hypertension Maternal Grandfather   . Heart disease Maternal Grandfather     Social History   Tobacco Use  . Smoking status: Never Smoker  . Smokeless tobacco: Never Used  Substance Use Topics  . Alcohol use: No  . Drug use: No    ROS   Objective:   Vitals: BP 124/71 (BP Location: Left Arm)   Pulse 87   Temp 98.2 F (36.8 C) (Oral)   Resp 18   LMP 08/29/2019   SpO2 100%   Pulse oximetry is 100% 84bpm BP is 127/71 on left arm in seated position. 99.72F orally RR 16  LMP 08/29/2019, was irregular but that is normal for patient.  Physical Exam Constitutional:      General: She is not in acute distress.    Appearance: Normal appearance. She is well-developed. She is obese. She is not ill-appearing, toxic-appearing or diaphoretic.  HENT:     Head: Normocephalic and atraumatic.     Right Ear: External ear normal.     Left Ear: External ear normal.     Nose: Nose normal.     Mouth/Throat:     Mouth: Mucous membranes are moist.     Pharynx: Oropharynx is clear.  Eyes:     General: No scleral icterus.    Extraocular Movements: Extraocular movements intact.     Pupils: Pupils are equal, round, and reactive to light.  Cardiovascular:     Rate and Rhythm: Normal rate and regular rhythm.     Heart sounds: Normal heart sounds. No murmur. No friction rub. No gallop.   Pulmonary:     Effort: Pulmonary effort is normal. No respiratory distress.     Breath sounds: Normal breath sounds. No stridor. No wheezing, rhonchi or rales.  Abdominal:     General: Bowel sounds are normal. There is no distension.     Palpations: Abdomen is soft. There is no mass.     Tenderness: There is no abdominal tenderness. There is right CVA tenderness (L>R) and left CVA tenderness (L>R). There is no guarding or rebound.  Musculoskeletal:     Lumbar back: She exhibits tenderness. She exhibits normal range of motion, no bony tenderness, no swelling, no edema and no deformity.       Back:  Skin:     General: Skin is warm and dry.     Coloration: Skin is not pale.     Findings: No rash.  Neurological:     General: No focal deficit present.     Mental Status: She is alert and oriented to person, place, and time.  Psychiatric:        Mood and Affect: Mood  normal.        Behavior: Behavior normal.        Thought Content: Thought content normal.        Judgment: Judgment normal.    Urinalysis did not crossover.  Results significant for trace blood, negative pregnancy test and urinalysis was otherwise unremarkable.  Assessment and Plan :   1. Malodorous urine   2. Bilateral flank pain   3. Acute bilateral low back pain without sciatica   4. Atypical chest pain   5. Exposure to COVID-19 virus     Patient had multiple concerns and different symptoms from different body systems.  Urinalysis was negative for UTI, pyelonephritis, pregnancy.  Physical exam findings not consistent with dental abscess and this was also not patient's chief complaint.  I counseled against a general antibiotic due to patient's general malaise.  Urine culture pending, COVID-19 testing pending.  Recommended general supportive care, use Tylenol and tizanidine for aches and pains, subjective fever.  Contact dental practices for consult, information provided.   Wallis BambergMani, Lawsen Arnott, PA-C 10/02/19 1650

## 2019-10-02 NOTE — Discharge Instructions (Addendum)
You may take 500mg -650mg  Tylenol every 6 hours for pain and inflammation.   [facility] [phone] extension [phone]  Dr. Donn Pierini [phone] [place] [phone] [address] [facility] [place].  [facility] [phone] extension [phone] [place], [place], [address] First come first serve for the first 10 clients.  May do simple extractions only, no wisdom teeth or surgery.  You may try the second for Thursday of the month starting at [employer] You may call the school to see if they are still helping to provide dental care for emergent cases.

## 2019-10-03 ENCOUNTER — Encounter (HOSPITAL_COMMUNITY): Payer: Self-pay

## 2019-10-03 ENCOUNTER — Other Ambulatory Visit: Payer: Self-pay

## 2019-10-03 ENCOUNTER — Emergency Department (HOSPITAL_COMMUNITY)
Admission: EM | Admit: 2019-10-03 | Discharge: 2019-10-03 | Disposition: A | Discharge status: Home / Self Care | Payer: BC Managed Care – PPO | Attending: Emergency Medicine | Admitting: Emergency Medicine

## 2019-10-03 DIAGNOSIS — K0889 Other specified disorders of teeth and supporting structures: Secondary | ICD-10-CM | POA: Insufficient documentation

## 2019-10-03 LAB — NOVEL CORONAVIRUS, NAA (HOSP ORDER, SEND-OUT TO REF LAB; TAT 18-24 HRS): SARS-CoV-2, NAA: NOT DETECTED

## 2019-10-03 LAB — URINE CULTURE: Culture: <10000 — AB

## 2019-10-03 MED ORDER — CLINDAMYCIN HCL 150 MG PO CAPS: 300.0000 mg | ORAL_CAPSULE | Freq: Four times a day (QID) | ORAL | 0 refills | Status: DC

## 2019-10-03 MED ORDER — CLINDAMYCIN HCL 150 MG PO CAPS
300.0000 mg | ORAL_CAPSULE | Freq: Once | ORAL | Status: AC
  Administered 2019-10-03: 300 mg via ORAL
  Filled 2019-10-03: qty 2

## 2019-10-03 MED ORDER — IBUPROFEN 800 MG PO TABS: 800.0000 mg | ORAL_TABLET | Freq: Four times a day (QID) | ORAL | 0 refills | Status: DC | PRN

## 2019-10-03 NOTE — ED Triage Notes (Signed)
Pt has pain to bottom left wisdom tooth, reports swollen lymph nodes, headache, and pain behind ear. Pt has been running low grade fever as well.

## 2019-10-03 NOTE — ED Provider Notes (Signed)
[facility] EMERGENCY DEPARTMENT Provider Note   CSN: [id number] Arrival date & time: 10/03/19  0501     History   Chief Complaint Chief Complaint  Patient presents with  . Dental Pain    HPI Lisa Grimes is a 23 y.o. female.     Patient presents to the emergency department for evaluation of dental pain.  Patient reports that she has problems with her wisdom teeth, has been having pain and swelling on the left lower jaw.  She feels some swelling under the jaw and the neck area as well.  No trouble swallowing.  No drainage.  She has been running a low-grade fever.  She was seen in urgent care yesterday and had a urinalysis that did not suggest infection.  She has not had any cough, chest congestion or other cold symptoms.     Past Medical History:  Diagnosis Date  . Abnormal white blood cell count   . Acute bronchitis   . Anxiety   . Chlamydia 2016  . Depression with anxiety   . Dye allergic reaction   . Epistaxis   . Fatigue   . Gastroesophageal reflux disease   . Hypoglycemia   . Influenza A   . Insomnia   . Major depressive disorder, recurrent episode, moderate with anxious distress ([place])   . Menstrual headache   . Migraine headache   . Mild major depression ([place])   . Palpitations   . Panic disorder with agoraphobia and severe panic attacks   . Parent-child relational problem   . Rhinitis, allergic   . Screening for depression   . UTI (lower urinary tract infection)   . Viral syndrome     Patient Active Problem List   Diagnosis Date Noted  . Preeclampsia, severe, third trimester 06/13/2017  . Gestational hypertension 06/05/2017  . Supervision of high risk pregnancy, antepartum 04/22/2017  . Depression with anxiety 04/22/2017  . UTI (urinary tract infection) during pregnancy, first trimester 04/22/2017  . Anxiety 02/20/2015  . Fatigue 02/20/2015  . Acid reflux 02/20/2015  . Headache, menstrual migraine 02/20/2015  . Depression, major, recurrent,  moderate ([place]) 02/20/2015  . Adaptive colitis 02/20/2015  . Headache, migraine 02/20/2015  . Panic disorder with agoraphobia and severe panic attacks 02/20/2015    Past Surgical History:  Procedure Laterality Date  . EYE SURGERY    . MOUTH SURGERY  2010  . STRABISMUS SURGERY  1997  . TONSILLECTOMY  2003  . tubes in ear  2000     OB History    Gravida  1   Para  1   Term      Preterm  1   AB      Living  1     SAB      TAB      Ectopic      Multiple  0   Live Births  1            Home Medications    Prior to Admission medications   Medication Sig Start Date End Date Taking? Authorizing Provider  clindamycin (CLEOCIN) 150 MG capsule Take 2 capsules (300 mg total) by mouth 4 (four) times daily. 10/03/19   Orpah Greek, MD  ibuprofen (ADVIL) 800 MG tablet Take 1 tablet (800 mg total) by mouth every 6 (six) hours as needed for moderate pain. 10/03/19   Orpah Greek, MD  metoprolol tartrate (LOPRESSOR) 25 MG tablet Take 1 tablet (25 mg total) by mouth 2 (  two) times daily. 09/09/19   Bacigalupo, Marzella SchleinAngela M, MD  tiZANidine (ZANAFLEX) 4 MG tablet Take 1 tablet (4 mg total) by mouth every 8 (eight) hours as needed for muscle spasms. 10/02/19   Wallis BambergMani, Mario, PA-C    Family History Family History  Problem Relation Age of Onset  . Depression Mother   . Anxiety disorder Mother   . Hypertension Mother   . Alcohol abuse Father   . Hypertension Father   . Kidney disease Father   . Bipolar disorder Maternal Aunt   . Cancer Maternal Aunt        breast  . Hypertension Maternal Aunt   . Diabetes Maternal Aunt   . Bipolar disorder Maternal Uncle   . Hypertension Maternal Uncle   . Bipolar disorder Paternal Uncle   . Depression Maternal Grandmother   . Stroke Maternal Grandmother   . Hypertension Maternal Grandmother   . Cancer Maternal Grandmother        breast  . Heart disease Maternal Grandmother   . Depression Paternal Grandmother   .  Bipolar disorder Paternal Grandmother   . Kidney disease Paternal Grandmother   . ADD / ADHD Cousin   . Depression Cousin   . Heart attack Maternal Aunt   . Heart disease Maternal Aunt   . Hypertension Maternal Aunt   . Heart attack Maternal Uncle   . Heart disease Maternal Uncle   . Diabetes Maternal Uncle   . Stroke Maternal Grandfather   . Hypertension Maternal Grandfather   . Heart disease Maternal Grandfather     Social History Social History   Tobacco Use  . Smoking status: Never Smoker  . Smokeless tobacco: Never Used  Substance Use Topics  . Alcohol use: No  . Drug use: No     Allergies   Amoxicillin, Latex, Penicillins, and Red dye   Review of Systems Review of Systems  Constitutional: Positive for fever.  HENT: Positive for dental problem.   All other systems reviewed and are negative.    Physical Exam Updated Vital Signs BP (!) 120/59 (BP Location: Right Arm)   Pulse 80   Temp 99.3 F (37.4 C) (Oral)   Resp 17   Ht 5\' 4"  (1.626 m)   Wt 88.9 kg   SpO2 100%   BMI 33.64 kg/m   Physical Exam Vitals signs and nursing note reviewed.  Constitutional:      General: She is not in acute distress.    Appearance: Normal appearance. She is well-developed.  HENT:     Head: Normocephalic and atraumatic.     Jaw: There is normal jaw occlusion.     Right Ear: Hearing normal.     Left Ear: Hearing normal.     Nose: Nose normal.     Mouth/Throat:   Eyes:     Conjunctiva/sclera: Conjunctivae normal.     Pupils: Pupils are equal, round, and reactive to light.  Neck:     Musculoskeletal: Normal range of motion and neck supple.  Cardiovascular:     Rate and Rhythm: Regular rhythm.     Heart sounds: S1 normal and S2 normal. No murmur. No friction rub. No gallop.   Pulmonary:     Effort: Pulmonary effort is normal. No respiratory distress.     Breath sounds: Normal breath sounds.  Chest:     Chest wall: No tenderness.  Abdominal:     General: Bowel  sounds are normal.     Palpations: Abdomen is soft.  Tenderness: There is no abdominal tenderness. There is no guarding or rebound. Negative signs include Murphy's sign and McBurney's sign.     Hernia: No hernia is present.  Musculoskeletal: Normal range of motion.  Skin:    General: Skin is warm and dry.     Findings: No rash.  Neurological:     Mental Status: She is alert and oriented to person, place, and time.     GCS: GCS eye subscore is 4. GCS verbal subscore is 5. GCS motor subscore is 6.     Cranial Nerves: No cranial nerve deficit.     Sensory: No sensory deficit.     Coordination: Coordination normal.  Psychiatric:        Speech: Speech normal.        Behavior: Behavior normal.        Thought Content: Thought content normal.      ED Treatments / Results  Labs (all labs ordered are listed, but only abnormal results are displayed) Labs Reviewed - No data to display  EKG None  Radiology No results found.  Procedures Procedures (including critical care time)  Medications Ordered in ED Medications  clindamycin (CLEOCIN) capsule 300 mg (has no administration in time range)     Initial Impression / Assessment and Plan / ED Course  I have reviewed the triage vital signs and the nursing notes.  Pertinent labs & imaging results that were available during my care of the patient were reviewed by me and considered in my medical decision making (see chart for details).        Patient presents with dental pain.  She does have some gingival swelling and maceration around the area of pain that she is experiencing.  No obvious cavities and there is no obvious dental abscess.  Patient has been experiencing a low-grade fever.  She has had a Covid exposure.  She had Covid testing performed yesterday in urgent care, still pending.  Urinalysis yesterday did not suggest infection.  Patient not experiencing upper respiratory infection symptoms, lungs are clear, no clinical  concern for pneumonia.  Abdominal exam is benign.  She appears well, no signs of toxicity/sepsis, etc.  Will empirically cover for possible dental infection, follow-up with dentist.  Final Clinical Impressions(s) / ED Diagnoses   Final diagnoses:  Pain, dental    ED Discharge Orders         Ordered    clindamycin (CLEOCIN) 150 MG capsule  4 times daily     10/03/19 0623    ibuprofen (ADVIL) 800 MG tablet  Every 6 hours PRN     10/03/19 0623           Gilda Crease, MD 10/03/19 [phone]

## 2019-10-05 DIAGNOSIS — R002 Palpitations: Secondary | ICD-10-CM | POA: Diagnosis not present

## 2019-10-12 ENCOUNTER — Other Ambulatory Visit: Payer: Self-pay

## 2019-10-12 ENCOUNTER — Ambulatory Visit (INDEPENDENT_AMBULATORY_CARE_PROVIDER_SITE_OTHER): Payer: BC Managed Care – PPO | Admitting: Cardiology

## 2019-10-12 ENCOUNTER — Encounter: Payer: Self-pay | Admitting: Cardiology

## 2019-10-12 VITALS — BP 130/70 | HR 87 | Ht 64.0 in | Wt 203.0 lb

## 2019-10-12 DIAGNOSIS — R002 Palpitations: Secondary | ICD-10-CM

## 2019-10-12 NOTE — Patient Instructions (Addendum)
Medication Instructions:  Your physician recommends that you continue on your current medications as directed. Please refer to the Current Medication list given to you today.  *If you need a refill on your cardiac medications before your next appointment, please call your pharmacy*  Lab Work: None ordered If you have labs (blood work) drawn today and your tests are completely normal, you will receive your results only by: Marland Kitchen MyChart Message (if you have MyChart) OR . A paper copy in the mail If you have any lab test that is abnormal or we need to change your treatment, we will call you to review the results.  Testing/Procedures: None ordered  Follow-Up: At [facility], you and your health needs are our priority.  As part of our continuing mission to provide you with exceptional heart care, we have created designated Provider Care Teams.  These Care Teams include your primary Cardiologist (physician) and Advanced Practice Providers (APPs -  Physician Assistants and Nurse Practitioners) who all work together to provide you with the care you need, when you need it.  Your next appointment:   Follow after EP consult   The format for your next appointment:   In Person  Provider:    You may see  Dr. Mylo Red- Charlestine Night or one of the following Advanced Practice Providers on your designated Care Team:    Murray Hodgkins, NP  Christell Faith, PA-C  Marrianne Mood, PA-C  You have been referred to EP Dr. Caryl Comes   Other Instructions N/A
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
## 2019-10-15 ENCOUNTER — Other Ambulatory Visit: Payer: Self-pay

## 2019-10-15 DIAGNOSIS — Z20822 Contact with and (suspected) exposure to covid-19: Secondary | ICD-10-CM

## 2019-10-17 LAB — NOVEL CORONAVIRUS, NAA: SARS-CoV-2, NAA: NOT DETECTED

## 2019-10-18 ENCOUNTER — Telehealth: Payer: Self-pay | Admitting: Internal Medicine

## 2019-10-18 ENCOUNTER — Other Ambulatory Visit: Payer: Self-pay

## 2019-10-18 ENCOUNTER — Ambulatory Visit
Admission: EM | Admit: 2019-10-18 | Discharge: 2019-10-18 | Disposition: A | Discharge status: Home / Self Care | Payer: BC Managed Care – PPO | Attending: Emergency Medicine | Admitting: Emergency Medicine

## 2019-10-18 DIAGNOSIS — K0889 Other specified disorders of teeth and supporting structures: Secondary | ICD-10-CM | POA: Diagnosis not present

## 2019-10-18 DIAGNOSIS — K047 Periapical abscess without sinus: Secondary | ICD-10-CM

## 2019-10-18 MED ORDER — CLINDAMYCIN HCL 150 MG PO CAPS: 300.0000 mg | ORAL_CAPSULE | Freq: Four times a day (QID) | ORAL | 0 refills | Status: DC

## 2019-10-18 NOTE — Telephone Encounter (Signed)
° ° °  COVID-19 Pre-Screening Questions:   In the past 7 to 10 days have you had a cough,  shortness of breath, headache, congestion, fever (100 or greater) body aches, chills, sore throat, or sudden loss of taste or sense of smell? YES  Have you been around anyone with known Covid 19.  YES EXPOSURE THANKSGIVING AND HUSBAND POSITIVE   Have you been around anyone who is awaiting Covid 19 test results in the past 7 to 10 days? YES HOUSEHOLD TESTED SELF NEGATIVE AND NO SYMPTOMS FOR 2 DAYS HUSBAND POSITIVE   Have you been around anyone who has been exposed to Covid 19, or has mentioned symptoms of Covid 19 within the past 7 to 10 days? YES  If you have any concerns/questions about symptoms patients report during screening (either on the phone or at threshold). Contact the provider seeing the patient or DOD for further guidance.  If neither are available contact a member of the leadership team.  CAN PATIENT DO A VIRTUAL OR RESCHEDULE ?

## 2019-10-18 NOTE — ED Triage Notes (Signed)
Pt presents to UC w/ c/o tooth infection in lower left side since this morning.  Pt states she had a tooth infection 2 weeks ago which she took antibiotics for and healed. Pt states she feels that the infection is coming back.

## 2019-10-18 NOTE — Discharge Instructions (Signed)
Continue with ibuprofen or tylenol as needed for pain Antibiotic prescribed.  Take as directed and to completion Recommend soft diet until evaluated by dentist Maintain oral hygiene care Follow up with dentist as soon as possible for further evaluation and treatment  Return or go to the ED if you have any new or worsening symptoms such as fever, chills, difficulty swallowing, painful swallowing, oral or neck swelling, nausea, vomiting, chest pain, SOB, etc..Marland Kitchen

## 2019-10-18 NOTE — ED Provider Notes (Signed)
[place]   [id number] 10/18/19 Arrival Time: 3086  CC: DENTAL PAIN  SUBJECTIVE:  Lisa Grimes is a 23 y.o. female who presents with dental pain x few days.  Denies a precipitating event or trauma.  Localizes pain to bottom left.  Describes pain as intermittent and achy.  Has tried OTC analgesics with relief.  Worse with chewing.  Does not see a dentist regularly.  Reports similar symptoms and treated for dental infection 2 weeks ago.  Denies fever, chills, dysphagia, odynophagia, oral or neck swelling, nausea, vomiting, chest pain, SOB.    Patient's husband tested positive for COVID 3 days ago.    ROS: As per HPI.  All other pertinent ROS negative.     Past Medical History:  Diagnosis Date  . Abnormal white blood cell count   . Acute bronchitis   . Anxiety   . Chlamydia 2016  . Depression with anxiety   . Dye allergic reaction   . Epistaxis   . Fatigue   . Gastroesophageal reflux disease   . Hypoglycemia   . Influenza A   . Insomnia   . Major depressive disorder, recurrent episode, moderate with anxious distress ([place])   . Menstrual headache   . Migraine headache   . Mild major depression ([place])   . Palpitations   . Panic disorder with agoraphobia and severe panic attacks   . Parent-child relational problem   . Rhinitis, allergic   . Screening for depression   . UTI (lower urinary tract infection)   . Viral syndrome    Past Surgical History:  Procedure Laterality Date  . EYE SURGERY    . MOUTH SURGERY  2010  . STRABISMUS SURGERY  1997  . TONSILLECTOMY  2003  . tubes in ear  2000   Allergies  Allergen Reactions  . Amoxicillin Hives    Has patient had a PCN reaction causing immediate rash, facial/tongue/throat swelling, SOB or lightheadedness with hypotension: Yes Has patient had a PCN reaction causing severe rash involving mucus membranes or skin necrosis: No Has patient had a PCN reaction that required hospitalization No Has patient had a PCN  reaction occurring within the last 10 years: No If all of the above answers are "NO", then may proceed with Cephalosporin use.   . Latex   . Penicillins Hives    Has patient had a PCN reaction causing immediate rash, facial/tongue/throat swelling, SOB or lightheadedness with hypotension: Yes Has patient had a PCN reaction causing severe rash involving mucus membranes or skin necrosis: No Has patient had a PCN reaction that required hospitalization No Has patient had a PCN reaction occurring within the last 10 years: no If all of the above answers are "NO", then may proceed with Cephalosporin use.   . Red Dye     possible allergy   No current facility-administered medications on file prior to encounter.    Current Outpatient Medications on File Prior to Encounter  Medication Sig Dispense Refill  . ibuprofen (ADVIL) 800 MG tablet Take 1 tablet (800 mg total) by mouth every 6 (six) hours as needed for moderate pain. 20 tablet 0  . metoprolol tartrate (LOPRESSOR) 25 MG tablet Take 1 tablet (25 mg total) by mouth 2 (two) times daily. 60 tablet 1   Social History   Socioeconomic History  . Marital status: Married    Spouse name: Not on file  . Number of children: Not on file  . Years of education: Not on file  .  Highest education level: Not on file  Occupational History  . Not on file  Social Needs  . Financial resource strain: Not on file  . Food insecurity    Worry: Not on file    Inability: Not on file  . Transportation needs    Medical: Not on file    Non-medical: Not on file  Tobacco Use  . Smoking status: Never Smoker  . Smokeless tobacco: Never Used  Substance and Sexual Activity  . Alcohol use: No  . Drug use: No  . Sexual activity: Yes    Birth control/protection: None  Lifestyle  . Physical activity    Days per week: Not on file    Minutes per session: Not on file  . Stress: Not on file  Relationships  . Social Musicianconnections    Talks on phone: Not on file     Gets together: Not on file    Attends religious service: Not on file    Active member of club or organization: Not on file    Attends meetings of clubs or organizations: Not on file    Relationship status: Not on file  . Intimate partner violence    Fear of current or ex partner: Not on file    Emotionally abused: Not on file    Physically abused: Not on file    Forced sexual activity: Not on file  Other Topics Concern  . Not on file  Social History Narrative  . Not on file   Family History  Problem Relation Age of Onset  . Depression Mother   . Anxiety disorder Mother   . Hypertension Mother   . Alcohol abuse Father   . Hypertension Father   . Kidney disease Father   . Bipolar disorder Maternal Aunt   . Cancer Maternal Aunt        breast  . Hypertension Maternal Aunt   . Diabetes Maternal Aunt   . Bipolar disorder Maternal Uncle   . Hypertension Maternal Uncle   . Bipolar disorder Paternal Uncle   . Depression Maternal Grandmother   . Stroke Maternal Grandmother   . Hypertension Maternal Grandmother   . Cancer Maternal Grandmother        breast  . Heart disease Maternal Grandmother   . Depression Paternal Grandmother   . Bipolar disorder Paternal Grandmother   . Kidney disease Paternal Grandmother   . ADD / ADHD Cousin   . Depression Cousin   . Heart attack Maternal Aunt   . Heart disease Maternal Aunt   . Hypertension Maternal Aunt   . Heart attack Maternal Uncle   . Heart disease Maternal Uncle   . Diabetes Maternal Uncle   . Stroke Maternal Grandfather   . Hypertension Maternal Grandfather   . Heart disease Maternal Grandfather     OBJECTIVE:  Vitals:   10/18/19 0835  BP: 121/82  Pulse: 86  Resp: 16  Temp: 98.5 F (36.9 C)  TempSrc: Oral  SpO2: 98%    General appearance: alert; no distress HENT: normocephalic; atraumatic; EACs clear, TMs pearly gray; nares patent without rhinorrhea; dentition: fair; good dentition over left lower gums without  areas of fluctuance Neck: supple without LAD Lungs: normal respirations; CTAB CV: RRR Skin: warm and dry Psychological: alert and cooperative; normal mood and affect  ASSESSMENT & PLAN:  1. Pain, dental   2. Dental infection     Meds ordered this encounter  Medications  . clindamycin (CLEOCIN) 150 MG capsule  Sig: Take 2 capsules (300 mg total) by mouth 4 (four) times daily.    Dispense:  80 capsule    Refill:  0    Order Specific Question:   Supervising Provider    Answer:   Eustace Moore [[id number]]   Continue with ibuprofen or tylenol as needed for pain Antibiotic prescribed.  Take as directed and to completion Recommend soft diet until evaluated by dentist Maintain oral hygiene care Follow up with dentist as soon as possible for further evaluation and treatment  Return or go to the ED if you have any new or worsening symptoms such as fever, chills, difficulty swallowing, painful swallowing, oral or neck swelling, nausea, vomiting, chest pain, SOB, etc...  Reviewed expectations re: course of current medical issues. Questions answered. Outlined signs and symptoms indicating need for more acute intervention. Patient verbalized understanding. After Visit Summary given.   Rennis Harding, PA-C 10/18/19 [phone]

## 2019-10-19 NOTE — Telephone Encounter (Signed)
Spoke with Patient .  She rescheduled for Virtual and is aware to do vs regime below.

## 2019-10-19 NOTE — Telephone Encounter (Signed)
Per Dr. Caryl Comes- will need to do a virtual visit with him this week- if she is not good with a virtual visit, then she will need to r/s her appointment.  If she is ok with a virtual appt this week, Dr. Caryl Comes asked that, if she has an automatic BP cuff, they she check orthostatic HR/ BP readings the morning of her visit.  She will need to lay flat for 10 minutes, then 1) take a lying BP/ HR 2) sit up and immediately take another BP/ HR reading 3) stand up and immediately take another BP/ HR reading 4) continue to stand for 3 minutes and take another BP/ HR reading  Record the above for her visit with him.   If she has no BP/ HR monitor at home, then still ok for a virtual visit.   Thanks!

## 2019-10-20 ENCOUNTER — Ambulatory Visit: Payer: BC Managed Care – PPO | Admitting: Internal Medicine

## 2019-10-22 ENCOUNTER — Other Ambulatory Visit: Payer: Self-pay

## 2019-10-22 ENCOUNTER — Telehealth (INDEPENDENT_AMBULATORY_CARE_PROVIDER_SITE_OTHER): Payer: BC Managed Care – PPO | Admitting: Internal Medicine

## 2019-10-22 VITALS — BP 120/77 | HR 91 | Ht 64.0 in | Wt 196.0 lb

## 2019-10-22 DIAGNOSIS — R002 Palpitations: Secondary | ICD-10-CM

## 2019-10-22 NOTE — Progress Notes (Signed)
Electrophysiology TeleHealth Note   Due to national recommendations of social distancing due to COVID 19, an audio/video telehealth visit is felt to be most appropriate for this patient at this time.  See MyChart message from today for the patient's consent to telehealth for [facility].   Date:  10/22/2019   ID:  Lisa Grimes, DOB 17-Apr-1996, MRN [id number]  Location: patient's home  Provider location: [address], [place]  Evaluation Performed: Initial Evaluation  PCP:  Maple Hudson., MD  Cardiologist:  BAE Electrophysiologist:  None   Chief Complaint:  palpitations  History of Present Illness:    Lisa Grimes is a 23 y.o. female who presents via audio/video conferencing for a telehealth visit today for  palpitations of minutes to hours duration.    Has long hx of same, having been seen originally ( in our system) 2016 by J[facility] ( See Below)  Events occur at various times of day, mostly in the am  No assoc with caffeine; stress Is constant  Event Recorder personnally reviewed 2020   121 triggered events  Almost all are assoc with sinus rhythm--there is some evidence of sinus arrhythmia, rare ectopic atrial (slow) rate, some sinus tach Strips with significant RR variability--rate changes   Event Recorder personnally reviewed  2016 sinus rhythm      Hx notable for anxiety and depression   The patient denies symptoms of fevers, chills, cough, or new SOB worrisome for COVID 19.    Past Medical History:  Diagnosis Date  . Abnormal white blood cell count   . Acute bronchitis   . Anxiety   . Chlamydia 2016  . Depression with anxiety   . Dye allergic reaction   . Epistaxis   . Fatigue   . Gastroesophageal reflux disease   . Hypoglycemia   . Influenza A   . Insomnia   . Major depressive disorder, recurrent episode, moderate with anxious distress ([facility])   . Menstrual headache   . Migraine headache   . Mild major depression ([facility])     . Palpitations   . Panic disorder with agoraphobia and severe panic attacks   . Parent-child relational problem   . Rhinitis, allergic   . Screening for depression   . UTI (lower urinary tract infection)   . Viral syndrome     Past Surgical History:  Procedure Laterality Date  . EYE SURGERY    . MOUTH SURGERY  2010  . STRABISMUS SURGERY  1997  . TONSILLECTOMY  2003  . tubes in ear  2000    Current Outpatient Medications  Medication Sig Dispense Refill  . clindamycin (CLEOCIN) 150 MG capsule Take 2 capsules (300 mg total) by mouth 4 (four) times daily. 80 capsule 0  . metoprolol tartrate (LOPRESSOR) 25 MG tablet Take 1 tablet (25 mg total) by mouth 2 (two) times daily. 60 tablet 1   No current facility-administered medications for this visit.    Allergies:   Amoxicillin, Latex, Penicillins, and Red dye   Social History:  The patient  reports that she has never smoked. She has never used smokeless tobacco. She reports that she does not drink alcohol or use drugs.   Family History:  The patient's   family history includes ADD / ADHD in her cousin; Alcohol abuse in her father; Anxiety disorder in her mother; Bipolar disorder in her maternal aunt, maternal uncle, paternal grandmother, and paternal uncle; Cancer in her maternal aunt and maternal  grandmother; Depression in her cousin, maternal grandmother, mother, and paternal grandmother; Diabetes in her maternal aunt and maternal uncle; Heart attack in her maternal aunt and maternal uncle; Heart disease in her maternal aunt, maternal grandfather, maternal grandmother, and maternal uncle; Hypertension in her father, maternal aunt, maternal aunt, maternal grandfather, maternal grandmother, maternal uncle, and mother; Kidney disease in her father and paternal grandmother; Stroke in her maternal grandfather and maternal grandmother.   ROS:  Please see the history of present illness.   All other systems are personally reviewed and negative.     Exam:    Vital Signs:  BP 120/77 (Patient Position: Standing) Comment (Patient Position): standing for 3 mins  Pulse 91   Ht 5\' 4"  (1.626 m)   Wt 196 lb (88.9 kg)   BMI 33.64 kg/m     Labs/Other Tests and Data Reviewed:    Recent Labs: 09/09/2019: ALT 11; BUN 10; Creatinine, Ser 0.68; Hemoglobin 14.0; Platelets 231; Potassium 4.1; Sodium 137; TSH 1.820   Wt Readings from Last 3 Encounters:  10/22/19 196 lb (88.9 kg)  10/12/19 203 lb (92.1 kg)  10/03/19 196 lb (88.9 kg)     Other studies personally reviewed: Additional studies/ records that were reviewed today include: .notes   Review of the above records today demonstrates:  Prior radiographs:      ASSESSMENT & PLAN:    Tachypalpitations  Anxiety     No significant arrhythmia noted.  There is variation in rate with stable p wave morphology and without any discontinuity making atrial tach unlikely and given her young age, I suspect this is exaggerated sinus arrhythmia and further complicated by a hypersensitivity to HR changes  The metoprolol has been helpful and recommended that she continue taking it.   She asked why she should take something if there"is nothing wrong"and I told her if it helps with symptoms then reasonable.;  The medicine is safe   COVID 19 screen The patient denies symptoms of COVID 19 at this time.  The importance of social distancing was discussed today.  Follow-up:  prn   Current medicines are reviewed at length with the patient today.   The patient  concerns regarding her medicines.  The following changes were made today:    Labs/ tests ordered today include:   No orders of the defined types were placed in this encounter.   Future tests ( post COVID )     Patient Risk:  after full review of this patients clinical status, I feel that they are at moderate risk at this time.  Today, I have spent 17 minutes with the patient with telehealth technology discussing   .    Signed, Virl Axe, MD  10/22/2019 12:01 PM     [address] [place] [address] [phone] (office) [phone] (fax)

## 2019-10-27 NOTE — Patient Instructions (Signed)
Medication Instructions:  - Your physician recommends that you continue on your current medications as directed. Please refer to the Current Medication list given to you today.  *If you need a refill on your cardiac medications before your next appointment, please call your pharmacy*  Lab Work: - none ordered  If you have labs (blood work) drawn today and your tests are completely normal, you will receive your results only by: . MyChart Message (if you have MyChart) OR . A paper copy in the mail If you have any lab test that is abnormal or we need to change your treatment, we will call you to review the results.  Testing/Procedures: - none ordered  Follow-Up: At [facility], you and your health needs are our priority.  As part of our continuing mission to provide you with exceptional heart care, we have created designated Provider Care Teams.  These Care Teams include your primary Cardiologist (physician) and Advanced Practice Providers (APPs -  Physician Assistants and Nurse Practitioners) who all work together to provide you with the care you need, when you need it.  Your next appointment:   As needed   The format for your next appointment:   n/a  Provider:   Steven Klein, MD  Other Instructions n/a  

## 2019-11-01 ENCOUNTER — Telehealth: Payer: Self-pay | Admitting: *Deleted

## 2019-11-01 DIAGNOSIS — R000 Tachycardia, unspecified: Secondary | ICD-10-CM

## 2019-11-01 DIAGNOSIS — R002 Palpitations: Secondary | ICD-10-CM

## 2019-11-01 NOTE — Telephone Encounter (Signed)
Will she be switching from [facility] to [facility]?  She does not need 2 cardiologists.  If she is switching, we can place the referral as requested.

## 2019-11-01 NOTE — Telephone Encounter (Addendum)
Called patient for clarification. Patient is requesting a referral to a different cardiologist. [facility] [phone]. Patient is still seeing [facility]. Advised patient patient Dr. Rosanna Randy is out of the office until next week. Patient then requested message be sent to Dr. Brita Romp sine she did the original referral. Please advise referral? Patient also advised to seek ED if symptoms worsen/

## 2019-11-01 NOTE — Telephone Encounter (Signed)
Source Subject Topic  Lisa Grimes (Patient) Lisa Grimes (Patient) Referral - Request for Referral  Has patient seen PCP for this complaint? Yes.   *If NO, is insurance requiring patient see PCP for this issue before PCP can refer them?  Referral for which specialty: Cardiology   Preferred provider/office: [facility]/ [place]/ [phone]   Reason for referral: Pt states she is having tachycardia issues. Please advise.

## 2019-11-04 ENCOUNTER — Other Ambulatory Visit: Payer: Self-pay | Admitting: Family Medicine

## 2019-11-04 MED ORDER — METOPROLOL TARTRATE 25 MG PO TABS: 25.0000 mg | ORAL_TABLET | Freq: Two times a day (BID) | ORAL | 1 refills | Status: DC

## 2019-11-04 NOTE — Telephone Encounter (Signed)
[facility] faxed refill request for the following medications:   metoprolol tartrate (LOPRESSOR) 25 MG tablet   Please advise.  Thanks, [employer]

## 2019-11-08 ENCOUNTER — Encounter: Payer: Self-pay | Admitting: Cardiology

## 2019-11-08 ENCOUNTER — Other Ambulatory Visit: Payer: Self-pay

## 2019-11-08 ENCOUNTER — Ambulatory Visit (INDEPENDENT_AMBULATORY_CARE_PROVIDER_SITE_OTHER): Payer: BC Managed Care – PPO | Admitting: Cardiology

## 2019-11-08 VITALS — BP 120/70 | HR 90 | Ht 64.0 in | Wt 204.0 lb

## 2019-11-08 DIAGNOSIS — R002 Palpitations: Secondary | ICD-10-CM | POA: Diagnosis not present

## 2019-11-08 MED ORDER — METOPROLOL SUCCINATE ER 25 MG PO TB24: 25.0000 mg | ORAL_TABLET | Freq: Two times a day (BID) | ORAL | 5 refills | Status: DC

## 2019-11-08 NOTE — Progress Notes (Signed)
Cardiology Office Note:    Date:  11/08/2019   ID:  BONETA STANDRE, DOB 06-30-1996, MRN [id number]  PCP:  Maple Hudson., MD  Cardiologist:  No primary care provider on file.  Electrophysiologist:  None   Referring MD: Maple Hudson.,*   Chief Complaint  Patient presents with  . other    Pt f/u Pt has no complaints. Meds verbally reviewed w/ pt.     History of Present Illness:    Lisa Grimes is a 23 y.o. female with a hx of anxiety, who presents for follow-up.  She was originally seen due to symptoms of palpitations.    Patient states having irregular heartbeats every now and then and also increased heart rates. Symptoms can happen for 2 to 3 days in a row then she can go for a month without anything happening.  Symptoms can last anywhere from minutes to a full hour when they occur. Symptoms are sometimes associated with feeling of fullness in her chest,occasional dizziness, blurred vision. She did not check her blood pressure with a heart rate.  She had video recordings of her heart rates at home showing 150s.   Patient was then started on Lopressor 25 mg twice daily.  She states taking the medication has improved her symptoms somewhat.  She saw EP and her symptoms may be secondary to sinus arrhythmias.  Her symptoms seem to worsen a couple of hours after she has taken her beta-blocker.  .    Past Medical History:  Diagnosis Date  . Abnormal white blood cell count   . Acute bronchitis   . Anxiety   . Chlamydia 2016  . Depression with anxiety   . Dye allergic reaction   . Epistaxis   . Fatigue   . Gastroesophageal reflux disease   . Hypoglycemia   . Influenza A   . Insomnia   . Major depressive disorder, recurrent episode, moderate with anxious distress (HCC)   . Menstrual headache   . Migraine headache   . Mild major depression (HCC)   . Palpitations   . Panic disorder with agoraphobia and severe panic attacks   . Parent-child relational problem     . Rhinitis, allergic   . Screening for depression   . UTI (lower urinary tract infection)   . Viral syndrome     Past Surgical History:  Procedure Laterality Date  . EYE SURGERY    . MOUTH SURGERY  2010  . STRABISMUS SURGERY  1997  . TONSILLECTOMY  2003  . tubes in ear  2000    Current Medications: Current Meds  Medication Sig  . [DISCONTINUED] metoprolol tartrate (LOPRESSOR) 25 MG tablet Take 1 tablet (25 mg total) by mouth 2 (two) times daily.     Allergies:   Amoxicillin, Latex, Penicillins, and Red dye   Social History   Socioeconomic History  . Marital status: Married    Spouse name: Not on file  . Number of children: Not on file  . Years of education: Not on file  . Highest education level: Not on file  Occupational History  . Not on file  Tobacco Use  . Smoking status: Never Smoker  . Smokeless tobacco: Never Used  Substance and Sexual Activity  . Alcohol use: No  . Drug use: No  . Sexual activity: Yes    Birth control/protection: None  Other Topics Concern  . Not on file  Social History Narrative  . Not on file  Social Determinants of Health   Financial Resource Strain:   . Difficulty of Paying Living Expenses: Not on file  Food Insecurity:   . Worried About Programme researcher, broadcasting/film/videounning Out of Food in the Last Year: Not on file  . Ran Out of Food in the Last Year: Not on file  Transportation Needs:   . Lack of Transportation (Medical): Not on file  . Lack of Transportation (Non-Medical): Not on file  Physical Activity:   . Days of Exercise per Week: Not on file  . Minutes of Exercise per Session: Not on file  Stress:   . Feeling of Stress : Not on file  Social Connections:   . Frequency of Communication with Friends and Family: Not on file  . Frequency of Social Gatherings with Friends and Family: Not on file  . Attends Religious Services: Not on file  . Active Member of Clubs or Organizations: Not on file  . Attends BankerClub or Organization Meetings: Not on file   . Marital Status: Not on file     Family History: The patient's family history includes ADD / ADHD in her cousin; Alcohol abuse in her father; Anxiety disorder in her mother; Bipolar disorder in her maternal aunt, maternal uncle, paternal grandmother, and paternal uncle; Cancer in her maternal aunt and maternal grandmother; Depression in her cousin, maternal grandmother, mother, and paternal grandmother; Diabetes in her maternal aunt and maternal uncle; Heart attack in her maternal aunt and maternal uncle; Heart disease in her maternal aunt, maternal grandfather, maternal grandmother, and maternal uncle; Hypertension in her father, maternal aunt, maternal aunt, maternal grandfather, maternal grandmother, maternal uncle, and mother; Kidney disease in her father and paternal grandmother; Stroke in her maternal grandfather and maternal grandmother.  ROS:   Please see the history of present illness.     All other systems reviewed and are negative.  EKGs/Labs/Other Studies Reviewed:    The following studies were reviewed today: 2-week cardiac monitor date 10/06/2019 Patient had a min HR of 43 bpm, max HR of 176 bpm, and avg HR of 76 bpm. Predominant underlying rhythm was Sinus Rhythm. Ectopic Atrial Rhythm was present. Isolated SVEs were rare (<1.0%), SVE Couplets were rare (<1.0%), and no SVE Triplets were present. Isolated VEs were rare (<1.0%), and no VE Couplets or VE Triplets were present.  Patient triggered events/symptoms were associated with sinus rhythm.  EKG:  EKG is  ordered today.  The ekg ordered today demonstrates normal sinus rhythm, nonspecific T wave changes  Recent Labs: 09/09/2019: ALT 11; BUN 10; Creatinine, Ser 0.68; Hemoglobin 14.0; Platelets 231; Potassium 4.1; Sodium 137; TSH 1.820  Recent Lipid Panel No results found for: CHOL, TRIG, HDL, CHOLHDL, VLDL, LDLCALC, LDLDIRECT  Physical Exam:    VS:  BP 120/70 (BP Location: Left Arm, Patient Position: Sitting, Cuff  Size: Normal)   Pulse 90   Ht 5\' 4"  (1.626 m)   Wt 204 lb (92.5 kg)   SpO2 98%   BMI 35.02 kg/m     Wt Readings from Last 3 Encounters:  11/08/19 204 lb (92.5 kg)  10/22/19 196 lb (88.9 kg)  10/12/19 203 lb (92.1 kg)     GEN:  Well nourished, well developed in no acute distress HEENT: Normal NECK: No JVD; No carotid bruits LYMPHATICS: No lymphadenopathy CARDIAC: RRR, no murmurs, rubs, gallops RESPIRATORY:  Clear to auscultation without rales, wheezing or rhonchi  ABDOMEN: Soft, non-tender, non-distended MUSCULOSKELETAL:  No edema; No deformity, acrocyanosis in fingernail bed. SKIN: Warm and dry NEUROLOGIC:  Alert and oriented x 3 PSYCHIATRIC:  Normal affect    ASSESSMENT:   Patient with history of anxiety and palpitations.  EKG at primary care physician's office shows sinus tachycardia, EKG in the office shows sinus rhythm.  2-week cardiac monitor did not reveal any significant arrhythmias.  Patient triggered events were associated with sinus rhythm.  Orthostatic vitals in the office did not reveal any orthostasis.  Heart rate did increase somewhat by 8 to 12 bpm.  Not significant to diagnose POTS.  However, beta-blocker might be impeding results.  Beta-blockers have helped her symptoms somewhat, the symptoms seems to worsen with waning of Lopressor.  1. Palpitations    PLAN:    Stop Lopressor.  Start Toprol-XL 25 mg twice daily   Total encounter time more than 25 minutes  Greater than 50% was spent in counseling and coordination of care with the patient  Follow-up in 2 months  This note was generated in part or whole with voice recognition software. Voice recognition is usually quite accurate but there are transcription errors that can and very often do occur. I apologize for any typographical errors that were not detected and corrected.   Medication Adjustments/Labs and Tests Ordered: Current medicines are reviewed at length with the patient today.  Concerns  regarding medicines are outlined above.  Orders Placed This Encounter  Procedures  . EKG 12-Lead   Meds ordered this encounter  Medications  . metoprolol succinate (TOPROL XL) 25 MG 24 hr tablet    Sig: Take 1 tablet (25 mg total) by mouth 2 (two) times daily.    Dispense:  60 tablet    Refill:  5    Patient Instructions  Medication Instructions:  Your physician has recommended you make the following change in your medication:    STOP Lopressor  START Metoprolol Succinate 25 mg twice daily.  A new RX has been sent to your pharmacy.  *If you need a refill on your cardiac medications before your next appointment, please call your pharmacy*  Lab Work: None ordered If you have labs (blood work) drawn today and your tests are completely normal, you will receive your results only by: Marland Kitchen MyChart Message (if you have MyChart) OR . A paper copy in the mail If you have any lab test that is abnormal or we need to change your treatment, we will call you to review the results.  Testing/Procedures: None ordered  Follow-Up: At [facility], you and your health needs are our priority.  As part of our continuing mission to provide you with exceptional heart care, we have created designated Provider Care Teams.  These Care Teams include your primary Cardiologist (physician) and Advanced Practice Providers (APPs -  Physician Assistants and Nurse Practitioners) who all work together to provide you with the care you need, when you need it.  Your next appointment:   2 month(s)  The format for your next appointment:   In Person  Provider:    You may see  Dr. Garen Lah or one of the following Advanced Practice Providers on your designated Care Team:    Murray Hodgkins, NP  Christell Faith, PA-C  Marrianne Mood, PA-C   Other Instructions NA     Signed, Kate Sable, MD  11/08/2019 12:45 PM    [place]

## 2019-11-08 NOTE — Patient Instructions (Signed)
Medication Instructions:  Your physician has recommended you make the following change in your medication:    STOP Lopressor  START Metoprolol Succinate 25 mg twice daily.  A new RX has been sent to your pharmacy.  *If you need a refill on your cardiac medications before your next appointment, please call your pharmacy*  Lab Work: None ordered If you have labs (blood work) drawn today and your tests are completely normal, you will receive your results only by: Marland Kitchen MyChart Message (if you have MyChart) OR . A paper copy in the mail If you have any lab test that is abnormal or we need to change your treatment, we will call you to review the results.  Testing/Procedures: None ordered  Follow-Up: At [facility], you and your health needs are our priority.  As part of our continuing mission to provide you with exceptional heart care, we have created designated Provider Care Teams.  These Care Teams include your primary Cardiologist (physician) and Advanced Practice Providers (APPs -  Physician Assistants and Nurse Practitioners) who all work together to provide you with the care you need, when you need it.  Your next appointment:   2 month(s)  The format for your next appointment:   In Person  Provider:    You may see  Dr. Garen Lah or one of the following Advanced Practice Providers on your designated Care Team:    Murray Hodgkins, NP  Christell Faith, PA-C  Marrianne Mood, PA-C   Other Instructions NA

## 2019-11-22 ENCOUNTER — Ambulatory Visit: Payer: BC Managed Care – PPO | Admitting: Cardiology

## 2019-12-15 ENCOUNTER — Ambulatory Visit
Admission: EM | Admit: 2019-12-15 | Discharge: 2019-12-15 | Disposition: A | Discharge status: Home / Self Care | Payer: 59 | Attending: Emergency Medicine | Admitting: Emergency Medicine

## 2019-12-15 ENCOUNTER — Other Ambulatory Visit: Payer: Self-pay

## 2019-12-15 DIAGNOSIS — K0889 Other specified disorders of teeth and supporting structures: Secondary | ICD-10-CM

## 2019-12-15 MED ORDER — CLINDAMYCIN HCL 150 MG PO CAPS: 150.0000 mg | ORAL_CAPSULE | Freq: Four times a day (QID) | ORAL | 0 refills | Status: DC

## 2019-12-15 NOTE — Discharge Instructions (Addendum)
Clindamycin prescribed.  Take as directed and to completion Recommend a soft diet  Maintain proper dental hygiene Follow up with dentist as soon as possible for further evaluation and treatment

## 2019-12-15 NOTE — ED Triage Notes (Signed)
Pt has been unable to go to dentist and has began having dental pain again, states she is having more pain in left ear and feels that lymph nodes are swollen as well

## 2019-12-15 NOTE — ED Provider Notes (Addendum)
[facility]    CSN: [id number] Arrival date & time: 12/15/19  1325      History   Chief Complaint Chief Complaint  Patient presents with  . Dental Pain    HPI Lisa Grimes is a 24 y.o. female.    who presents with dental pain, left ear pain and swelling lymph node for the past 3-4 days  Denies a precipitating event or trauma.  Localizes pain to his left upper gum.  Has tried OTC analgesics without relief.  Worse with chewing.  Does not see a dentist regularly.  Reports similar symptoms in the past.  Denies fever, chills, dysphagia, odynophagia, oral or neck swelling, nausea, vomiting, chest pain, SOB.        Past Medical History:  Diagnosis Date  . Abnormal white blood cell count   . Acute bronchitis   . Anxiety   . Chlamydia 2016  . Depression with anxiety   . Dye allergic reaction   . Epistaxis   . Fatigue   . Gastroesophageal reflux disease   . Hypoglycemia   . Influenza A   . Insomnia   . Major depressive disorder, recurrent episode, moderate with anxious distress (HCC)   . Menstrual headache   . Migraine headache   . Mild major depression (HCC)   . Palpitations   . Panic disorder with agoraphobia and severe panic attacks   . Parent-child relational problem   . Rhinitis, allergic   . Screening for depression   . UTI (lower urinary tract infection)   . Viral syndrome     Patient Active Problem List   Diagnosis Date Noted  . Preeclampsia, severe, third trimester 06/13/2017  . Gestational hypertension 06/05/2017  . Supervision of high risk pregnancy, antepartum 04/22/2017  . Depression with anxiety 04/22/2017  . UTI (urinary tract infection) during pregnancy, first trimester 04/22/2017  . Anxiety 02/20/2015  . Fatigue 02/20/2015  . Acid reflux 02/20/2015  . Headache, menstrual migraine 02/20/2015  . Depression, major, recurrent, moderate (HCC) 02/20/2015  . Adaptive colitis 02/20/2015  . Headache, migraine 02/20/2015  . Panic disorder  with agoraphobia and severe panic attacks 02/20/2015    Past Surgical History:  Procedure Laterality Date  . EYE SURGERY    . MOUTH SURGERY  2010  . STRABISMUS SURGERY  1997  . TONSILLECTOMY  2003  . tubes in ear  2000    OB History    Gravida  1   Para  1   Term      Preterm  1   AB      Living  1     SAB      TAB      Ectopic      Multiple  0   Live Births  1            Home Medications    Prior to Admission medications   Medication Sig Start Date End Date Taking? Authorizing Provider  clindamycin (CLEOCIN) 150 MG capsule Take 1 capsule (150 mg total) by mouth every 6 (six) hours. 12/15/19   Kayela Humphres, Zachery Dakins, FNP  metoprolol succinate (TOPROL XL) 25 MG 24 hr tablet Take 1 tablet (25 mg total) by mouth 2 (two) times daily. 11/08/19   Debbe Odea, MD    Family History Family History  Problem Relation Age of Onset  . Depression Mother   . Anxiety disorder Mother   . Hypertension Mother   . Alcohol abuse Father   . Hypertension  Father   . Kidney disease Father   . Bipolar disorder Maternal Aunt   . Cancer Maternal Aunt        breast  . Hypertension Maternal Aunt   . Diabetes Maternal Aunt   . Bipolar disorder Maternal Uncle   . Hypertension Maternal Uncle   . Bipolar disorder Paternal Uncle   . Depression Maternal Grandmother   . Stroke Maternal Grandmother   . Hypertension Maternal Grandmother   . Cancer Maternal Grandmother        breast  . Heart disease Maternal Grandmother   . Depression Paternal Grandmother   . Bipolar disorder Paternal Grandmother   . Kidney disease Paternal Grandmother   . ADD / ADHD Cousin   . Depression Cousin   . Heart attack Maternal Aunt   . Heart disease Maternal Aunt   . Hypertension Maternal Aunt   . Heart attack Maternal Uncle   . Heart disease Maternal Uncle   . Diabetes Maternal Uncle   . Stroke Maternal Grandfather   . Hypertension Maternal Grandfather   . Heart disease Maternal Grandfather      Social History Social History   Tobacco Use  . Smoking status: Never Smoker  . Smokeless tobacco: Never Used  Substance Use Topics  . Alcohol use: No  . Drug use: No     Allergies   Amoxicillin, Latex, Penicillins, and Red dye   Review of Systems Review of Systems  Constitutional: Negative.   HENT: Positive for dental problem and ear pain.   Respiratory: Negative.   Cardiovascular: Negative.   All other systems reviewed and are negative.    Physical Exam Triage Vital Signs ED Triage Vitals  Enc Vitals Group     BP 12/15/19 1350 128/84     Pulse Rate 12/15/19 1350 87     Resp 12/15/19 1350 18     Temp 12/15/19 1350 98.6 F (37 C)     Temp src --      SpO2 12/15/19 1350 97 %     Weight --      Height --      Head Circumference --      Peak Flow --      Pain Score 12/15/19 1349 6     Pain Loc --      Pain Edu? --      Excl. in [place]? --    No data found.  Updated Vital Signs BP 128/84   Pulse 87   Temp 98.6 F (37 C)   Resp 18   LMP  (LMP Unknown)   SpO2 97%   Visual Acuity Right Eye Distance:   Left Eye Distance:   Bilateral Distance:    Right Eye Near:   Left Eye Near:    Bilateral Near:     Physical Exam Vitals and nursing note reviewed.  Constitutional:      General: She is not in acute distress.    Appearance: Normal appearance. She is normal weight. She is not toxic-appearing.  HENT:     Head: Normocephalic.     Right Ear: Tympanic membrane, ear canal and external ear normal. There is no impacted cerumen.     Left Ear: Tympanic membrane, ear canal and external ear normal. There is no impacted cerumen.     Mouth/Throat:     Lips: Pink.     Mouth: Mucous membranes are moist.     Dentition: Abnormal dentition. Dental tenderness present.     Pharynx: No oropharyngeal  exudate.  Cardiovascular:     Rate and Rhythm: Normal rate and regular rhythm.     Pulses: Normal pulses.     Heart sounds: Normal heart sounds. No murmur. No gallop.    Pulmonary:     Effort: Pulmonary effort is normal. No respiratory distress.     Breath sounds: Normal breath sounds. No wheezing, rhonchi or rales.  Chest:     Chest wall: No tenderness.  Neurological:     Mental Status: She is alert.      UC Treatments / Results  Labs (all labs ordered are listed, but only abnormal results are displayed) Labs Reviewed - No data to display  EKG   Radiology No results found.  Procedures Procedures (including critical care time)  Medications Ordered in UC Medications - No data to display  Initial Impression / Assessment and Plan / UC Course  I have reviewed the triage vital signs and the nursing notes.  Pertinent labs & imaging results that were available during my care of the patient were reviewed by me and considered in my medical decision making (see chart for details).   Dental pain Clindamycin was prescribed Advised patient to follow-up with dentist To return for worsening of symptoms  Final Clinical Impressions(s) / UC Diagnoses   Final diagnoses:  Pain, dental     Discharge Instructions     Clindamycin prescribed.  Take as directed and to completion Recommend a soft diet  Maintain proper dental hygiene Follow up with dentist as soon as possible for further evaluation and treatment      ED Prescriptions    Medication Sig Dispense Auth. Provider   clindamycin (CLEOCIN) 150 MG capsule Take 1 capsule (150 mg total) by mouth every 6 (six) hours. 28 capsule Kynlei Piontek, Zachery Dakins, FNP     PDMP not reviewed this encounter.   Durward Parcel, FNP 12/15/19 1408    Durward Parcel, FNP 12/15/19 1408

## 2020-01-02 ENCOUNTER — Other Ambulatory Visit: Payer: Self-pay | Admitting: Family Medicine

## 2020-01-10 ENCOUNTER — Encounter: Payer: Self-pay | Admitting: Cardiology

## 2020-01-10 ENCOUNTER — Other Ambulatory Visit: Payer: Self-pay

## 2020-01-10 ENCOUNTER — Ambulatory Visit (INDEPENDENT_AMBULATORY_CARE_PROVIDER_SITE_OTHER): Payer: 59 | Admitting: Cardiology

## 2020-01-10 VITALS — BP 110/84 | HR 95 | Ht 64.0 in | Wt 207.5 lb

## 2020-01-10 DIAGNOSIS — L819 Disorder of pigmentation, unspecified: Secondary | ICD-10-CM

## 2020-01-10 DIAGNOSIS — R002 Palpitations: Secondary | ICD-10-CM | POA: Diagnosis not present

## 2020-01-10 MED ORDER — METOPROLOL TARTRATE 50 MG PO TABS: 50.0000 mg | ORAL_TABLET | Freq: Two times a day (BID) | ORAL | 1 refills | Status: DC

## 2020-01-10 NOTE — Progress Notes (Signed)
Cardiology Office Note:    Date:  01/10/2020   ID:  Lisa Grimes, DOB Jun 22, 1996, MRN [id number]  PCP:  Maple Hudson., MD  Cardiologist:  No primary care provider on file.  Electrophysiologist:  None   Referring MD: Maple Hudson.,*   Chief Complaint  Patient presents with  . OTHER    2 month f/u c/o sharp chest pain and hands/feet turning bluish. Meds reviewed verbally with pt.    History of Present Illness:    Lisa Grimes is a 24 y.o. female with a hx of anxiety, who presents for follow-up.  She was originally seen due to symptoms of palpitations.    Patient has irregular heartbeats every now and then and also increased heart rates. Symptoms can happen for 2 to 3 days in a row then she can go for a month without anything happening.  Symptoms can last anywhere from minutes to a full hour when they occur. Symptoms are sometimes associated with feeling of fullness in her chest,occasional dizziness, blurred vision. She did not check her blood pressure with a heart rate.  She had video recordings of her heart rates at home showing 150s.  Orthostatic vitals in the office previously were negative for orthostasis.   She was started on Lopressor 25 mg twice daily.  She states taking the medication has improved her symptoms somewhat.  She saw EP and her symptoms may be secondary to sinus arrhythmias.  Her symptoms seem to worsen a couple of hours after she has taken her beta-blocker.  Toprol-XL twice daily was started patient states Toprol was not working as well as Midwife.  Patient also complains of discoloration/blueness in fingers and toes.  She showed pictures with the nailbeds of her fingers being blue.  Symptoms are not related with being in the cold.    Past Medical History:  Diagnosis Date  . Abnormal white blood cell count   . Acute bronchitis   . Anxiety   . Chlamydia 2016  . Depression with anxiety   . Dye allergic reaction   . Epistaxis   . Fatigue     . Gastroesophageal reflux disease   . Hypoglycemia   . Influenza A   . Insomnia   . Major depressive disorder, recurrent episode, moderate with anxious distress (HCC)   . Menstrual headache   . Migraine headache   . Mild major depression (HCC)   . Palpitations   . Panic disorder with agoraphobia and severe panic attacks   . Parent-child relational problem   . Rhinitis, allergic   . Screening for depression   . UTI (lower urinary tract infection)   . Viral syndrome     Past Surgical History:  Procedure Laterality Date  . EYE SURGERY    . MOUTH SURGERY  2010  . STRABISMUS SURGERY  1997  . TONSILLECTOMY  2003  . tubes in ear  2000    Current Medications: Current Meds  Medication Sig  . metoprolol succinate (TOPROL XL) 25 MG 24 hr tablet Take 1 tablet (25 mg total) by mouth 2 (two) times daily.     Allergies:   Amoxicillin, Latex, Penicillins, and Red dye   Social History   Socioeconomic History  . Marital status: Married    Spouse name: Not on file  . Number of children: Not on file  . Years of education: Not on file  . Highest education level: Not on file  Occupational History  . Not on file  Tobacco Use  . Smoking status: Never Smoker  . Smokeless tobacco: Never Used  Substance and Sexual Activity  . Alcohol use: No  . Drug use: No  . Sexual activity: Yes    Birth control/protection: None  Other Topics Concern  . Not on file  Social History Narrative  . Not on file   Social Determinants of Health   Financial Resource Strain:   . Difficulty of Paying Living Expenses: Not on file  Food Insecurity:   . Worried About Programme researcher, broadcasting/film/video in the Last Year: Not on file  . Ran Out of Food in the Last Year: Not on file  Transportation Needs:   . Lack of Transportation (Medical): Not on file  . Lack of Transportation (Non-Medical): Not on file  Physical Activity:   . Days of Exercise per Week: Not on file  . Minutes of Exercise per Session: Not on file   Stress:   . Feeling of Stress : Not on file  Social Connections:   . Frequency of Communication with Friends and Family: Not on file  . Frequency of Social Gatherings with Friends and Family: Not on file  . Attends Religious Services: Not on file  . Active Member of Clubs or Organizations: Not on file  . Attends Banker Meetings: Not on file  . Marital Status: Not on file     Family History: The patient's family history includes ADD / ADHD in her cousin; Alcohol abuse in her father; Anxiety disorder in her mother; Bipolar disorder in her maternal aunt, maternal uncle, paternal grandmother, and paternal uncle; Cancer in her maternal aunt and maternal grandmother; Depression in her cousin, maternal grandmother, mother, and paternal grandmother; Diabetes in her maternal aunt and maternal uncle; Heart attack in her maternal aunt and maternal uncle; Heart disease in her maternal aunt, maternal grandfather, maternal grandmother, and maternal uncle; Hypertension in her father, maternal aunt, maternal aunt, maternal grandfather, maternal grandmother, maternal uncle, and mother; Kidney disease in her father and paternal grandmother; Stroke in her maternal grandfather and maternal grandmother.  ROS:   Please see the history of present illness.     All other systems reviewed and are negative.  EKGs/Labs/Other Studies Reviewed:    The following studies were reviewed today: 2-week cardiac monitor date 10/06/2019 Patient had a min HR of 43 bpm, max HR of 176 bpm, and avg HR of 76 bpm. Predominant underlying rhythm was Sinus Rhythm. Ectopic Atrial Rhythm was present. Isolated SVEs were rare (<1.0%), SVE Couplets were rare (<1.0%), and no SVE Triplets were present. Isolated VEs were rare (<1.0%), and no VE Couplets or VE Triplets were present.  Patient triggered events/symptoms were associated with sinus rhythm.  EKG:  EKG is  ordered today.  The ekg ordered today demonstrates normal  sinus rhythm, nonspecific T wave changes  Recent Labs: 09/09/2019: ALT 11; BUN 10; Creatinine, Ser 0.68; Hemoglobin 14.0; Platelets 231; Potassium 4.1; Sodium 137; TSH 1.820  Recent Lipid Panel No results found for: CHOL, TRIG, HDL, CHOLHDL, VLDL, LDLCALC, LDLDIRECT  Physical Exam:    VS:  BP 110/84 (BP Location: Left Arm, Patient Position: Sitting, Cuff Size: Large)   Pulse 95   Ht 5\' 4"  (1.626 m)   Wt 207 lb 8 oz (94.1 kg)   LMP  (LMP Unknown)   SpO2 99%   BMI 35.62 kg/m     Wt Readings from Last 3 Encounters:  01/10/20 207 lb 8 oz (94.1 kg)  11/08/19 204 lb (  92.5 kg)  10/22/19 196 lb (88.9 kg)     GEN:  Well nourished, well developed in no acute distress HEENT: Normal NECK: No JVD; No carotid bruits LYMPHATICS: No lymphadenopathy CARDIAC: RRR, no murmurs, rubs, gallops RESPIRATORY:  Clear to auscultation without rales, wheezing or rhonchi  ABDOMEN: Soft, non-tender, non-distended MUSCULOSKELETAL:  No edema; No deformity, acrocyanosis in fingernail bed. SKIN: Warm and dry NEUROLOGIC:  Alert and oriented x 3 PSYCHIATRIC:  Normal affect    ASSESSMENT:    1. Palpitations   2. Discoloration of skin of finger    PLAN:    1. Patient with history of anxiety and palpitations.  EKG at primary care physician's office shows sinus tachycardia, EKG in the office shows sinus rhythm.  2-week cardiac monitor did not reveal any significant arrhythmias.  Patient triggered events were associated with sinus rhythm.  Toprol-XL, start Lopressor 50 mg twice daily.  2.  Patient with blueness in the fingers.  She is not a smoker as such Buerger's disease is not an etiology.  Advised patient to follow-up with primary care regarding input.  Not sure if her rheumatologic condition is in the etiology.  Follow-up in 6 months  This note was generated in part or whole with voice recognition software. Voice recognition is usually quite accurate but there are transcription errors that can and very  often do occur. I apologize for any typographical errors that were not detected and corrected.  Medication Adjustments/Labs and Tests Ordered: Current medicines are reviewed at length with the patient today.  Concerns regarding medicines are outlined above.  No orders of the defined types were placed in this encounter.  No orders of the defined types were placed in this encounter.   There are no Patient Instructions on file for this visit.   Signed, Kate Sable, MD  01/10/2020 2:53 PM    [place]

## 2020-01-10 NOTE — Patient Instructions (Signed)
Medication Instructions:  - Your physician has recommended you make the following change in your medication:   1) Stop toprol (metoprolol succinate)  2) Start lopressor (metoprolol tartrate) 50 mg- take 1 tablet by mouth TWICE daily  *If you need a refill on your cardiac medications before your next appointment, please call your pharmacy*   Lab Work: - none ordered  If you have labs (blood work) drawn today and your tests are completely normal, you will receive your results only by: Marland Kitchen MyChart Message (if you have MyChart) OR . A paper copy in the mail If you have any lab test that is abnormal or we need to change your treatment, we will call you to review the results.   Testing/Procedures: - none ordered   Follow-Up: At [facility], you and your health needs are our priority.  As part of our continuing mission to provide you with exceptional heart care, we have created designated Provider Care Teams.  These Care Teams include your primary Cardiologist (physician) and Advanced Practice Providers (APPs -  Physician Assistants and Nurse Practitioners) who all work together to provide you with the care you need, when you need it.  We recommend signing up for the patient portal called "MyChart".  Sign up information is provided on this After Visit Summary.  MyChart is used to connect with patients for Virtual Visits (Telemedicine).  Patients are able to view lab/test results, encounter notes, upcoming appointments, etc.  Non-urgent messages can be sent to your provider as well.   To learn more about what you can do with MyChart, go to [email or web address].    Your next appointment:   6 month(s)  The format for your next appointment:   In Person  Provider:   Debbe Odea, MD   Other Instructions N/a  Metoprolol Tablets What is this medicine? METOPROLOL (me TOE proe lole) is a beta blocker. It decreases the amount of work your heart has to do and helps your heart beat  regularly. It is used to treat high blood pressure and/or prevent chest pain (also called angina). It is also used after a heart attack to prevent a second one. This medicine may be used for other purposes; ask your health care provider or pharmacist if you have questions. COMMON BRAND NAME(S): Lopressor What should I tell my health care provider before I take this medicine? They need to know if you have any of these conditions:  diabetes  heart or vessel disease like slow heart rate, worsening heart failure, heart block, sick sinus syndrome or Raynaud's disease  kidney disease  liver disease  lung or breathing disease, like asthma or emphysema  pheochromocytoma  thyroid disease  an unusual or allergic reaction to metoprolol, other beta-blockers, medicines, foods, dyes, or preservatives  pregnant or trying to get pregnant  breast-feeding How should I use this medicine? Take this drug by mouth with water. Take it as directed on the prescription label at the same time every day. You can take it with or without food. You should always take it the same way. Keep taking it unless your health care provider tells you to stop. Talk to your health care provider about the use of this drug in children. Special care may be needed. Overdosage: If you think you have taken too much of this medicine contact a poison control center or emergency room at once. NOTE: This medicine is only for you. Do not share this medicine with others. What if I miss a dose? If  you miss a dose, take it as soon as you can. If it is almost time for your next dose, take only that dose. Do not take double or extra doses. What may interact with this medicine? This medicine may interact with the following medications:  certain medicines for blood pressure, heart disease, irregular heart beat  certain medicines for depression like monoamine oxidase (MAO) inhibitors, fluoxetine, or  paroxetine  clonidine  dobutamine  epinephrine  isoproterenol  reserpine This list may not describe all possible interactions. Give your health care provider a list of all the medicines, herbs, non-prescription drugs, or dietary supplements you use. Also tell them if you smoke, drink alcohol, or use illegal drugs. Some items may interact with your medicine. What should I watch for while using this medicine? Visit your doctor or health care professional for regular check ups. Contact your doctor right away if your symptoms worsen. Check your blood pressure and pulse rate regularly. Ask your health care professional what your blood pressure and pulse rate should be, and when you should contact them. You may get drowsy or dizzy. Do not drive, use machinery, or do anything that needs mental alertness until you know how this medicine affects you. Do not sit or stand up quickly, especially if you are an older patient. This reduces the risk of dizzy or fainting spells. Contact your doctor if these symptoms continue. Alcohol may interfere with the effect of this medicine. Avoid alcoholic drinks. This medicine may increase blood sugar. Ask your healthcare provider if changes in diet or medicines are needed if you have diabetes. What side effects may I notice from receiving this medicine? Side effects that you should report to your doctor or health care professional as soon as possible:  allergic reactions like skin rash, itching or hives  cold or numb hands or feet  depression  difficulty breathing  faint  fever with sore throat  irregular heartbeat, chest pain  rapid weight gain   signs and symptoms of high blood sugar such as being more thirsty or hungry or having to urinate more than normal. You may also feel very tired or have blurry vision.  swollen legs or ankles Side effects that usually do not require medical attention (report to your doctor or health care professional if they  continue or are bothersome):  anxiety or nervousness  change in sex drive or performance  dry skin  headache  nightmares or trouble sleeping  short term memory loss  stomach upset or diarrhea This list may not describe all possible side effects. Call your doctor for medical advice about side effects. You may report side effects to FDA at 1-800-FDA-1088. Where should I keep my medicine? Keep out of the reach of children and pets. Store at room temperature between 15 and 30 degrees C (59 and 86 degrees F). Protect from moisture. Keep the container tightly closed. Throw away any unused drug after the expiration date. NOTE: This sheet is a summary. It may not cover all possible information. If you have questions about this medicine, talk to your doctor, pharmacist, or health care provider.  2020 Elsevier/Gold Standard (2019-06-10 17:21:17)

## 2020-01-12 ENCOUNTER — Ambulatory Visit: Payer: 59 | Admitting: Family Medicine

## 2020-01-12 ENCOUNTER — Encounter: Payer: Self-pay | Admitting: Family Medicine

## 2020-01-12 ENCOUNTER — Other Ambulatory Visit: Payer: Self-pay

## 2020-01-12 VITALS — BP 121/81 | HR 85 | Temp 97.1°F | Resp 16 | Ht 64.0 in | Wt 208.0 lb

## 2020-01-12 DIAGNOSIS — E6609 Other obesity due to excess calories: Secondary | ICD-10-CM | POA: Diagnosis not present

## 2020-01-12 DIAGNOSIS — Z6835 Body mass index (BMI) 35.0-35.9, adult: Secondary | ICD-10-CM | POA: Diagnosis not present

## 2020-01-12 DIAGNOSIS — I7300 Raynaud's syndrome without gangrene: Secondary | ICD-10-CM | POA: Diagnosis not present

## 2020-01-12 DIAGNOSIS — R002 Palpitations: Secondary | ICD-10-CM

## 2020-01-12 MED ORDER — AMLODIPINE BESYLATE 2.5 MG PO TABS: 2.5000 mg | ORAL_TABLET | Freq: Every day | ORAL | 1 refills | Status: DC

## 2020-01-12 NOTE — Progress Notes (Signed)
Patient: Lisa Grimes Female    DOB: [birth year]   23 y.o.   MRN: [id number] Visit Date: 01/12/2020  Today's Provider: Megan Mans, MD   Chief Complaint  Patient presents with  . Follow-up   Subjective:     HPI  Patient has noticed in recent weeks that her nailbeds are bluish in her fingers and toes.  No other symptoms.  She has gone on to [employer] and is convinced that she has Ehlers-Danlos syndrome. Palpitations From 09/09/2019-seen by Dr. Beryle Flock. Started Metoprolol for sinus tachycardia - may be able to use this prn in the future. Labs checked showing-normal. Referred to Cardiology.   Sinus tachycardia From 09/09/2019-seen by Dr. Beryle Flock. Noted on EKG and patient says her symptoms are what she typically has with her palpitations. Restarted metoprolol 25 mg twice daily for rate control.    Patient did see cardiology. However, patient states they referred her back to her pcp.   Allergies  Allergen Reactions  . Amoxicillin Hives    Has patient had a PCN reaction causing immediate rash, facial/tongue/throat swelling, SOB or lightheadedness with hypotension: Yes Has patient had a PCN reaction causing severe rash involving mucus membranes or skin necrosis: No Has patient had a PCN reaction that required hospitalization No Has patient had a PCN reaction occurring within the last 10 years: No If all of the above answers are "NO", then may proceed with Cephalosporin use.   . Latex   . Penicillins Hives    Has patient had a PCN reaction causing immediate rash, facial/tongue/throat swelling, SOB or lightheadedness with hypotension: Yes Has patient had a PCN reaction causing severe rash involving mucus membranes or skin necrosis: No Has patient had a PCN reaction that required hospitalization No Has patient had a PCN reaction occurring within the last 10 years: no If all of the above answers are "NO", then may proceed with Cephalosporin use.   . Red Dye    possible allergy     Current Outpatient Medications:  .  metoprolol tartrate (LOPRESSOR) 50 MG tablet, Take 1 tablet (50 mg total) by mouth 2 (two) times daily., Disp: 180 tablet, Rfl: 1  Review of Systems  Constitutional: Negative for appetite change, chills, fatigue and fever.  Eyes: Negative.   Respiratory: Negative for chest tightness and shortness of breath.   Cardiovascular: Negative for chest pain and palpitations.  Gastrointestinal: Negative for abdominal pain, nausea and vomiting.  Allergic/Immunologic: Negative.   Neurological: Negative for dizziness and weakness.  Hematological: Negative.   Psychiatric/Behavioral: Negative.     Social History   Tobacco Use  . Smoking status: Never Smoker  . Smokeless tobacco: Never Used  Substance Use Topics  . Alcohol use: No      Objective:   BP 121/81 (BP Location: Left Arm, Patient Position: Sitting, Cuff Size: Large)   Pulse 85   Temp (!) 97.1 F (36.2 C) (Other (Comment))   Resp 16   Ht 5\' 4"  (1.626 m)   Wt 208 lb (94.3 kg)   LMP  (LMP Unknown)   SpO2 99%   BMI 35.70 kg/m  Vitals:   01/12/20 1334  BP: 121/81  Pulse: 85  Resp: 16  Temp: (!) 97.1 F (36.2 C)  TempSrc: Other (Comment)  SpO2: 99%  Weight: 208 lb (94.3 kg)  Height: 5\' 4"  (1.626 m)  Body mass index is 35.7 kg/m.   Physical Exam Vitals reviewed.  Constitutional:      Appearance: She  is obese.  HENT:     Head: Normocephalic and atraumatic.     Right Ear: External ear normal.     Left Ear: External ear normal.  Eyes:     General: No scleral icterus.    Conjunctiva/sclera: Conjunctivae normal.  Cardiovascular:     Rate and Rhythm: Normal rate and regular rhythm.     Pulses: Normal pulses.     Heart sounds: Normal heart sounds.     Comments: Nail beds are slightly blue.  Good capillary refill.  About 2-seconds.  Arterial pulses are good. Pulmonary:     Effort: Pulmonary effort is normal.     Breath sounds: Normal breath sounds.  Skin:     General: Skin is warm and dry.     Comments: Skin turgor is normal.  Neurological:     General: No focal deficit present.     Mental Status: She is alert and oriented to person, place, and time.  Psychiatric:        Mood and Affect: Mood normal.        Behavior: Behavior normal.        Thought Content: Thought content normal.      No results found for any visits on 01/12/20.     Assessment & Plan    1. Palpitations Controlled recently with metoprolol at this dose.  We can follow this for the patient. - amLODipine (NORVASC) 2.5 MG tablet; Take 1 tablet (2.5 mg total) by mouth daily.  Dispense: 90 tablet; Refill: 1  2. Raynaud's disease without gangrene Add amlodipine.  Low-dose.  Return to clinic 2 to 4 weeks to reassess.  Work-up further as indicated. - amLODipine (NORVASC) 2.5 MG tablet; Take 1 tablet (2.5 mg total) by mouth daily.  Dispense: 90 tablet; Refill: 1  3. Class 2 obesity due to excess calories without serious comorbidity with body mass index (BMI) of 35.0 to 35.9 in adult Patient is mother of 1.  She has gained a good bit of weight in the past few years.     Richard Cranford Mon, MD  [facility]

## 2020-02-01 ENCOUNTER — Ambulatory Visit: Payer: 59 | Admitting: Family Medicine

## 2020-02-01 ENCOUNTER — Other Ambulatory Visit: Payer: Self-pay

## 2020-02-01 ENCOUNTER — Encounter: Payer: Self-pay | Admitting: Family Medicine

## 2020-02-01 VITALS — BP 128/74 | HR 87 | Temp 96.9°F | Ht 64.0 in | Wt 210.2 lb

## 2020-02-01 DIAGNOSIS — I7300 Raynaud's syndrome without gangrene: Secondary | ICD-10-CM

## 2020-02-01 DIAGNOSIS — E6609 Other obesity due to excess calories: Secondary | ICD-10-CM

## 2020-02-01 DIAGNOSIS — F419 Anxiety disorder, unspecified: Secondary | ICD-10-CM

## 2020-02-01 DIAGNOSIS — R002 Palpitations: Secondary | ICD-10-CM

## 2020-02-01 DIAGNOSIS — Z6836 Body mass index (BMI) 36.0-36.9, adult: Secondary | ICD-10-CM

## 2020-02-01 NOTE — Progress Notes (Signed)
Patient: Lisa Grimes Female    DOB: 1996/06/01   23 y.o.   MRN: [id number] Visit Date: 02/01/2020  Today's Provider: Wilhemena Durie, MD   Chief Complaint  Patient presents with  . Raynaud's disease   Subjective:     HPI  Patient stop the amlodipine due to nausea.  She has no idea if it helped her fingers.  She continues to be worried that she has Ehlers-Danlos syndrome because she looked this up on Google.  She has no joint laxity and no skin issues. Patient says that amlodipine medication is making her feel nauseous and she also had an episode where she felt like she was going to pass out.   Palpitations From 01/12/2020-Controlled recently with metoprolol 2.5 mg.  We can follow this for the patient.  Raynaud's disease without gangrene From 01/12/2020-Added amlodipine.  Low-dose.  Return to clinic 2 to 4 weeks to reassess.  Work-up further as indicated. Proximal nailbeds continue to be bluish.  Toes are normal.  No pain in the feet or hands and no cold extremities. Class 2 obesity due to excess calories without serious comorbidity with body mass index (BMI) of 35.0 to 35.9 in adult From 01/12/2020-Patient is mother of 1.  She has gained a good bit of weight in the past few years.    Allergies  Allergen Reactions  . Amoxicillin Hives    Has patient had a PCN reaction causing immediate rash, facial/tongue/throat swelling, SOB or lightheadedness with hypotension: Yes Has patient had a PCN reaction causing severe rash involving mucus membranes or skin necrosis: No Has patient had a PCN reaction that required hospitalization No Has patient had a PCN reaction occurring within the last 10 years: No If all of the above answers are "NO", then may proceed with Cephalosporin use.   . Latex   . Penicillins Hives    Has patient had a PCN reaction causing immediate rash, facial/tongue/throat swelling, SOB or lightheadedness with hypotension: Yes Has patient had a PCN reaction  causing severe rash involving mucus membranes or skin necrosis: No Has patient had a PCN reaction that required hospitalization No Has patient had a PCN reaction occurring within the last 10 years: no If all of the above answers are "NO", then may proceed with Cephalosporin use.   . Red Dye     possible allergy     Current Outpatient Medications:  .  amLODipine (NORVASC) 2.5 MG tablet, Take 1 tablet (2.5 mg total) by mouth daily., Disp: 90 tablet, Rfl: 1 .  metoprolol tartrate (LOPRESSOR) 50 MG tablet, Take 1 tablet (50 mg total) by mouth 2 (two) times daily., Disp: 180 tablet, Rfl: 1  Review of Systems  Constitutional: Negative for appetite change, chills, fatigue and fever.  HENT: Negative.   Eyes: Negative.   Respiratory: Negative for chest tightness and shortness of breath.   Cardiovascular: Negative for chest pain and palpitations.  Gastrointestinal: Negative for abdominal pain, nausea and vomiting.  Endocrine: Negative.   Allergic/Immunologic: Negative.   Neurological: Negative for dizziness and weakness.  Psychiatric/Behavioral: The patient is nervous/anxious.     Social History   Tobacco Use  . Smoking status: Never Smoker  . Smokeless tobacco: Never Used  Substance Use Topics  . Alcohol use: No      Objective:   There were no vitals taken for this visit. There were no vitals filed for this visit.There is no height or weight on file to calculate BMI.  Physical Exam Vitals reviewed.  Constitutional:      Appearance: She is obese.  HENT:     Head: Normocephalic and atraumatic.     Right Ear: External ear normal.     Left Ear: External ear normal.  Eyes:     General: No scleral icterus.    Conjunctiva/sclera: Conjunctivae normal.  Cardiovascular:     Rate and Rhythm: Normal rate and regular rhythm.     Pulses: Normal pulses.     Heart sounds: Normal heart sounds.     Comments: Nail beds are slightly blue.  Good capillary refill.  About 2-seconds.   Arterial pulses are good. Pulmonary:     Effort: Pulmonary effort is normal.     Breath sounds: Normal breath sounds.  Skin:    General: Skin is warm and dry.     Capillary Refill: Capillary refill takes less than 2 seconds.     Comments: Skin turgor is normal.  Neurological:     General: No focal deficit present.     Mental Status: She is alert and oriented to person, place, and time.  Psychiatric:        Mood and Affect: Mood normal.        Behavior: Behavior normal.        Thought Content: Thought content normal.      No results found for any visits on 02/01/20.     Assessment & Plan    1. Raynaud's disease without gangrene She evidently stopped amlodipine after 1 dose.  I do not think she has Ehlers-Danlos syndrome but she insists that she wants to see a rheumatologist. - Ambulatory referral to Rheumatology  2. Anxiety I think this is a major issue for this patient and this is an ongoing issue.  I do think she would benefit from an SSRI. We will discuss this in July. 3. Palpitations Continue metoprolol for the time being.  Cardiac work-up negative. 4.Obesity    Lisa Rathje [name], MD  [facility]

## 2020-02-01 NOTE — Patient Instructions (Signed)
Stop Amlodipine 

## 2020-02-03 ENCOUNTER — Telehealth: Payer: Self-pay

## 2020-02-03 NOTE — Telephone Encounter (Signed)
Copied from CRM [id number]. Topic: Referral - Status >> Feb 03, 2020  8:38 AM Reggie Pile, NT wrote: Reason for CRM: [facility] called in to inform PCP that patient may need to see another provider in their office, before seeing provider she had requested. Please advise.

## 2020-02-03 NOTE — Telephone Encounter (Signed)
Ok with me 

## 2020-02-09 ENCOUNTER — Ambulatory Visit: Payer: Self-pay | Admitting: Family Medicine

## 2020-02-10 NOTE — Telephone Encounter (Signed)
Returned call to [facility] several times and no answer or vm.

## 2020-03-02 ENCOUNTER — Other Ambulatory Visit: Payer: Self-pay

## 2020-03-02 ENCOUNTER — Ambulatory Visit
Admission: EM | Admit: 2020-03-02 | Discharge: 2020-03-02 | Disposition: A | Discharge status: Home / Self Care | Payer: 59 | Attending: Emergency Medicine | Admitting: Emergency Medicine

## 2020-03-02 DIAGNOSIS — R300 Dysuria: Secondary | ICD-10-CM | POA: Insufficient documentation

## 2020-03-02 DIAGNOSIS — R8290 Unspecified abnormal findings in urine: Secondary | ICD-10-CM | POA: Diagnosis present

## 2020-03-02 DIAGNOSIS — R350 Frequency of micturition: Secondary | ICD-10-CM | POA: Diagnosis present

## 2020-03-02 LAB — POCT URINALYSIS DIP (MANUAL ENTRY)
Bilirubin, UA: NEGATIVE
Glucose, UA: NEGATIVE mg/dL
Ketones, POC UA: NEGATIVE mg/dL
Leukocytes, UA: NEGATIVE
Nitrite, UA: NEGATIVE
Protein Ur, POC: NEGATIVE mg/dL
Spec Grav, UA: 1.015
Urobilinogen, UA: 0.2 U/dL
pH, UA: 6.5

## 2020-03-02 LAB — POCT URINE PREGNANCY: Preg Test, Ur: NEGATIVE

## 2020-03-02 MED ORDER — PHENAZOPYRIDINE HCL 200 MG PO TABS: 200.0000 mg | ORAL_TABLET | Freq: Three times a day (TID) | ORAL | 0 refills | Status: DC

## 2020-03-02 MED ORDER — FLUCONAZOLE 200 MG PO TABS: ORAL_TABLET | ORAL | 0 refills | Status: DC

## 2020-03-02 NOTE — Discharge Instructions (Addendum)
Urine pregnancy was negative Urine did not show signs of infection Urine culture and vaginal swab sent.  We will call you with abnormal results.   Push fluids and get plenty of rest.   Diflucan prescribed for possible yeast infection Take pyridium as prescribed and as needed for relief of urinary symptoms Both above medications may contain red dye do not take if you have a life-threatening reaction Follow up with PCP if symptoms persists Return here or go to ER if you have any new or worsening symptoms such as fever, worsening abdominal pain, nausea/vomiting, flank pain, etc..Marland Kitchen

## 2020-03-02 NOTE — ED Triage Notes (Signed)
Pt presents with c/o dysuria and lower abdominal pain for past 3 days

## 2020-03-02 NOTE — ED Provider Notes (Signed)
[facility]   CC: UTI  SUBJECTIVE:  Lisa Grimes is a 24 y.o. female who complains of dysuria, malodors urine, urine frequency and urgency for the past 3 days.  Admits to recent sexual activity.  Is in a monogamous relationship with husband.  Denies concern for STDs.  Denies alleviating factors.  Symptoms are made worse with urination.  Admits to similar symptoms in the past with UTI and yeast infection.  Denies fever, chills, nausea, vomiting, abdominal pain, flank pain, abnormal vaginal discharge or bleeding, hematuria.    LMP: No LMP recorded. (Menstrual status: Irregular Periods).  ROS: As in HPI.  All other pertinent ROS negative.     Past Medical History:  Diagnosis Date  . Abnormal white blood cell count   . Acute bronchitis   . Anxiety   . Chlamydia 2016  . Depression with anxiety   . Dye allergic reaction   . Epistaxis   . Fatigue   . Gastroesophageal reflux disease   . Hypoglycemia   . Influenza A   . Insomnia   . Major depressive disorder, recurrent episode, moderate with anxious distress (HCC)   . Menstrual headache   . Migraine headache   . Mild major depression (HCC)   . Palpitations   . Panic disorder with agoraphobia and severe panic attacks   . Parent-child relational problem   . Rhinitis, allergic   . Screening for depression   . UTI (lower urinary tract infection)   . Viral syndrome    Past Surgical History:  Procedure Laterality Date  . EYE SURGERY    . MOUTH SURGERY  2010  . STRABISMUS SURGERY  1997  . TONSILLECTOMY  2003  . tubes in ear  2000   Allergies  Allergen Reactions  . Amoxicillin Hives    Has patient had a PCN reaction causing immediate rash, facial/tongue/throat swelling, SOB or lightheadedness with hypotension: Yes Has patient had a PCN reaction causing severe rash involving mucus membranes or skin necrosis: No Has patient had a PCN reaction that required hospitalization No Has patient had a PCN reaction occurring  within the last 10 years: No If all of the above answers are "NO", then may proceed with Cephalosporin use.   . Latex   . Penicillins Hives    Has patient had a PCN reaction causing immediate rash, facial/tongue/throat swelling, SOB or lightheadedness with hypotension: Yes Has patient had a PCN reaction causing severe rash involving mucus membranes or skin necrosis: No Has patient had a PCN reaction that required hospitalization No Has patient had a PCN reaction occurring within the last 10 years: no If all of the above answers are "NO", then may proceed with Cephalosporin use.   . Red Dye     possible allergy   No current facility-administered medications on file prior to encounter.   Current Outpatient Medications on File Prior to Encounter  Medication Sig Dispense Refill  . amLODipine (NORVASC) 2.5 MG tablet Take 1 tablet (2.5 mg total) by mouth daily. 90 tablet 1  . metoprolol tartrate (LOPRESSOR) 50 MG tablet Take 1 tablet (50 mg total) by mouth 2 (two) times daily. 180 tablet 1   Social History   Socioeconomic History  . Marital status: Married    Spouse name: Not on file  . Number of children: Not on file  . Years of education: Not on file  . Highest education level: Not on file  Occupational History  . Not on file  Tobacco Use  .  Smoking status: Never Smoker  . Smokeless tobacco: Never Used  Substance and Sexual Activity  . Alcohol use: No  . Drug use: No  . Sexual activity: Yes    Birth control/protection: None  Other Topics Concern  . Not on file  Social History Narrative  . Not on file   Social Determinants of Health   Financial Resource Strain:   . Difficulty of Paying Living Expenses:   Food Insecurity:   . Worried About Programme researcher, broadcasting/film/video in the Last Year:   . Barista in the Last Year:   Transportation Needs:   . Freight forwarder (Medical):   Marland Kitchen Lack of Transportation (Non-Medical):   Physical Activity:   . Days of Exercise per Week:    . Minutes of Exercise per Session:   Stress:   . Feeling of Stress :   Social Connections:   . Frequency of Communication with Friends and Family:   . Frequency of Social Gatherings with Friends and Family:   . Attends Religious Services:   . Active Member of Clubs or Organizations:   . Attends Banker Meetings:   Marland Kitchen Marital Status:   Intimate Partner Violence:   . Fear of Current or Ex-Partner:   . Emotionally Abused:   Marland Kitchen Physically Abused:   . Sexually Abused:    Family History  Problem Relation Age of Onset  . Depression Mother   . Anxiety disorder Mother   . Hypertension Mother   . Alcohol abuse Father   . Hypertension Father   . Kidney disease Father   . Bipolar disorder Maternal Aunt   . Cancer Maternal Aunt        breast  . Hypertension Maternal Aunt   . Diabetes Maternal Aunt   . Bipolar disorder Maternal Uncle   . Hypertension Maternal Uncle   . Bipolar disorder Paternal Uncle   . Depression Maternal Grandmother   . Stroke Maternal Grandmother   . Hypertension Maternal Grandmother   . Cancer Maternal Grandmother        breast  . Heart disease Maternal Grandmother   . Depression Paternal Grandmother   . Bipolar disorder Paternal Grandmother   . Kidney disease Paternal Grandmother   . ADD / ADHD Cousin   . Depression Cousin   . Heart attack Maternal Aunt   . Heart disease Maternal Aunt   . Hypertension Maternal Aunt   . Heart attack Maternal Uncle   . Heart disease Maternal Uncle   . Diabetes Maternal Uncle   . Stroke Maternal Grandfather   . Hypertension Maternal Grandfather   . Heart disease Maternal Grandfather     OBJECTIVE:  Vitals:   03/02/20 1522  BP: 121/80  Pulse: 72  Resp: 18  Temp: 97.8 F (36.6 C)  SpO2: 97%   General appearance: Alert in no acute distress HEENT: NCAT.  Oropharynx clear.  Lungs: clear to auscultation bilaterally without adventitious breath sounds Heart: regular rate and rhythm.   Abdomen: soft;  non-distended; no tenderness; bowel sounds present; no guarding Back: no CVA tenderness Extremities: no edema; symmetrical with no gross deformities Skin: warm and dry Neurologic: Ambulates from chair to exam table without difficulty Psychological: alert and cooperative; normal mood and affect  Labs Reviewed  POCT URINALYSIS DIP (MANUAL ENTRY) - Abnormal; Notable for the following components:      Result Value   Blood, UA small (*)    All other components within normal limits  URINE CULTURE  POCT URINE PREGNANCY  CERVICOVAGINAL ANCILLARY ONLY    ASSESSMENT & PLAN:  1. Dysuria   2. Abnormal urine odor   3. Urine frequency     Meds ordered this encounter  Medications  . fluconazole (DIFLUCAN) 200 MG tablet    Sig: Take one dose by mouth, wait 72 hours, and then take second dose by mouth    Dispense:  2 tablet    Refill:  0    Order Specific Question:   Supervising Provider    Answer:   Raylene Everts [[id number]]  . phenazopyridine (PYRIDIUM) 200 MG tablet    Sig: Take 1 tablet (200 mg total) by mouth 3 (three) times daily.    Dispense:  6 tablet    Refill:  0    Order Specific Question:   Supervising Provider    Answer:   Raylene Everts [[id number]]   Urine pregnancy was negative Urine did not show signs of infection Urine culture and vaginal swab sent.  We will call you with abnormal results.   Push fluids and get plenty of rest.   Diflucan prescribed for possible yeast infection Take pyridium as prescribed and as needed for relief of urinary symptoms Both above medications may contain red dye do not take if you have a life-threatening reaction Follow up with PCP if symptoms persists Return here or go to ER if you have any new or worsening symptoms such as fever, worsening abdominal pain, nausea/vomiting, flank pain, etc...  Outlined signs and symptoms indicating need for more acute intervention. Patient verbalized understanding. After Visit Summary given.       Lestine Box, PA-C 03/02/20 1545

## 2020-03-03 LAB — CERVICOVAGINAL ANCILLARY ONLY
Bacterial Vaginitis (gardnerella): NEGATIVE
Candida Glabrata: NEGATIVE
Candida Vaginitis: NEGATIVE
Chlamydia: NEGATIVE
Comment: NEGATIVE
Comment: NEGATIVE
Comment: NEGATIVE
Comment: NEGATIVE
Comment: NEGATIVE
Comment: NORMAL
Neisseria Gonorrhea: NEGATIVE
Trichomonas: NEGATIVE

## 2020-03-04 LAB — URINE CULTURE

## 2020-03-06 NOTE — Progress Notes (Signed)
Established patient visit   Patient: Lisa Grimes   DOB: June 08, 1996   24 y.o. Female  MRN: [id number] Visit Date: 03/07/2020  Today's healthcare provider: Trey Sailors, PA-C   Chief Complaint  Patient presents with  . Urinary Tract Infection   Subjective    Urinary Tract Infection  This is a new problem. The problem has been gradually worsening. There has been no fever. Associated symptoms include frequency, hematuria and urgency. Pertinent negatives include no chills, nausea or vomiting. The treatment provided no relief.   Was seen on 03/02/2020 at Urgent Care. Urine culture indeterminate due to too many species. Vaginal swab negative. Patient treated with diflucan and pyridium yet symptoms persist. No abdominal pain or back pain.      Medications: Outpatient Medications Prior to Visit  Medication Sig  . amLODipine (NORVASC) 2.5 MG tablet Take 1 tablet (2.5 mg total) by mouth daily.  . metoprolol tartrate (LOPRESSOR) 50 MG tablet Take 1 tablet (50 mg total) by mouth 2 (two) times daily.  . fluconazole (DIFLUCAN) 200 MG tablet Take one dose by mouth, wait 72 hours, and then take second dose by mouth  . phenazopyridine (PYRIDIUM) 200 MG tablet Take 1 tablet (200 mg total) by mouth 3 (three) times daily.   No facility-administered medications prior to visit.    Review of Systems  Constitutional: Positive for fatigue. Negative for activity change, appetite change, chills, diaphoresis, fever and unexpected weight change.  Gastrointestinal: Positive for abdominal pain. Negative for abdominal distention, anal bleeding, blood in stool, constipation, diarrhea, nausea, rectal pain and vomiting.  Genitourinary: Positive for dysuria, frequency, hematuria and urgency. Negative for difficulty urinating, vaginal bleeding, vaginal discharge and vaginal pain.  Musculoskeletal: Positive for back pain.  Neurological: Positive for dizziness. Negative for light-headedness and headaches.        Objective    BP 122/78 (BP Location: Right Arm, Patient Position: Sitting, Cuff Size: Large)   Pulse 92   Temp (!) 97.1 F (36.2 C) (Temporal)   Wt 209 lb (94.8 kg)   SpO2 98%   BMI 35.87 kg/m    Physical Exam Constitutional:      Appearance: Normal appearance.  Cardiovascular:     Rate and Rhythm: Normal rate and regular rhythm.     Heart sounds: Normal heart sounds.  Pulmonary:     Effort: Pulmonary effort is normal.     Breath sounds: Normal breath sounds.  Abdominal:     General: Abdomen is flat. Bowel sounds are normal.     Palpations: Abdomen is soft.     Tenderness: There is no right CVA tenderness or left CVA tenderness.  Skin:    General: Skin is warm and dry.  Neurological:     Mental Status: She is alert and oriented to person, place, and time. Mental status is at baseline.  Psychiatric:        Mood and Affect: Mood normal.        Behavior: Behavior normal.       Results for orders placed or performed in visit on 03/07/20  POCT urinalysis dipstick  Result Value Ref Range   Color, UA     Clarity, UA     Glucose, UA Negative Negative   Bilirubin, UA Negative    Ketones, UA Negative    Spec Grav, UA 1.025 1.010 - 1.025   Blood, UA Moderate    pH, UA 5.0 5.0 - 8.0   Protein, UA Negative Negative  Urobilinogen, UA 0.2 0.2 or 1.0 E.U./dL   Nitrite, UA Negative    Leukocytes, UA Negative Negative   Appearance     Odor      Assessment & Plan    1. Dysuria  Treat as UTI.  - CULTURE, URINE COMPREHENSIVE - POCT urinalysis dipstick - sulfamethoxazole-trimethoprim (BACTRIM DS) 800-160 MG tablet; Take 1 tablet by mouth 2 (two) times daily for 3 days.  Dispense: 6 tablet; Refill: 0   Return if symptoms worsen or fail to improve.      ITrinna Post, PA-C, have reviewed all documentation for this visit. The documentation on 03/07/20 for the exam, diagnosis, procedures, and orders are all accurate and complete.    Paulene Floor  [facility] [phone] (phone) [phone] (fax)  [place]

## 2020-03-07 ENCOUNTER — Encounter: Payer: Self-pay | Admitting: Physician Assistant

## 2020-03-07 ENCOUNTER — Other Ambulatory Visit: Payer: Self-pay

## 2020-03-07 ENCOUNTER — Ambulatory Visit: Payer: 59 | Admitting: Physician Assistant

## 2020-03-07 VITALS — BP 122/78 | HR 92 | Temp 97.1°F | Wt 209.0 lb

## 2020-03-07 DIAGNOSIS — R300 Dysuria: Secondary | ICD-10-CM

## 2020-03-07 LAB — POCT URINALYSIS DIPSTICK
Bilirubin, UA: NEGATIVE
Glucose, UA: NEGATIVE
Ketones, UA: NEGATIVE
Leukocytes, UA: NEGATIVE
Nitrite, UA: NEGATIVE
Protein, UA: NEGATIVE
Spec Grav, UA: 1.025 (ref 1.010–1.025)
Urobilinogen, UA: 0.2 E.U./dL
pH, UA: 5 (ref 5.0–8.0)

## 2020-03-07 MED ORDER — SULFAMETHOXAZOLE-TRIMETHOPRIM 800-160 MG PO TABS: 1.0000 | ORAL_TABLET | Freq: Two times a day (BID) | ORAL | 0 refills | Status: AC

## 2020-03-07 NOTE — Patient Instructions (Signed)

## 2020-03-10 LAB — CULTURE, URINE COMPREHENSIVE

## 2020-03-13 ENCOUNTER — Encounter: Payer: Self-pay | Admitting: Family Medicine

## 2020-03-13 ENCOUNTER — Other Ambulatory Visit: Payer: Self-pay

## 2020-03-13 ENCOUNTER — Ambulatory Visit: Payer: 59 | Admitting: Family Medicine

## 2020-03-13 ENCOUNTER — Ambulatory Visit (INDEPENDENT_AMBULATORY_CARE_PROVIDER_SITE_OTHER): Payer: 59 | Admitting: Family Medicine

## 2020-03-13 VITALS — BP 117/79 | HR 86 | Temp 97.5°F | Resp 16 | Ht 64.0 in | Wt 209.0 lb

## 2020-03-13 DIAGNOSIS — N390 Urinary tract infection, site not specified: Secondary | ICD-10-CM

## 2020-03-13 DIAGNOSIS — R300 Dysuria: Secondary | ICD-10-CM | POA: Diagnosis not present

## 2020-03-13 LAB — POCT URINALYSIS DIPSTICK
Bilirubin, UA: NEGATIVE
Glucose, UA: NEGATIVE
Ketones, UA: NEGATIVE
Nitrite, UA: NEGATIVE
Protein, UA: NEGATIVE
Spec Grav, UA: 1.015 (ref 1.010–1.025)
Urobilinogen, UA: 0.2 E.U./dL
pH, UA: 6 (ref 5.0–8.0)

## 2020-03-13 MED ORDER — CEPHALEXIN 500 MG PO CAPS: 500.0000 mg | ORAL_CAPSULE | Freq: Three times a day (TID) | ORAL | 0 refills | Status: AC

## 2020-03-13 NOTE — Progress Notes (Signed)
See note above by Daniel Le, medical student, cosigned by me 

## 2020-03-13 NOTE — Patient Instructions (Signed)
Urinary Tract Infection, Adult A urinary tract infection (UTI) is an infection of any part of the urinary tract. The urinary tract includes:  The kidneys.  The ureters.  The bladder.  The urethra. These organs make, store, and get rid of pee (urine) in the body. What are the causes? This is caused by germs (bacteria) in your genital area. These germs grow and cause swelling (inflammation) of your urinary tract. What increases the risk? You are more likely to develop this condition if:  You have a small, thin tube (catheter) to drain pee.  You cannot control when you pee or poop (incontinence).  You are female, and: ? You use these methods to prevent pregnancy:  A medicine that kills sperm (spermicide).  A device that blocks sperm (diaphragm). ? You have low levels of a female hormone (estrogen). ? You are pregnant.  You have genes that add to your risk.  You are sexually active.  You take antibiotic medicines.  You have trouble peeing because of: ? A prostate that is bigger than normal, if you are female. ? A blockage in the part of your body that drains pee from the bladder (urethra). ? A kidney stone. ? A nerve condition that affects your bladder (neurogenic bladder). ? Not getting enough to drink. ? Not peeing often enough.  You have other conditions, such as: ? Diabetes. ? A weak disease-fighting system (immune system). ? Sickle cell disease. ? Gout. ? Injury of the spine. What are the signs or symptoms? Symptoms of this condition include:  Needing to pee right away (urgently).  Peeing often.  Peeing small amounts often.  Pain or burning when peeing.  Blood in the pee.  Pee that smells bad or not like normal.  Trouble peeing.  Pee that is cloudy.  Fluid coming from the vagina, if you are female.  Pain in the belly or lower back. Other symptoms include:  Throwing up (vomiting).  No urge to eat.  Feeling mixed up (confused).  Being tired  and grouchy (irritable).  A fever.  Watery poop (diarrhea). How is this treated? This condition may be treated with:  Antibiotic medicine.  Other medicines.  Drinking enough water. Follow these instructions at home:  Medicines  Take over-the-counter and prescription medicines only as told by your doctor.  If you were prescribed an antibiotic medicine, take it as told by your doctor. Do not stop taking it even if you start to feel better. General instructions  Make sure you: ? Pee until your bladder is empty. ? Do not hold pee for a long time. ? Empty your bladder after sex. ? Wipe from front to back after pooping if you are a female. Use each tissue one time when you wipe.  Drink enough fluid to keep your pee pale yellow.  Keep all follow-up visits as told by your doctor. This is important. Contact a doctor if:  You do not get better after 1-2 days.  Your symptoms go away and then come back. Get help right away if:  You have very bad back pain.  You have very bad pain in your lower belly.  You have a fever.  You are sick to your stomach (nauseous).  You are throwing up. Summary  A urinary tract infection (UTI) is an infection of any part of the urinary tract.  This condition is caused by germs in your genital area.  There are many risk factors for a UTI. These include having a small, thin   tube to drain pee and not being able to control when you pee or poop.  Treatment includes antibiotic medicines for germs.  Drink enough fluid to keep your pee pale yellow. This information is not intended to replace advice given to you by your health care provider. Make sure you discuss any questions you have with your health care provider. Document Revised: 10/15/2018 Document Reviewed: 05/07/2018 Elsevier Patient Education  2020 [employer]  

## 2020-03-13 NOTE — Progress Notes (Addendum)
Established patient visit   Patient: Lisa Grimes   DOB: 1996/11/02   24 y.o. Female  MRN: [id number] Visit Date: 03/13/2020  Today's healthcare provider: Shirlee Latch, MD   Chief Complaint  Patient presents with  . Urinary Tract Infection   Subjective    HPI Urinary Tract Infection:  Patient complains of burning with urination, dysuria and incomplete bladder emptying   She has had symptoms for 2 weeks.  Pt had cloudy urine initially and notices blood when she wipes Pt was last seen for this 5 days ago Management at last visit includes Bactrim for 3 days Pt states that she got no improvement in symptoms Patient also complains of back pain.  Patient denies congestion, cough, fever, headache, rhinitis, sorethroat, stomach ache and vaginal discharge.  Patient does have a history of recurrent UTI.   Patient does have a history of pyelonephritis.  Pt is not currently taking anything Pt had a family history of polycystic kidney disease in her father  Pt had a negative STI screen on 03/02/2020    Social History   Tobacco Use  . Smoking status: Never Smoker  . Smokeless tobacco: Never Used  Substance Use Topics  . Alcohol use: No  . Drug use: No   Social History   Socioeconomic History  . Marital status: Married    Spouse name: Not on file  . Number of children: Not on file  . Years of education: Not on file  . Highest education level: Not on file  Occupational History  . Not on file  Tobacco Use  . Smoking status: Never Smoker  . Smokeless tobacco: Never Used  Substance and Sexual Activity  . Alcohol use: No  . Drug use: No  . Sexual activity: Yes    Birth control/protection: None  Other Topics Concern  . Not on file  Social History Narrative  . Not on file   Social Determinants of Health   Financial Resource Strain:   . Difficulty of Paying Living Expenses:   Food Insecurity:   . Worried About Programme researcher, broadcasting/film/video in the Last Year:   . Occupational psychologist in the Last Year:   Transportation Needs:   . Freight forwarder (Medical):   Marland Kitchen Lack of Transportation (Non-Medical):   Physical Activity:   . Days of Exercise per Week:   . Minutes of Exercise per Session:   Stress:   . Feeling of Stress :   Social Connections:   . Frequency of Communication with Friends and Family:   . Frequency of Social Gatherings with Friends and Family:   . Attends Religious Services:   . Active Member of Clubs or Organizations:   . Attends Banker Meetings:   Marland Kitchen Marital Status:   Intimate Partner Violence:   . Fear of Current or Ex-Partner:   . Emotionally Abused:   Marland Kitchen Physically Abused:   . Sexually Abused:        Medications: Outpatient Medications Prior to Visit  Medication Sig  . amLODipine (NORVASC) 2.5 MG tablet Take 1 tablet (2.5 mg total) by mouth daily.  . metoprolol tartrate (LOPRESSOR) 50 MG tablet Take 1 tablet (50 mg total) by mouth 2 (two) times daily.  . phenazopyridine (PYRIDIUM) 200 MG tablet Take 1 tablet (200 mg total) by mouth 3 (three) times daily.  . [DISCONTINUED] fluconazole (DIFLUCAN) 200 MG tablet Take one dose by mouth, wait 72 hours, and then take second dose by  mouth   No facility-administered medications prior to visit.    Review of Systems  Constitutional: Negative.   HENT: Negative.   Eyes: Negative.   Respiratory: Negative.   Cardiovascular: Negative.   Gastrointestinal: Negative.   Endocrine: Negative.   Genitourinary: Positive for difficulty urinating, dysuria and flank pain. Negative for vaginal bleeding, vaginal discharge and vaginal pain.  Skin: Negative.   Allergic/Immunologic: Negative.   Neurological: Negative.   Hematological: Negative.   Psychiatric/Behavioral: Negative.     Last CBC Lab Results  Component Value Date   WBC 6.3 09/09/2019   HGB 14.0 09/09/2019   HCT 42.2 09/09/2019   MCV 80 09/09/2019   MCH 26.5 (L) 09/09/2019   RDW 12.9 09/09/2019   PLT 231  29/51/8841   Last metabolic panel Lab Results  Component Value Date   GLUCOSE 104 (H) 09/09/2019   NA 137 09/09/2019   K 4.1 09/09/2019   CL 102 09/09/2019   CO2 20 09/09/2019   BUN 10 09/09/2019   CREATININE 0.68 09/09/2019   GFRNONAA 123 09/09/2019   GFRAA 142 09/09/2019   CALCIUM 9.3 09/09/2019   PROT 7.0 09/09/2019   ALBUMIN 4.3 09/09/2019   LABGLOB 2.7 09/09/2019   AGRATIO 1.6 09/09/2019   BILITOT 0.4 09/09/2019   ALKPHOS 94 09/09/2019   AST 17 09/09/2019   ALT 11 09/09/2019   ANIONGAP 13 01/03/2018      Objective    BP 117/79 (BP Location: Left Arm, Patient Position: Sitting, Cuff Size: Large)   Pulse 86   Temp (!) 97.5 F (36.4 C) (Temporal)   Resp 16   Ht 5\' 4"  (1.626 m)   Wt 209 lb (94.8 kg)   LMP 02/21/2020 (Approximate)   Breastfeeding No   BMI 35.87 kg/m  BP Readings from Last 3 Encounters:  03/13/20 117/79  03/07/20 122/78  03/02/20 121/80   Wt Readings from Last 3 Encounters:  03/13/20 209 lb (94.8 kg)  03/07/20 209 lb (94.8 kg)  02/01/20 210 lb 3.2 oz (95.3 kg)      Physical Exam Constitutional:      Appearance: Normal appearance.  HENT:     Head: Normocephalic and atraumatic.  Cardiovascular:     Rate and Rhythm: Normal rate and regular rhythm.     Pulses: Normal pulses.     Heart sounds: Normal heart sounds.  Pulmonary:     Effort: Pulmonary effort is normal.     Breath sounds: Normal breath sounds.  Abdominal:     General: Bowel sounds are normal.     Palpations: Abdomen is soft. There is no shifting dullness, fluid wave, hepatomegaly, splenomegaly, mass or pulsatile mass.     Tenderness: There is abdominal tenderness in the right lower quadrant, suprapubic area and left lower quadrant. There is no right CVA tenderness, left CVA tenderness, guarding or rebound.     Comments: Low back TTP, but no CVA tenderness  Musculoskeletal:     Cervical back: Neck supple.  Skin:    General: Skin is warm and dry.  Neurological:     General:  No focal deficit present.     Mental Status: She is alert and oriented to person, place, and time.  Psychiatric:        Mood and Affect: Mood normal.        Behavior: Behavior normal.      Results for orders placed or performed in visit on 03/13/20  POCT urinalysis dipstick  Result Value Ref Range   Color, UA  yellow    Clarity, UA clear    Glucose, UA Negative Negative   Bilirubin, UA Negative    Ketones, UA Negative    Spec Grav, UA 1.015 1.010 - 1.025   Blood, UA trace    pH, UA 6.0 5.0 - 8.0   Protein, UA Negative Negative   Urobilinogen, UA 0.2 0.2 or 1.0 E.U./dL   Nitrite, UA Negative    Leukocytes, UA Small (1+) (A) Negative    Assessment & Plan     1. Dysuria / Urinary tract infection without hematuria, site unspecified Pt with 2 week history of UTI not currently on medication Pt tried 3 day course of bactrim with no relief Urinalysis and physical exam is consistent with uncomplicated UTI Pylonephritis is less likely given patient is not febrile and acutely ill  Plan: Will do 5 day course of cephalexin Urine microscopy and culture is not indicated at this time given recent tests Will continue to monitor Follow up if symptoms continue to persist   Return if symptoms worsen or fail to improve.      Terrill Mohr, Medical Student [facility]  [facility] [phone] (phone) [phone] (fax)  [facility]
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
## 2020-04-21 ENCOUNTER — Encounter: Payer: Self-pay | Admitting: Rheumatology

## 2020-04-21 ENCOUNTER — Ambulatory Visit (INDEPENDENT_AMBULATORY_CARE_PROVIDER_SITE_OTHER): Payer: 59 | Admitting: Rheumatology

## 2020-04-21 ENCOUNTER — Other Ambulatory Visit: Payer: Self-pay

## 2020-04-21 VITALS — BP 126/84 | HR 73 | Resp 15 | Ht 63.5 in | Wt 209.0 lb

## 2020-04-21 DIAGNOSIS — M7701 Medial epicondylitis, right elbow: Secondary | ICD-10-CM | POA: Diagnosis not present

## 2020-04-21 DIAGNOSIS — M7702 Medial epicondylitis, left elbow: Secondary | ICD-10-CM

## 2020-04-21 DIAGNOSIS — I7300 Raynaud's syndrome without gangrene: Secondary | ICD-10-CM

## 2020-04-21 DIAGNOSIS — Z8719 Personal history of other diseases of the digestive system: Secondary | ICD-10-CM

## 2020-04-21 DIAGNOSIS — Z8669 Personal history of other diseases of the nervous system and sense organs: Secondary | ICD-10-CM

## 2020-04-21 DIAGNOSIS — M25552 Pain in left hip: Secondary | ICD-10-CM

## 2020-04-21 DIAGNOSIS — M25551 Pain in right hip: Secondary | ICD-10-CM | POA: Diagnosis not present

## 2020-04-21 DIAGNOSIS — O1413 Severe pre-eclampsia, third trimester: Secondary | ICD-10-CM

## 2020-04-21 DIAGNOSIS — G8929 Other chronic pain: Secondary | ICD-10-CM

## 2020-04-21 DIAGNOSIS — Z8679 Personal history of other diseases of the circulatory system: Secondary | ICD-10-CM

## 2020-04-21 DIAGNOSIS — K589 Irritable bowel syndrome without diarrhea: Secondary | ICD-10-CM

## 2020-04-21 DIAGNOSIS — F418 Other specified anxiety disorders: Secondary | ICD-10-CM

## 2020-04-25 ENCOUNTER — Ambulatory Visit (INDEPENDENT_AMBULATORY_CARE_PROVIDER_SITE_OTHER): Payer: 59 | Admitting: Family Medicine

## 2020-04-25 ENCOUNTER — Encounter: Payer: Self-pay | Admitting: Family Medicine

## 2020-04-25 ENCOUNTER — Other Ambulatory Visit: Payer: Self-pay

## 2020-04-25 VITALS — BP 116/80 | HR 81 | Temp 97.5°F | Resp 16 | Ht 64.0 in | Wt 210.0 lb

## 2020-04-25 DIAGNOSIS — I889 Nonspecific lymphadenitis, unspecified: Secondary | ICD-10-CM | POA: Diagnosis not present

## 2020-04-25 DIAGNOSIS — F419 Anxiety disorder, unspecified: Secondary | ICD-10-CM | POA: Diagnosis not present

## 2020-04-25 DIAGNOSIS — E6609 Other obesity due to excess calories: Secondary | ICD-10-CM | POA: Diagnosis not present

## 2020-04-25 DIAGNOSIS — Z6836 Body mass index (BMI) 36.0-36.9, adult: Secondary | ICD-10-CM | POA: Diagnosis not present

## 2020-04-25 MED ORDER — CLINDAMYCIN HCL 150 MG PO CAPS: 150.0000 mg | ORAL_CAPSULE | Freq: Three times a day (TID) | ORAL | 0 refills | Status: DC

## 2020-04-25 NOTE — Progress Notes (Signed)
Established patient visit  I,April Miller,acting as a scribe for Megan Mans, MD.,have documented all relevant documentation on the behalf of Megan Mans, MD,as directed by  Megan Mans, MD while in the presence of Megan Mans, MD.   Patient: Lisa Grimes   DOB: [birth year]   24 y.o. Female  MRN: [id number] Visit Date: 04/25/2020  Today's healthcare provider: Megan Mans, MD   Chief Complaint  Patient presents with  . Dental Pain   Subjective    Mouth Lesions  The current episode started 3 to 5 days ago (3 days). The onset is undetermined. Nothing relieves the symptoms. Associated symptoms include mouth sores, swollen glands, neck pain and neck stiffness. Pertinent negatives include no orthopnea, no fever, no decreased vision, no double vision, no eye itching, no photophobia, no abdominal pain, no constipation, no diarrhea, no nausea, no vomiting, no congestion, no ear discharge, no ear pain, no headaches, no hearing loss, no rhinorrhea, no sore throat, no stridor, no muscle aches, no cough, no URI, no wheezing, no rash, no diaper rash, no eye discharge, no eye pain and no eye redness.   Patient states she has had either tooth pain or a lesion in her mouth for 3 days that is very painful. Sore is on bottom left side of her mouth. Patient states she also has symptoms of lymph node enlargement on left side of neck and neck pain (left side). Patient states she has been using peroxide to treat lesion.  No fevers or systemic symptoms.      Medications: Outpatient Medications Prior to Visit  Medication Sig  . metoprolol tartrate (LOPRESSOR) 50 MG tablet Take 1 tablet (50 mg total) by mouth 2 (two) times daily.   No facility-administered medications prior to visit.    Review of Systems  Constitutional: Negative for appetite change, chills, fatigue and fever.  HENT: Positive for mouth sores. Negative for congestion, ear discharge, ear pain, hearing  loss, rhinorrhea and sore throat.   Eyes: Negative for double vision, photophobia, pain, discharge, redness and itching.  Respiratory: Negative for cough, chest tightness, shortness of breath, wheezing and stridor.   Cardiovascular: Negative for chest pain, palpitations and orthopnea.  Gastrointestinal: Negative for abdominal pain, constipation, diarrhea, nausea and vomiting.  Musculoskeletal: Positive for neck pain.  Skin: Negative for rash.  Allergic/Immunologic: Negative.   Neurological: Negative for dizziness, weakness and headaches.  Hematological: Negative.   Psychiatric/Behavioral: Negative.        Objective    BP 116/80 (BP Location: Left Arm, Patient Position: Sitting, Cuff Size: Large)   Pulse 81   Temp (!) 97.5 F (36.4 C) (Other (Comment))   Resp 16   Ht 5\' 4"  (1.626 m)   Wt 210 lb (95.3 kg)   SpO2 97%   BMI 36.05 kg/m  Wt Readings from Last 3 Encounters:  04/25/20 210 lb (95.3 kg)  04/21/20 209 lb (94.8 kg)  03/13/20 209 lb (94.8 kg)      Physical Exam Vitals reviewed.  Constitutional:      Appearance: She is obese.  HENT:     Head: Normocephalic and atraumatic.     Right Ear: External ear normal.     Left Ear: External ear normal.     Nose: Nose normal.     Mouth/Throat:     Pharynx: Oropharynx is clear. No oropharyngeal exudate or posterior oropharyngeal erythema.     Comments: It looks like her back left lower molar has  just erupted.  Just in front of this it looks like she has bitten her buccal mucosa and where it is thickened. Eyes:     General: No scleral icterus.    Conjunctiva/sclera: Conjunctivae normal.  Cardiovascular:     Rate and Rhythm: Normal rate and regular rhythm.     Pulses: Normal pulses.     Heart sounds: Normal heart sounds.     Comments: Nail beds are slightly blue.  Good capillary refill.  About 2-seconds.  Arterial pulses are good. Pulmonary:     Effort: Pulmonary effort is normal.     Breath sounds: Normal breath sounds.    Lymphadenopathy:     Cervical: No cervical adenopathy.  Skin:    General: Skin is warm and dry.     Capillary Refill: Capillary refill takes less than 2 seconds.     Comments: Skin turgor is normal.  Neurological:     General: No focal deficit present.     Mental Status: She is alert and oriented to person, place, and time.  Psychiatric:        Mood and Affect: Mood normal.        Behavior: Behavior normal.        Thought Content: Thought content normal.        No results found for any visits on 04/25/20.  Assessment & Plan     1. Lymphadenitis Treat for 1 week and see if it resolves.  She might need to see the dentist.  May need ENT referral if I cannot find anything else that might be causing her symptoms.  2. Anxiety Chronic problem.  3. Class 2 obesity due to excess calories without serious comorbidity with body mass index (BMI) of 36.0 to 36.9 in adult Lifestyle changes needed.   No follow-ups on file.      I, Wilhemena Durie, MD, have reviewed all documentation for this visit. The documentation on 04/27/20 for the exam, diagnosis, procedures, and orders are all accurate and complete.    Nickisha Hum Cranford Mon, MD  [facility] [phone] (phone) [phone] (fax)  [place]

## 2020-04-28 ENCOUNTER — Telehealth: Payer: Self-pay

## 2020-04-28 NOTE — Telephone Encounter (Signed)
[place] told the scheduler to call me about the pt but the pt do not have an implanted device. The pt had an event monitor in 2020. If the monitor is not implanted in the pt it is not a device pt.

## 2020-05-05 NOTE — Progress Notes (Signed)
Office Visit Note  Patient: Lisa Grimes             Date of Birth: Mar 21, 1996           MRN: [id number]             PCP: Jerrol Banana., MD Referring: Jerrol Banana.,* Visit Date: 05/09/2020 Occupation: _0 @  Subjective:  Discuss [facility] labs  History of Present Illness: Lisa Grimes is a 24 y.o. female with history of positive ANA and Raynaud's. She presents today to discuss [facility] lab work.  According to the patient she had recent lab work on 04/28/20 at her cardiologists office.  She was found to have low iron and low vitamin D and was started on supplementation, which she has been taking for 1 week.  She has not noticed any improvement in her fatigue or other symptoms yet.  She continues to have intermittent symptoms of Raynaud's.  She has recurrent oral and nasal ulcerations.  According to the patient she had a recent oral ulcer infection and was treated with clindamycin, which she has completed.  She will be following up with her dentist on 05/24/20. She continues to have intermittent rashes.  She denies any facial rashes or hair loss.  She has pain in both hips, both knees, and both ankle joints.  She reports occasional swelling in both hands.     Activities of Daily Living:  Patient reports morning stiffness for 30-60  minutes.   Patient Reports nocturnal pain.  Difficulty dressing/grooming: Denies Difficulty climbing stairs: Denies Difficulty getting out of chair: Denies Difficulty using hands for taps, buttons, cutlery, and/or writing: Reports  Review of Systems  Constitutional: Positive for fatigue.  HENT: Positive for mouth sores. Negative for mouth dryness and nose dryness.   Eyes: Positive for dryness. Negative for pain, itching and visual disturbance.  Respiratory: Negative for cough, hemoptysis, shortness of breath and difficulty breathing.   Cardiovascular: Positive for palpitations. Negative for hypertension and swelling in legs/feet.    Gastrointestinal: Positive for constipation. Negative for blood in stool and diarrhea.  Endocrine: Negative for increased urination.  Genitourinary: Positive for painful urination.  Musculoskeletal: Positive for arthralgias, joint pain, joint swelling, myalgias, morning stiffness, muscle tenderness and myalgias. Negative for muscle weakness.  Skin: Positive for color change and rash. Negative for pallor, hair loss, nodules/bumps, skin tightness, ulcers and sensitivity to sunlight.  Allergic/Immunologic: Negative for susceptible to infections.  Neurological: Positive for memory loss. Negative for dizziness, numbness and weakness.  Hematological: Positive for bruising/bleeding tendency. Negative for swollen glands.  Psychiatric/Behavioral: Negative for depressed mood, confusion and sleep disturbance. The patient is not nervous/anxious.     PMFS History:  Patient Active Problem List   Diagnosis Date Noted  . Preeclampsia, severe, third trimester 06/13/2017  . Gestational hypertension 06/05/2017  . Supervision of high risk pregnancy, antepartum 04/22/2017  . Depression with anxiety 04/22/2017  . UTI (urinary tract infection) during pregnancy, first trimester 04/22/2017  . Anxiety 02/20/2015  . Fatigue 02/20/2015  . Acid reflux 02/20/2015  . Headache, menstrual migraine 02/20/2015  . Depression, major, recurrent, moderate ([place]) 02/20/2015  . Adaptive colitis 02/20/2015  . Headache, migraine 02/20/2015  . Panic disorder with agoraphobia and severe panic attacks 02/20/2015    Past Medical History:  Diagnosis Date  . Abnormal white blood cell count   . Acute bronchitis   . Anemia    per patient   . Anxiety   . Chlamydia 2016  .  Depression with anxiety   . Dye allergic reaction   . Epistaxis   . Fatigue   . Gastroesophageal reflux disease   . Hypoglycemia   . IBS (irritable bowel syndrome)    per patient   . Influenza A   . Insomnia   . Major depressive disorder, recurrent  episode, moderate with anxious distress ([place])   . Menstrual headache   . Migraine headache   . Mild major depression ([place])   . Palpitations   . Panic disorder with agoraphobia and severe panic attacks   . Parent-child relational problem   . Rhinitis, allergic   . Screening for depression   . Viral syndrome   . Vitamin D deficiency    per patient     Family History  Problem Relation Age of Onset  . Depression Mother   . Anxiety disorder Mother   . Hypertension Mother   . Alcohol abuse Father   . Hypertension Father   . Kidney disease Father   . Bipolar disorder Maternal Aunt   . Cancer Maternal Aunt        breast  . Hypertension Maternal Aunt   . Diabetes Maternal Aunt   . Bipolar disorder Maternal Uncle   . Hypertension Maternal Uncle   . Bipolar disorder Paternal Uncle   . Depression Maternal Grandmother   . Stroke Maternal Grandmother   . Hypertension Maternal Grandmother   . Cancer Maternal Grandmother        breast  . Heart disease Maternal Grandmother   . Depression Paternal Grandmother   . Bipolar disorder Paternal Grandmother   . Kidney disease Paternal Grandmother   . Fibromyalgia Paternal Grandmother   . ADD / ADHD Cousin   . Depression Cousin   . Heart attack Maternal Aunt   . Heart disease Maternal Aunt   . Hypertension Maternal Aunt   . Heart attack Maternal Uncle   . Heart disease Maternal Uncle   . Diabetes Maternal Uncle   . Stroke Maternal Grandfather   . Hypertension Maternal Grandfather   . Heart disease Maternal Grandfather   . Healthy Brother   . Healthy Brother   . Healthy Daughter    Past Surgical History:  Procedure Laterality Date  . EYE SURGERY    . MOUTH SURGERY  2010  . STRABISMUS SURGERY  1997  . TONSILLECTOMY  2003  . tubes in ear  2000   Social History   Social History Narrative  . Not on file   Immunization History  Administered Date(s) Administered  . DTaP 06/20/1995, 08/20/1995, 10/20/1995, 07/20/1996, 03/24/2000    . Hepatitis A 10/09/2006, 07/16/2007  . Hepatitis B 11-Apr-1995, 06/20/1995, 10/20/1995  . HiB (PRP-OMP) 06/20/1995, 08/20/1995, 10/20/1995, 07/20/1996  . IPV 06/20/1995, 08/20/1995, 10/20/1995, 03/24/2000  . Influenza-Unspecified 10/09/2006, 08/16/2013  . MMR 07/20/1996, 03/24/2000  . Meningococcal Conjugate 10/09/2006, 08/16/2013  . Tdap 07/16/2007, 06/16/2017  . Varicella 07/20/1996, 10/09/2006     Objective: Vital Signs: BP 122/78 (BP Location: Left Arm, Patient Position: Sitting, Cuff Size: Normal)   Pulse 70   Resp 16   Ht _0  (1.6 m)   Wt 210 lb (95.3 kg)   BMI 37.20 kg/m    Physical Exam Vitals and nursing note reviewed.  Constitutional:      Appearance: She is well-developed.  HENT:     Head: Normocephalic and atraumatic.  Eyes:     Conjunctiva/sclera: Conjunctivae normal.  Pulmonary:     Effort: Pulmonary effort is normal.  Abdominal:  General: Bowel sounds are normal.     Palpations: Abdomen is soft.  Musculoskeletal:     Cervical back: Normal range of motion.  Lymphadenopathy:     Cervical: No cervical adenopathy.  Skin:    General: Skin is warm and dry.     Capillary Refill: Capillary refill takes less than 2 seconds.  Neurological:     Mental Status: She is alert and oriented to person, place, and time.  Psychiatric:        Behavior: Behavior normal.      Musculoskeletal Exam: C-spine, thoracic spine, and lumbar spine good ROM.  Shoulder joints, elbow joints, wrist joints, MCPs, PIPs, and DIPs good ROM with no synovitis. Complete fist formation bilaterally.  Tenderness of the right CMC joint and right 4th MCP joint.  Hip joints, knee joints, ankle joints, MTPs, PIPs, and DIPs good ROM with no synovitis.  No warmth or effusion of knee joints noted.  No tenderness or swelling of ankle joints.  No tenderness of MTP joints.  Tenderness over the right trochanteric bursa.   CDAI Exam: CDAI Score: -- Patient Global: --; Provider Global: -- Swollen: --;  Tender: -- Joint Exam 05/09/2020   No joint exam has been documented for this visit   There is currently no information documented on the homunculus. Go to the Rheumatology activity and complete the homunculus joint exam.  Investigation: No additional findings.  Imaging: No results found.  Recent Labs: Lab Results  Component Value Date   WBC 6.3 09/09/2019   HGB 14.0 09/09/2019   PLT 231 09/09/2019   NA 137 09/09/2019   K 4.1 09/09/2019   CL 102 09/09/2019   CO2 20 09/09/2019   GLUCOSE 104 (H) 09/09/2019   BUN 10 09/09/2019   CREATININE 0.68 09/09/2019   BILITOT 0.4 09/09/2019   ALKPHOS 94 09/09/2019   AST 17 09/09/2019   ALT 11 09/09/2019   PROT 7.0 09/09/2019   ALBUMIN 4.3 09/09/2019   CALCIUM 9.3 09/09/2019   GFRAA 142 09/09/2019    speciality Comments: No specialty comments available.  Procedures:  No procedures performed Allergies: Amoxicillin, Latex, Penicillins, and Red dye   Assessment / Plan:     Visit Diagnoses: Raynaud's disease without gangrene - History of Raynaud's for many years.  She has low titer positive ANA and weak positive histone antibody.  No other clinical features of autoimmune disease were noted.  She has no digital ulcerations or signs of gangrene.  No nailbed capillary changes noted.  She was advised to notify us if she develops any new or worsening symptoms.  She will follow up in 6 months.   Positive ANA (antinuclear antibody): April 24, 2020 lupus index -0.6, ANA 1: 160 nuclear dots, ENA negative, antihistone negative, CB CAP negative, Jo 1 -, anticardiolipin negative, beta-2 negative, APS negative, RF negative, anti-CCP negative, antiCar-P negative antithyroglobulin negative, anti-TPO negative: [facility] labs were reviewed with the patient today in the office and all questions were addressed.  She has been experiencing intermittent symptoms of Raynaud's for many years.  She has no digital ulcerations or signs of gangrene.  She has good capillary  refill and no nailbed changes.  She has recurrent oral and nasal ulcerations.  No malar rash noted on exam.  She has not had any recent hair loss.  She has arthralgias but no joint inflammation.  She does not require immunosuppressive therapy at this time. She was advised to notify us if she develops any new or worsening symptoms.  Medial epicondylitis of both elbows - Most likely related to her job and taking care of her child.  She has tenderness to palpation on exam.    Chronic pain of both hips - Patient declined x-rays at the last visit.  She has good ROM of both hip joints with no discomfort.  No groin pain.  She has tenderness to palpation over the right trochanteric bursa.   Hx of sinus tachycardia - She is on metoprolol which could be contributing to the Raynaud's phenomenon. She was advised to discuss switching to another medication, which will not contribute to her symptoms.    Other medical conditions are listed below:  Preeclampsia, severe, third trimester - 2018 with premature delivery at 34 weeks.  She is gravida 1, para 1 miscarriages 0.  Hx of migraines  History of gastroesophageal reflux (GERD)  Adaptive colitis  Depression with anxiety  Orders: No orders of the defined types were placed in this encounter.  No orders of the defined types were placed in this encounter.     Follow-Up Instructions: Return in about 6 months (around 11/08/2020) for Raynaud's syndrome.   Hazel Sams, PA-C  I examined and evaluated the patient with Hazel Sams PA.  We are detailed discussion with patient regarding labs.  She has low titer positive ANA and history of Raynaud's.  She has no other clinical features of autoimmune disease.  I have advised her to contact me in case she develops any new symptoms.  We will reevaluate her in 6 months.  The plan of care was discussed as noted above.  Bo Merino, MD  Note - This record has been created using Editor, commissioning.  Chart  creation errors have been sought, but may not always  have been located. Such creation errors do not reflect on  the standard of medical care.

## 2020-05-09 ENCOUNTER — Encounter: Payer: Self-pay | Admitting: Rheumatology

## 2020-05-09 ENCOUNTER — Ambulatory Visit (INDEPENDENT_AMBULATORY_CARE_PROVIDER_SITE_OTHER): Payer: 59 | Admitting: Rheumatology

## 2020-05-09 ENCOUNTER — Other Ambulatory Visit: Payer: Self-pay

## 2020-05-09 VITALS — BP 122/78 | HR 70 | Resp 16 | Ht 63.0 in | Wt 210.0 lb

## 2020-05-09 DIAGNOSIS — I7300 Raynaud's syndrome without gangrene: Secondary | ICD-10-CM | POA: Diagnosis not present

## 2020-05-09 DIAGNOSIS — M7702 Medial epicondylitis, left elbow: Secondary | ICD-10-CM

## 2020-05-09 DIAGNOSIS — M7701 Medial epicondylitis, right elbow: Secondary | ICD-10-CM | POA: Diagnosis not present

## 2020-05-09 DIAGNOSIS — Z8679 Personal history of other diseases of the circulatory system: Secondary | ICD-10-CM

## 2020-05-09 DIAGNOSIS — M25551 Pain in right hip: Secondary | ICD-10-CM | POA: Diagnosis not present

## 2020-05-09 DIAGNOSIS — G8929 Other chronic pain: Secondary | ICD-10-CM

## 2020-05-09 DIAGNOSIS — F418 Other specified anxiety disorders: Secondary | ICD-10-CM

## 2020-05-09 DIAGNOSIS — Z8669 Personal history of other diseases of the nervous system and sense organs: Secondary | ICD-10-CM

## 2020-05-09 DIAGNOSIS — K589 Irritable bowel syndrome without diarrhea: Secondary | ICD-10-CM

## 2020-05-09 DIAGNOSIS — Z8719 Personal history of other diseases of the digestive system: Secondary | ICD-10-CM

## 2020-05-09 DIAGNOSIS — O1413 Severe pre-eclampsia, third trimester: Secondary | ICD-10-CM

## 2020-05-09 DIAGNOSIS — M25552 Pain in left hip: Secondary | ICD-10-CM

## 2020-05-09 DIAGNOSIS — R768 Other specified abnormal immunological findings in serum: Secondary | ICD-10-CM

## 2020-06-05 ENCOUNTER — Ambulatory Visit: Payer: Self-pay | Admitting: Family Medicine

## 2020-06-22 NOTE — Progress Notes (Signed)
Virtual Visit via Video Note  I connected with Lisa Grimes on 06/26/20 at  1:00 PM EDT by a video enabled telemedicine application and verified that I am speaking with the correct person using two identifiers.  Location: Patient: Home  Provider: Office   I discussed the limitations of evaluation and management by telemedicine and the availability of in person appointments. The patient expressed understanding and agreed to proceed.  History of Present Illness: Patient is seen in rheumatology for Raynaud's phenomenon and was advised by rheumatology to possibly change medications for the blood pressure tachycardia from a beta-blocker to a different medication.     Observations/Objective:   Assessment and Plan: 1. Palpitations After discussion with patient we will try low-dose diltiazem at 120 XR daily.  I will see her back in 1 month. We will stop metoprolol as of starting the diltiazem. 2. Anxiety Chronic issue  3. Raynaud's disease without gangrene Followed by rheumatology with positive ANA   Follow Up Instructions:    I discussed the assessment and treatment plan with the patient. The patient was provided an opportunity to ask questions and all were answered. The patient agreed with the plan and demonstrated an understanding of the instructions.   The patient was advised to call back or seek an in-person evaluation if the symptoms worsen or if the condition fails to improve as anticipated.  I provided 10 minutes of non-face-to-face time during this encounter.   Richard [name], MD  MyChart Video Visit    Virtual Visit via Video Note   This visit type was conducted due to national recommendations for restrictions regarding the COVID-19 Pandemic (e.g. social distancing) in an effort to limit this patient's exposure and mitigate transmission in our community. This patient is at least at moderate risk for complications without adequate follow up. This format is  felt to be most appropriate for this patient at this time. Physical exam was limited by quality of the video and audio technology used for the visit.   Patient location:  Provider location:    Patient: Lisa Grimes   DOB: [birth year]   24 y.o. Female  MRN: [id number] Visit Date: 06/26/2020  Today's healthcare provider: Megan Mans, MD   Chief Complaint  Patient presents with  . Anxiety  . Palpitations   Subjective    HPI  HPI  Anxiety, Follow-up  She was last seen for anxiety 5 months ago. Changes made at last visit include; I think this is a major issue for this patient and this is an ongoing issue.  I do think she would benefit from an SSRI. We will discuss this in July.   She reports good compliance with treatment. She reports good tolerance of treatment. She is not having side effects.   She feels her anxiety is mild and Improved since last visit.  Symptoms: No chest pain No difficulty concentrating  No dizziness No fatigue  No feelings of losing control No insomnia  No irritable No palpitations  No panic attacks No racing thoughts  No shortness of breath No sweating  No tremors/shakes    GAD-7 Results No flowsheet data found.  PHQ-9 Scores PHQ9 SCORE ONLY 06/09/2017 04/22/2017 01/29/2016  PHQ-9 Total Score 0 2 13    ---------------------------------------------------------------------------------------------------  Raynaud's disease without gangrene From 02/01/2020-She evidently stopped amlodipine after 1 dose. I do not think she has Ehlers-Danlos syndrome but she insists that she wants to see a rheumatologist. Patient states Rheumatologist recommended a different beta blocker  to see if symptoms improve.   Palpitations From 02/01/2020-Continue metoprolol for the time being.  Cardiac work-up negative. Patient states Rheumatologist recommended a different beta blocker to see if symptoms improve.       Medications: Outpatient Medications Prior to Visit    Medication Sig  . ferrous sulfate 325 (65 FE) MG tablet Take 650 mg by mouth every other day.  Marland Kitchen VITAMIN D PO Take 1,000 Units by mouth daily.  . clindamycin (CLEOCIN) 150 MG capsule Take 1 capsule (150 mg total) by mouth 3 (three) times daily. (Patient not taking: Reported on 05/09/2020)  . metoprolol tartrate (LOPRESSOR) 50 MG tablet Take 1 tablet (50 mg total) by mouth 2 (two) times daily.   No facility-administered medications prior to visit.    Review of Systems  Constitutional: Negative.   Respiratory: Negative.   Cardiovascular: Negative.   Musculoskeletal: Negative.     Last thyroid functions Lab Results  Component Value Date   TSH 1.820 09/09/2019      Objective    There were no vitals taken for this visit. BP Readings from Last 3 Encounters:  05/09/20 122/78  04/25/20 116/80  04/21/20 126/84   Wt Readings from Last 3 Encounters:  05/09/20 210 lb (95.3 kg)  04/25/20 210 lb (95.3 kg)  04/21/20 209 lb (94.8 kg)      Physical Exam     Assessment & Plan       No follow-ups on file.     I discussed the assessment and treatment plan with the patient. The patient was provided an opportunity to ask questions and all were answered. The patient agreed with the plan and demonstrated an understanding of the instructions.   The patient was advised to call back or seek an in-person evaluation if the symptoms worsen or if the condition fails to improve as anticipated.  I provided 10 minutes of non-face-to-face time during this encounter.    Richard [name], MD [facility] [phone] (phone) [phone] (fax)  [facility]

## 2020-06-26 ENCOUNTER — Telehealth (INDEPENDENT_AMBULATORY_CARE_PROVIDER_SITE_OTHER): Payer: 59 | Admitting: Family Medicine

## 2020-06-26 ENCOUNTER — Encounter: Payer: Self-pay | Admitting: Family Medicine

## 2020-06-26 DIAGNOSIS — F419 Anxiety disorder, unspecified: Secondary | ICD-10-CM | POA: Diagnosis not present

## 2020-06-26 DIAGNOSIS — I7300 Raynaud's syndrome without gangrene: Secondary | ICD-10-CM

## 2020-06-26 DIAGNOSIS — R002 Palpitations: Secondary | ICD-10-CM

## 2020-06-26 MED ORDER — DILTIAZEM HCL ER 60 MG PO CP12: 60.0000 mg | ORAL_CAPSULE | Freq: Two times a day (BID) | ORAL | 5 refills | Status: DC

## 2020-06-27 ENCOUNTER — Telehealth: Payer: Self-pay

## 2020-06-27 DIAGNOSIS — R002 Palpitations: Secondary | ICD-10-CM

## 2020-06-27 NOTE — Telephone Encounter (Signed)
Copied from CRM [id number]. Topic: General - Other >> Jun 27, 2020 12:52 PM Jaquita Rector A wrote: Reason for CRM: Patient called to inform Dr Sullivan Lone that her insurance does not cover medication that he prescribed her insurance does not have any medication coverage. Per patient she would like to know from Dr Sullivan Lone is there an alternative to medication or something that she can get samples of Please call Ph# [phone]

## 2020-06-28 MED ORDER — VERAPAMIL HCL ER 120 MG PO TBCR: 120.0000 mg | EXTENDED_RELEASE_TABLET | Freq: Every day | ORAL | 1 refills | Status: DC

## 2020-06-28 NOTE — Addendum Note (Signed)
Addended by: Aleene Davidson on: 06/28/2020 03:47 PM   Modules accepted: Orders

## 2020-06-28 NOTE — Telephone Encounter (Signed)
Patient advised to discontinue Diltiazem and Metoprolol. New medication prescription sent in for Verapamil to patient's pharmacy.

## 2020-06-28 NOTE — Telephone Encounter (Signed)
Called to advise patient, left voicemail to call back.

## 2020-06-28 NOTE — Telephone Encounter (Signed)
Discontinue metoprolol and diltiazem which is the one that the computer said had some coverage.  Try verapamil XR 120 mg daily

## 2020-07-10 ENCOUNTER — Other Ambulatory Visit: Payer: Self-pay

## 2020-07-10 MED ORDER — METOPROLOL TARTRATE 50 MG PO TABS: 50.0000 mg | ORAL_TABLET | Freq: Two times a day (BID) | ORAL | 0 refills | Status: DC

## 2020-07-20 ENCOUNTER — Telehealth: Payer: Self-pay | Admitting: Rheumatology

## 2020-07-20 NOTE — Telephone Encounter (Signed)
Neysa Bonito from [facility] called stating Tiffancy is a mutual patient of Gaspar Bidding and requesting a copy of the results from her [facility] labs.  Please fax to #[phone].   Phone #[phone] which is Christy's direct line.

## 2020-07-20 NOTE — Telephone Encounter (Signed)
Faxed copy of [facility] labs as requested.

## 2020-07-23 ENCOUNTER — Other Ambulatory Visit: Payer: Self-pay

## 2020-07-23 ENCOUNTER — Encounter (HOSPITAL_COMMUNITY): Payer: Self-pay | Admitting: Emergency Medicine

## 2020-07-23 ENCOUNTER — Emergency Department (HOSPITAL_COMMUNITY): Payer: 59

## 2020-07-23 ENCOUNTER — Emergency Department (HOSPITAL_COMMUNITY)
Admission: EM | Admit: 2020-07-23 | Discharge: 2020-07-23 | Disposition: A | Discharge status: Home / Self Care | Payer: 59 | Attending: Emergency Medicine | Admitting: Emergency Medicine

## 2020-07-23 DIAGNOSIS — G43109 Migraine with aura, not intractable, without status migrainosus: Secondary | ICD-10-CM | POA: Diagnosis not present

## 2020-07-23 DIAGNOSIS — R519 Headache, unspecified: Secondary | ICD-10-CM | POA: Diagnosis present

## 2020-07-23 DIAGNOSIS — Z79899 Other long term (current) drug therapy: Secondary | ICD-10-CM | POA: Diagnosis not present

## 2020-07-23 DIAGNOSIS — Z91040 Latex allergy status: Secondary | ICD-10-CM | POA: Diagnosis not present

## 2020-07-23 HISTORY — DX: Tachycardia, unspecified: R00.0

## 2020-07-23 MED ORDER — KETOROLAC TROMETHAMINE 30 MG/ML IJ SOLN
30.0000 mg | Freq: Once | INTRAMUSCULAR | Status: AC
  Administered 2020-07-23: 30 mg via INTRAVENOUS
  Filled 2020-07-23: qty 1

## 2020-07-23 MED ORDER — PROCHLORPERAZINE EDISYLATE 10 MG/2ML IJ SOLN
10.0000 mg | Freq: Once | INTRAMUSCULAR | Status: AC
  Administered 2020-07-23: 10 mg via INTRAVENOUS
  Filled 2020-07-23: qty 2

## 2020-07-23 MED ORDER — DIPHENHYDRAMINE HCL 50 MG/ML IJ SOLN
12.5000 mg | Freq: Once | INTRAMUSCULAR | Status: AC
  Administered 2020-07-23: 12.5 mg via INTRAVENOUS
  Filled 2020-07-23: qty 1

## 2020-07-23 NOTE — ED Notes (Signed)
PT DENIES HEADACHE AT THIS TIME.

## 2020-07-23 NOTE — ED Notes (Signed)
Verbalized understanding of instructions.  No pen pad available.

## 2020-07-23 NOTE — Discharge Instructions (Addendum)
Follow-up with your doctor as we discussed.  Return to the ED as needed for worsening symptoms

## 2020-07-23 NOTE — ED Notes (Signed)
C/o sudden onset of headache this morning at 10:30 am with visual changes in both eyes. Denies any injury.

## 2020-07-23 NOTE — ED Provider Notes (Signed)
[facility] EMERGENCY DEPARTMENT Provider Note   CSN: [id number] Arrival date & time: 07/23/20  1336     History Chief Complaint  Patient presents with  . Headache    Lisa Grimes is a 24 y.o. female.  HPI   Pt presents to the ED with complaints of headache and visual changes.  Pt has history of headache but has not had one in a while.  Did recently changes medications for tachycardia from metoprolol to diltaizem.  Today developed pain on the right side of the head.  Also felt like vision was blurred and hazy primarily on the periphery.  No trouble with balance speech or coordination. No fevers or chills.  No cough.  No numbness or weakness.  Sx are improving since the onset this am.  Past Medical History:  Diagnosis Date  . Abnormal white blood cell count   . Acute bronchitis   . Anemia    per patient   . Anxiety   . Chlamydia 2016  . Depression with anxiety   . Dye allergic reaction   . Epistaxis   . Fatigue   . Gastroesophageal reflux disease   . Hypoglycemia   . IBS (irritable bowel syndrome)    per patient   . Influenza A   . Insomnia   . Major depressive disorder, recurrent episode, moderate with anxious distress (HCC)   . Menstrual headache   . Migraine headache   . Mild major depression (HCC)   . Palpitations   . Panic disorder with agoraphobia and severe panic attacks   . Parent-child relational problem   . Rhinitis, allergic   . Screening for depression   . Sinus tachycardia   . Viral syndrome   . Vitamin D deficiency    per patient     Patient Active Problem List   Diagnosis Date Noted  . Preeclampsia, severe, third trimester 06/13/2017  . Gestational hypertension 06/05/2017  . Supervision of high risk pregnancy, antepartum 04/22/2017  . Depression with anxiety 04/22/2017  . UTI (urinary tract infection) during pregnancy, first trimester 04/22/2017  . Anxiety 02/20/2015  . Fatigue 02/20/2015  . Acid reflux 02/20/2015  . Headache, menstrual  migraine 02/20/2015  . Depression, major, recurrent, moderate (HCC) 02/20/2015  . Adaptive colitis 02/20/2015  . Headache, migraine 02/20/2015  . Panic disorder with agoraphobia and severe panic attacks 02/20/2015    Past Surgical History:  Procedure Laterality Date  . EYE SURGERY    . MOUTH SURGERY  2010  . STRABISMUS SURGERY  1997  . TONSILLECTOMY  2003  . tubes in ear  2000     OB History    Gravida  1   Para  1   Term      Preterm  1   AB      Living  1     SAB      TAB      Ectopic      Multiple  0   Live Births  1           Family History  Problem Relation Age of Onset  . Depression Mother   . Anxiety disorder Mother   . Hypertension Mother   . Alcohol abuse Father   . Hypertension Father   . Kidney disease Father   . Bipolar disorder Maternal Aunt   . Cancer Maternal Aunt        breast  . Hypertension Maternal Aunt   . Diabetes Maternal Aunt   .  Bipolar disorder Maternal Uncle   . Hypertension Maternal Uncle   . Bipolar disorder Paternal Uncle   . Depression Maternal Grandmother   . Stroke Maternal Grandmother   . Hypertension Maternal Grandmother   . Cancer Maternal Grandmother        breast  . Heart disease Maternal Grandmother   . Depression Paternal Grandmother   . Bipolar disorder Paternal Grandmother   . Kidney disease Paternal Grandmother   . Fibromyalgia Paternal Grandmother   . ADD / ADHD Cousin   . Depression Cousin   . Heart attack Maternal Aunt   . Heart disease Maternal Aunt   . Hypertension Maternal Aunt   . Heart attack Maternal Uncle   . Heart disease Maternal Uncle   . Diabetes Maternal Uncle   . Stroke Maternal Grandfather   . Hypertension Maternal Grandfather   . Heart disease Maternal Grandfather   . Healthy Brother   . Healthy Brother   . Healthy Daughter     Social History   Tobacco Use  . Smoking status: Never Smoker  . Smokeless tobacco: Never Used  Vaping Use  . Vaping Use: Never used    Substance Use Topics  . Alcohol use: No  . Drug use: No    Home Medications Prior to Admission medications   Medication Sig Start Date End Date Taking? Authorizing Provider  clindamycin (CLEOCIN) 150 MG capsule Take 1 capsule (150 mg total) by mouth 3 (three) times daily. Patient not taking: Reported on 05/09/2020 04/25/20   Maple Hudson., MD  diltiazem (CARDIZEM SR) 60 MG 12 hr capsule Take 1 capsule (60 mg total) by mouth 2 (two) times daily. 06/26/20   Maple Hudson., MD  ferrous sulfate 325 (65 FE) MG tablet Take 650 mg by mouth every other day.    [provider]  metoprolol tartrate (LOPRESSOR) 50 MG tablet Take 1 tablet (50 mg total) by mouth 2 (two) times daily. PLEASE SCHEDULE OFFICE VISIT FOR FURTHER REFILLS. THANK YOU! 07/10/20 10/08/20  Debbe Odea, MD  verapamil (CALAN-SR) 120 MG CR tablet Take 1 tablet (120 mg total) by mouth at bedtime. 06/28/20   Maple Hudson., MD  VITAMIN D PO Take 1,000 Units by mouth daily.    [provider]    Allergies    Amoxicillin, Latex, Penicillins, and Red dye  Review of Systems   Review of Systems  All other systems reviewed and are negative.   Physical Exam Updated Vital Signs BP 128/78 (BP Location: Right Arm)   Pulse 85   Temp 98.4 F (36.9 C) (Oral)   Resp 16   Ht 1.626 m (5\' 4" )   Wt 93.4 kg   SpO2 100%   BMI 35.36 kg/m   Physical Exam Vitals and nursing note reviewed.  Constitutional:      General: She is not in acute distress.    Appearance: She is well-developed.  HENT:     Head: Normocephalic and atraumatic.     Right Ear: External ear normal.     Left Ear: External ear normal.  Eyes:     General: No scleral icterus.       Right eye: No discharge.        Left eye: No discharge.     Extraocular Movements: Extraocular movements intact.     Conjunctiva/sclera: Conjunctivae normal.     Pupils: Pupils are equal, round, and reactive to light.  Neck:     Trachea: No  tracheal  deviation.  Cardiovascular:     Rate and Rhythm: Normal rate and regular rhythm.  Pulmonary:     Effort: Pulmonary effort is normal. No respiratory distress.     Breath sounds: Normal breath sounds. No stridor. No wheezing or rales.  Abdominal:     General: Bowel sounds are normal. There is no distension.     Palpations: Abdomen is soft.     Tenderness: There is no abdominal tenderness. There is no guarding or rebound.  Musculoskeletal:        General: No tenderness.     Cervical back: Neck supple.  Skin:    General: Skin is warm and dry.     Findings: No rash.  Neurological:     Mental Status: She is alert and oriented to person, place, and time.     Cranial Nerves: No cranial nerve deficit (No facial droop, extraocular movements intact, tongue midline ).     Sensory: No sensory deficit.     Motor: No abnormal muscle tone or seizure activity.     Coordination: Coordination normal.     Comments: No pronator drift bilateral upper extrem, able to hold both legs off bed for 5 seconds, sensation intact in all extremities, no visual field cuts, no left or right sided neglect, normal finger-nose exam bilaterally, no nystagmus noted      ED Results / Procedures / Treatments   Labs (all labs ordered are listed, but only abnormal results are displayed) Labs Reviewed - No data to display  EKG None  Radiology CT Head Wo Contrast  Result Date: 07/23/2020 CLINICAL DATA:  Headache above RIGHT by since 1030 hrs this morning, peripheral blurred vision greater on LEFT, photosensitivity, history of migraines EXAM: CT HEAD WITHOUT CONTRAST TECHNIQUE: Contiguous axial images were obtained from the base of the skull through the vertex without intravenous contrast. Sagittal and coronal MPR images reconstructed from axial data set. COMPARISON:  04/18/2014 FINDINGS: Brain: Normal ventricular morphology. No midline shift or mass effect. Normal appearance of brain parenchyma. No intracranial  hemorrhage, mass lesion, evidence of acute infarction, or extra-axial fluid collection. Vascular: No hyperdense vessels Skull: Intact Sinuses/Orbits: Clear Other: N/A IMPRESSION: Normal exam. Electronically Signed   By: Ulyses Southward M.D.   On: 07/23/2020 16:02    Procedures Procedures (including critical care time)  Medications Ordered in ED Medications  prochlorperazine (COMPAZINE) injection 10 mg (10 mg Intravenous Given 07/23/20 1615)  ketorolac (TORADOL) 30 MG/ML injection 30 mg (30 mg Intravenous Given 07/23/20 1615)  diphenhydrAMINE (BENADRYL) injection 12.5 mg (12.5 mg Intravenous Given 07/23/20 1613)    ED Course  I have reviewed the triage vital signs and the nursing notes.  Pertinent labs & imaging results that were available during my care of the patient were reviewed by me and considered in my medical decision making (see chart for details).  Clinical Course as of Jul 23 1728  Wynelle Link Jul 23, 2020  1605 Head CT without acute changes   [JK]  1726 Patient symptoms have improved.  She is feeling better now.  Head CT findings reviewed.  No acute changes.   [JK]    Clinical Course User Index [JK] Linwood Dibbles, MD   MDM Rules/Calculators/A&P                          Patient presented to the ED for evaluation of a headache.  Patient's physical exam is reassuring.  No acute neurologic findings noted.  With her  visual complaints a CT scan was performed this does not show any acute changes.  Patient symptoms improved after migraine cocktail.  I suspect visual complaints for more pain.  At 1730 hrs. patient indicates she was feeling better and ready to go home. Final Clinical Impression(s) / ED Diagnoses Final diagnoses:  Migraine with aura and without status migrainosus, not intractable    Rx / DC Orders ED Discharge Orders    None       Linwood DibblesKnapp, Wealthy Danielski, MD 07/23/20 1730

## 2020-07-23 NOTE — ED Triage Notes (Addendum)
Pt with hx of migraines c/o HA above RT eye that began around 1030 this morning. Pt states when she woke up this morning, her peripheral vision was blurry, LT worst than right. Also endorses photosensitivity. Denies recent head injury. AOx4.

## 2020-08-11 ENCOUNTER — Telehealth (INDEPENDENT_AMBULATORY_CARE_PROVIDER_SITE_OTHER): Payer: 59 | Admitting: Family Medicine

## 2020-08-11 ENCOUNTER — Encounter: Payer: Self-pay | Admitting: Family Medicine

## 2020-08-11 VITALS — Temp 97.7°F | Wt 200.0 lb

## 2020-08-11 DIAGNOSIS — J0140 Acute pansinusitis, unspecified: Secondary | ICD-10-CM

## 2020-08-11 MED ORDER — DOXYCYCLINE HYCLATE 100 MG PO CAPS: 100.0000 mg | ORAL_CAPSULE | Freq: Two times a day (BID) | ORAL | 0 refills | Status: DC

## 2020-08-11 NOTE — Progress Notes (Signed)
MyChart Video Visit    Virtual Visit via Video Note   This visit type was conducted due to national recommendations for restrictions regarding the COVID-19 Pandemic (e.g. social distancing) in an effort to limit this patient's exposure and mitigate transmission in our community. This patient is at least at moderate risk for complications without adequate follow up. This format is felt to be most appropriate for this patient at this time. Physical exam was limited by quality of the video and audio technology used for the visit.   Patient location: Home Provider location: Office  I discussed the limitations of evaluation and management by telemedicine and the availability of in person appointments. The patient expressed understanding and agreed to proceed.  Patient: Lisa Grimes   DOB: 01/26/1996   24 y.o. Female  MRN: [id number] Visit Date: 08/11/2020  Today's healthcare provider: Dortha Kern, PA   Chief Complaint  Patient presents with  . Sinus Problem   Subjective    Sinus Problem This is a new problem. The current episode started in the past 7 days. The problem has been gradually worsening since onset. There has been no fever. Associated symptoms include congestion, ear pain (and fullness), headaches, sinus pressure, sneezing, a sore throat and swollen glands. Pertinent negatives include no chills, coughing, diaphoresis, hoarse voice, neck pain or shortness of breath. Treatments tried: nsaids.     Patient Active Problem List   Diagnosis Date Noted  . Preeclampsia, severe, third trimester 06/13/2017  . Gestational hypertension 06/05/2017  . Supervision of high risk pregnancy, antepartum 04/22/2017  . Depression with anxiety 04/22/2017  . UTI (urinary tract infection) during pregnancy, first trimester 04/22/2017  . Anxiety 02/20/2015  . Fatigue 02/20/2015  . Acid reflux 02/20/2015  . Headache, menstrual migraine 02/20/2015  . Depression, major, recurrent, moderate  (HCC) 02/20/2015  . Adaptive colitis 02/20/2015  . Headache, migraine 02/20/2015  . Panic disorder with agoraphobia and severe panic attacks 02/20/2015   Past Medical History:  Diagnosis Date  . Abnormal white blood cell count   . Acute bronchitis   . Anemia    per patient   . Anxiety   . Chlamydia 2016  . Depression with anxiety   . Dye allergic reaction   . Epistaxis   . Fatigue   . Gastroesophageal reflux disease   . Hypoglycemia   . IBS (irritable bowel syndrome)    per patient   . Influenza A   . Insomnia   . Major depressive disorder, recurrent episode, moderate with anxious distress (HCC)   . Menstrual headache   . Migraine headache   . Mild major depression (HCC)   . Palpitations   . Panic disorder with agoraphobia and severe panic attacks   . Parent-child relational problem   . Rhinitis, allergic   . Screening for depression   . Sinus tachycardia   . Viral syndrome   . Vitamin D deficiency    per patient    Allergies  Allergen Reactions  . Amoxicillin Hives    Has patient had a PCN reaction causing immediate rash, facial/tongue/throat swelling, SOB or lightheadedness with hypotension: Yes Has patient had a PCN reaction causing severe rash involving mucus membranes or skin necrosis: No Has patient had a PCN reaction that required hospitalization No Has patient had a PCN reaction occurring within the last 10 years: No If all of the above answers are "NO", then may proceed with Cephalosporin use.   . Latex   . Penicillins Hives  Has patient had a PCN reaction causing immediate rash, facial/tongue/throat swelling, SOB or lightheadedness with hypotension: Yes Has patient had a PCN reaction causing severe rash involving mucus membranes or skin necrosis: No Has patient had a PCN reaction that required hospitalization No Has patient had a PCN reaction occurring within the last 10 years: no If all of the above answers are "NO", then may proceed with Cephalosporin  use.   . Red Dye     possible allergy     Past Surgical History:  Procedure Laterality Date  . EYE SURGERY    . MOUTH SURGERY  2010  . STRABISMUS SURGERY  1997  . TONSILLECTOMY  2003  . tubes in ear  2000   Family History  Problem Relation Age of Onset  . Depression Mother   . Anxiety disorder Mother   . Hypertension Mother   . Alcohol abuse Father   . Hypertension Father   . Kidney disease Father   . Bipolar disorder Maternal Aunt   . Cancer Maternal Aunt        breast  . Hypertension Maternal Aunt   . Diabetes Maternal Aunt   . Bipolar disorder Maternal Uncle   . Hypertension Maternal Uncle   . Bipolar disorder Paternal Uncle   . Depression Maternal Grandmother   . Stroke Maternal Grandmother   . Hypertension Maternal Grandmother   . Cancer Maternal Grandmother        breast  . Heart disease Maternal Grandmother   . Depression Paternal Grandmother   . Bipolar disorder Paternal Grandmother   . Kidney disease Paternal Grandmother   . Fibromyalgia Paternal Grandmother   . ADD / ADHD Cousin   . Depression Cousin   . Heart attack Maternal Aunt   . Heart disease Maternal Aunt   . Hypertension Maternal Aunt   . Heart attack Maternal Uncle   . Heart disease Maternal Uncle   . Diabetes Maternal Uncle   . Stroke Maternal Grandfather   . Hypertension Maternal Grandfather   . Heart disease Maternal Grandfather   . Healthy Brother   . Healthy Brother   . Healthy Daughter      Medications: Outpatient Medications Prior to Visit  Medication Sig  . ferrous sulfate 325 (65 FE) MG tablet Take 650 mg by mouth every other day.  . verapamil (CALAN-SR) 120 MG CR tablet Take 1 tablet (120 mg total) by mouth at bedtime.  Marland Kitchen VITAMIN D PO Take 1,000 Units by mouth daily.  . clindamycin (CLEOCIN) 150 MG capsule Take 1 capsule (150 mg total) by mouth 3 (three) times daily. (Patient not taking: Reported on 05/09/2020)  . diltiazem (CARDIZEM SR) 60 MG 12 hr capsule Take 1 capsule (60  mg total) by mouth 2 (two) times daily. (Patient not taking: Reported on 08/11/2020)  . metoprolol tartrate (LOPRESSOR) 50 MG tablet Take 1 tablet (50 mg total) by mouth 2 (two) times daily. PLEASE SCHEDULE OFFICE VISIT FOR FURTHER REFILLS. THANK YOU! (Patient not taking: Reported on 08/11/2020)   No facility-administered medications prior to visit.    Review of Systems  Constitutional: Negative for chills and diaphoresis.  HENT: Positive for congestion, ear pain (and fullness), sinus pressure, sneezing and sore throat. Negative for hoarse voice.   Respiratory: Negative for cough and shortness of breath.   Musculoskeletal: Negative for neck pain.  Neurological: Positive for headaches.    Objective    Temp 97.7 F (36.5 C) (Oral)   Wt 200 lb (90.7 kg)  BMI 34.33 kg/m    Physical Exam  WDWN female in no apparent distress.  Head: Normocephalic, atraumatic. Points to tenderness in frontal and maxillary sinus area. Neck: Supple, NROM Respiratory: No apparent distress Psych: Normal mood and affect     Assessment & Plan     1. Subacute pansinusitis Developed frontal and maxillary sinus pressure tenderness and PND over the past 3 days. Some PND with burning in throat. No fever or chills. Had COVID infection Dec. 2020 - no similar symptoms. Will treat with antibiotic, Mucinex and Flonase Nasal Spray. Increase fluids and may [place] Advil or Tylenol prn headache. Monitor temperature and consider COVID test if symptoms appear during the weekend. - doxycycline (VIBRAMYCIN) 100 MG capsule; Take 1 capsule (100 mg total) by mouth 2 (two) times daily.  Dispense: 20 capsule; Refill: 0   No follow-ups on file.     I discussed the assessment and treatment plan with the patient. The patient was provided an opportunity to ask questions and all were answered. The patient agreed with the plan and demonstrated an understanding of the instructions.   The patient was advised to call back or seek an  in-person evaluation if the symptoms worsen or if the condition fails to improve as anticipated.  I provided 20 minutes of non-face-to-face time during this encounter.  Haywood Pao, PA, have reviewed all documentation for this visit. The documentation on 08/11/20 for the exam, diagnosis, procedures, and orders are all accurate and complete.   Dortha Kern, PA [facility] [phone] (phone) [phone] (fax)  [facility]

## 2020-09-12 ENCOUNTER — Ambulatory Visit (INDEPENDENT_AMBULATORY_CARE_PROVIDER_SITE_OTHER): Payer: 59 | Admitting: Family

## 2020-09-12 ENCOUNTER — Encounter: Payer: Self-pay | Admitting: Family

## 2020-09-12 ENCOUNTER — Other Ambulatory Visit: Payer: Self-pay

## 2020-09-12 VITALS — BP 128/88 | HR 87 | Ht 64.0 in | Wt 201.0 lb

## 2020-09-12 DIAGNOSIS — R000 Tachycardia, unspecified: Secondary | ICD-10-CM

## 2020-09-12 DIAGNOSIS — I7300 Raynaud's syndrome without gangrene: Secondary | ICD-10-CM | POA: Diagnosis not present

## 2020-09-12 DIAGNOSIS — R002 Palpitations: Secondary | ICD-10-CM | POA: Diagnosis not present

## 2020-09-12 MED ORDER — DILTIAZEM HCL ER COATED BEADS 120 MG PO CP24: 120.0000 mg | ORAL_CAPSULE | Freq: Every day | ORAL | 1 refills | Status: DC

## 2020-09-12 NOTE — Progress Notes (Signed)
Office Visit    Patient Name: Lisa Grimes Date of Encounter: 09/12/2020  Primary Care Provider:  Maple Hudson., MD Primary Cardiologist:  Debbe Odea, MD Electrophysiologist:  None   Chief Complaint    Lisa Grimes is a 24 y.o. female with a hx of anxiety, palpitations presents today for follow-up of palpitations  Past Medical History    Past Medical History:  Diagnosis Date  . Abnormal white blood cell count   . Acute bronchitis   . Anemia    per patient   . Anxiety   . Chlamydia 2016  . Depression with anxiety   . Dye allergic reaction   . Epistaxis   . Fatigue   . Gastroesophageal reflux disease   . Hypoglycemia   . IBS (irritable bowel syndrome)    per patient   . Influenza A   . Insomnia   . Major depressive disorder, recurrent episode, moderate with anxious distress (HCC)   . Menstrual headache   . Migraine headache   . Mild major depression (HCC)   . Palpitations   . Panic disorder with agoraphobia and severe panic attacks   . Parent-child relational problem   . Rhinitis, allergic   . Screening for depression   . Sinus tachycardia   . Viral syndrome   . Vitamin D deficiency    per patient    Past Surgical History:  Procedure Laterality Date  . EYE SURGERY    . MOUTH SURGERY  2010  . STRABISMUS SURGERY  1997  . TONSILLECTOMY  2003  . tubes in ear  2000    Allergies  Allergies  Allergen Reactions  . Amoxicillin Hives    Has patient had a PCN reaction causing immediate rash, facial/tongue/throat swelling, SOB or lightheadedness with hypotension: Yes Has patient had a PCN reaction causing severe rash involving mucus membranes or skin necrosis: No Has patient had a PCN reaction that required hospitalization No Has patient had a PCN reaction occurring within the last 10 years: No If all of the above answers are "NO", then may proceed with Cephalosporin use.   . Latex   . Penicillins Hives    Has patient had a PCN reaction  causing immediate rash, facial/tongue/throat swelling, SOB or lightheadedness with hypotension: Yes Has patient had a PCN reaction causing severe rash involving mucus membranes or skin necrosis: No Has patient had a PCN reaction that required hospitalization No Has patient had a PCN reaction occurring within the last 10 years: no If all of the above answers are "NO", then may proceed with Cephalosporin use.   . Red Dye     possible allergy    History of Present Illness    Lisa Grimes is a 24 y.o. female with a hx of palpitations, anxiety, positive ANA, Raynaud's last seen 01/10/2020 by Dr. Azucena Cecil.  She was seen in consult 09/2019 by Dr. Azucena Cecil for palpitations and recommended for ZIO monitor.  She previously had been started on Lopressor 25 mg twice daily by her primary care provider.  Subsequent ZIO monitor with predominantly normal sinus rhythm, ectopic atrial rhythm also present.  Minimum heart rate 43 bpm, maximum heart rate 176 bpm, average heart rate 76 bpm.  Rare PVC/PAC.  Patient triggered events were assisted with sinus rhythm.  Seen in follow-up 10/12/2019 and referred to EP.  Seen by Dr. Graciela Husbands via telemedicine 10/22/2019.  On his review noted stable P wave morphology without discontinuity making atrial tachycardia unlikely and  suspected exaggerated sinus arrhythmia complicated by hypersensitivity to heart rate changes.  She was recommended continue metoprolol.  Recommended for follow-up as needed with EP.  When seen by Dr. Azucena Cecil 11/08/19 she was transitioned from Lopressor to Toprol.  Orthostatic vitals with heart rate increased by 8-12 bpm but low suspicion POTS. Seen in follow up 01/10/20 and transitioned back to Lopressor as she reported Toprol was not as effective.  Seen by her PCP 06/26/20 via telemedicine. She was transitioned from Metoprolol to Verapamil.  Diltiazem was initially recommended but it was not covered by her insurance.  She presents today for  follow-up.  Reports since starting the verapamil she feels her palpitations are worse and that she is constipated.  She also reports history of IBS and feels this is exacerbating her symptoms.  She is not using any over-the-counter laxatives. Tells me she notices sensation of her heart racing first thing in the morning. She takes her Verapamil at night. Reports some dyspnea on exertion which is stable at her baseline.  Reports no change.  She has lost 9 pounds since last seen 6 months ago.  She tells me she has been more careful about what she is eating.  She also stays active watching her 40-year-old daughter at home.  She drinks approximately 64 ounces of water per day.  She drinks sweet tea only when she goes out to eat.  She is following with rheumatology for Raynaud's and was recommended to transition from metoprolol to alternative agent.  We discussed that calcium channel blockers are often used to help prevent symptoms of Raynaud's.  She tells me she feels her symptoms are worse.  Her fingers are blue though not painful.  She is having repeat testing for lupus with rheumatology in December due to elevated ANA.  EKGs/Labs/Other Studies Reviewed:   The following studies were reviewed today:  2-week cardiac monitor date 10/06/2019 Patient had a min HR of 43 bpm, max HR of 176 bpm, and avg HR of 76 bpm. Predominant underlying rhythm was Sinus Rhythm. Ectopic Atrial Rhythm was present. Isolated SVEs were rare (<1.0%), SVE Couplets were rare (<1.0%), and no SVE Triplets were present. Isolated VEs were rare (<1.0%), and no VE Couplets or VE Triplets were present.  Patient triggered events/symptoms were associated with sinus rhythm.  EKG:  EKG is  ordered today.  The ekg ordered today demonstrates NSR 87 bpm with nonspecific T wave changes and no acute ST/T wave changes.   Recent Labs: No results found for requested labs within last 8760 hours.  Recent Lipid Panel No results found for: CHOL,  TRIG, HDL, CHOLHDL, VLDL, LDLCALC, LDLDIRECT   Home Medications   Current Meds  Medication Sig  . ibuprofen (ADVIL) 600 MG tablet Take 600 mg by mouth every 6 (six) hours as needed.  Marland Kitchen VITAMIN D PO Take 1,000 Units by mouth daily.  . [DISCONTINUED] verapamil (CALAN-SR) 120 MG CR tablet Take 1 tablet (120 mg total) by mouth at bedtime.     Review of Systems  All other systems reviewed and are otherwise negative except as noted above.  Physical Exam    VS:  BP 128/88 (BP Location: Left Arm, Patient Position: Sitting, Cuff Size: Normal)   Pulse 87   Ht 5\' 4"  (1.626 m)   Wt 201 lb (91.2 kg)   SpO2 99%   BMI 34.50 kg/m  , BMI Body mass index is 34.5 kg/m.  Wt Readings from Last 3 Encounters:  09/12/20 201 lb (91.2 kg)  08/11/20 200 lb (90.7 kg)  07/23/20 206 lb (93.4 kg)    GEN: Well nourished, well developed, in no acute distress. HEENT: normal. Neck: Supple, no JVD, carotid bruits, or masses. Cardiac: RRR, no murmurs, rubs, or gallops. No clubbing, cyanosis, edema.  Radials/DP/PT 2+ and equal bilaterally.  Respiratory:  Respirations regular and unlabored, clear to auscultation bilaterally. GI: Soft, nontender, nondistended. MS: No deformity or atrophy. Skin: Warm and dry, no rash. Neuro:  Strength and sensation are intact. Psych: Normal affect.  Assessment & Plan    1. Palpitations/Tachycardia -EKG today sinus rhythm 87 bpm with no acute ST/T wave changes.  Reports tachycardia in the morning upon waking.  She takes her verapamil at night.  Previously on metoprolol though should this was discontinued as she did not feel it was very effective and in the setting of Raynaud's.  She has been taking verapamil 120 mg daily for approximately 2 months.  Does note some mild constipation with CCB and was encouraged to try MiraLAX.  She is interested in trying alternative agent.  Does not feel her tachypalpitations are well controlled.  Stop verapamil, start diltiazem 120 mg daily.  We  will send to [facility] and she will use good Rx coupon.  Encouraged to continue avoid caffeine.  We discussed that a regular cardiovascular exercise program would help reduce incidence of tachycardia.  2. Raynaud's -Continue to follow with rheumatology.  She is interested in going back on beta-blocker though we discussed this would not be recommended in the setting of Raynaud's.  As such, transition to alternative calcium channel blocker, as above.  3. Anxiety-continue to follow with PCP.  Disposition: Follow up in 6 month(s) with Dr. Azucena Cecil or APP.   Signed, Alver Sorrow, NP 09/12/2020, 4:07 PM [facility]

## 2020-09-12 NOTE — Patient Instructions (Addendum)
Medication Instructions:  Your physician has recommended you make the following change in your medication:   STOP Verapamil  START Diltiazem 120mg  once daily  *If you need a refill on your cardiac medications before your next appointment, please call your pharmacy*   Lab Work: None ordered today.  Testing/Procedures: Your EKG today shows normal sinus rhythm.   Follow-Up: At [facility], you and your health needs are our priority.  As part of our continuing mission to provide you with exceptional heart care, we have created designated Provider Care Teams.  These Care Teams include your primary Cardiologist (physician) and Advanced Practice Providers (APPs -  Physician Assistants and Nurse Practitioners) who all work together to provide you with the care you need, when you need it.  We recommend signing up for the patient portal called "MyChart".  Sign up information is provided on this After Visit Summary.  MyChart is used to connect with patients for Virtual Visits (Telemedicine).  Patients are able to view lab/test results, encounter notes, upcoming appointments, etc.  Non-urgent messages can be sent to your provider as well.   To learn more about what you can do with MyChart, go to [facility].    Your next appointment:   6 month(s)  The format for your next appointment:   In Person  Provider:   You may see [email or web address], MD or one of the following Advanced Practice Providers on your designated Care Team:    Debbe Odea, NP  Nicolasa Ducking, NP  Gillian Shields, PA-C  Eula Listen, PA-C  Cadence Marisue Ivan, Fransico Michael  Other Instructions   Palpitations Palpitations are feelings that your heartbeat is not normal. Your heartbeat may feel like it is:  Uneven.  Faster than normal.  Fluttering.  Skipping a beat. This is usually not a serious problem. In some cases, you may need tests to rule out any serious problems. Follow these instructions at  home: Pay attention to any changes in your condition. Take these actions to help manage your symptoms: Eating and drinking  Avoid: ? Coffee, tea, soft drinks, and energy drinks. ? Chocolate. ? Alcohol. ? Diet pills. Lifestyle   Try to lower your stress. These things can help you relax: ? Yoga. ? Deep breathing and meditation. ? Exercise. ? Using words and images to create positive thoughts (guided imagery). ? Using your mind to control things in your body (biofeedback).  Do not use drugs.  Get plenty of rest and sleep. Keep a regular bed time. General instructions   Take over-the-counter and prescription medicines only as told by your doctor.  Do not use any products that contain nicotine or tobacco, such as cigarettes and e-cigarettes. If you need help quitting, ask your doctor.  Keep all follow-up visits as told by your doctor. This is important. You may need more tests if palpitations do not go away or get worse. Contact a doctor if:  Your symptoms last more than 24 hours.  Your symptoms occur more often. Get help right away if you:  Have chest pain.  Feel short of breath.  Have a very bad headache.  Feel dizzy.  Pass out (faint). Summary  Palpitations are feelings that your heartbeat is uneven or faster than normal. It may feel like your heart is fluttering or skipping a beat.  Avoid food and drinks that may cause palpitations. These include caffeine, chocolate, and alcohol.  Try to lower your stress. Do not smoke or use drugs.  Get help right away if you  faint or have chest pain, shortness of breath, a severe headache, or dizziness. This information is not intended to replace advice given to you by your health care provider. Make sure you discuss any questions you have with your health care provider. Document Revised: 12/10/2017 Document Reviewed: 12/10/2017 Elsevier Patient Education  2020 [employer].

## 2020-09-25 ENCOUNTER — Encounter: Payer: Self-pay | Admitting: Family

## 2020-10-02 ENCOUNTER — Telehealth: Payer: Self-pay | Admitting: Rheumatology

## 2020-10-02 NOTE — Telephone Encounter (Signed)
AVISE form is ready to be picked up at the front desk. I attempted to contact patient and left message on machine to advise patient.

## 2020-10-02 NOTE — Telephone Encounter (Signed)
Patient states she is having bilateral shoulder pain, swollen lymph nodes and patient reports her rib cage feel as though it is on fire. Patient has also had a migraine recently which required a trip to the ED. Patient reports her raynaud's has worsened and she has been wearing gloves as much as possible. Patient states she was told in June to come back to the office prior to the 11/14/2020 appointment to get a new AVISE order and have them drawn. Patient would like to know if she should go ahead and have the labs drawn based on her symptoms she is currently experiencing. Please advise.

## 2020-10-02 NOTE — Telephone Encounter (Signed)
Patient left a message ststing she is having worse symptoms. Raynaud's seem to be getting worse, and ribcage seems to be on fire. Patient wants to know if she should repeat tests sooner because of this? Please call to advise.

## 2020-10-02 NOTE — Telephone Encounter (Signed)
Yes, she should get AVISE labs now.

## 2020-10-31 NOTE — Progress Notes (Deleted)
Office Visit Note  Patient: Lisa Grimes             Date of Birth: 12-Nov-1995           MRN: [id number]             PCP: Jerrol Banana., MD Referring: Jerrol Banana.,* Visit Date: 11/14/2020 Occupation: _0 @  Subjective:  No chief complaint on file.   History of Present Illness: Lisa Grimes is a 24 y.o. female ***   Activities of Daily Living:  Patient reports morning stiffness for *** {minute/hour:19697}.   Patient {ACTIONS;DENIES/REPORTS:21021675::"Denies"} nocturnal pain.  Difficulty dressing/grooming: {ACTIONS;DENIES/REPORTS:21021675::"Denies"} Difficulty climbing stairs: {ACTIONS;DENIES/REPORTS:21021675::"Denies"} Difficulty getting out of chair: {ACTIONS;DENIES/REPORTS:21021675::"Denies"} Difficulty using hands for taps, buttons, cutlery, and/or writing: {ACTIONS;DENIES/REPORTS:21021675::"Denies"}  No Rheumatology ROS completed.   PMFS History:  Patient Active Problem List   Diagnosis Date Noted  . Preeclampsia, severe, third trimester 06/13/2017  . Gestational hypertension 06/05/2017  . Supervision of high risk pregnancy, antepartum 04/22/2017  . Depression with anxiety 04/22/2017  . UTI (urinary tract infection) during pregnancy, first trimester 04/22/2017  . Anxiety 02/20/2015  . Fatigue 02/20/2015  . Acid reflux 02/20/2015  . Headache, menstrual migraine 02/20/2015  . Depression, major, recurrent, moderate ([place]) 02/20/2015  . Adaptive colitis 02/20/2015  . Headache, migraine 02/20/2015  . Panic disorder with agoraphobia and severe panic attacks 02/20/2015    Past Medical History:  Diagnosis Date  . Abnormal white blood cell count   . Acute bronchitis   . Anemia    per patient   . Anxiety   . Chlamydia 2016  . Depression with anxiety   . Dye allergic reaction   . Epistaxis   . Fatigue   . Gastroesophageal reflux disease   . Hypoglycemia   . IBS (irritable bowel syndrome)    per patient   . Influenza A   . Insomnia   .  Major depressive disorder, recurrent episode, moderate with anxious distress ([place])   . Menstrual headache   . Migraine headache   . Mild major depression ([place])   . Palpitations   . Panic disorder with agoraphobia and severe panic attacks   . Parent-child relational problem   . Rhinitis, allergic   . Screening for depression   . Sinus tachycardia   . Viral syndrome   . Vitamin D deficiency    per patient     Family History  Problem Relation Age of Onset  . Depression Mother   . Anxiety disorder Mother   . Hypertension Mother   . Alcohol abuse Father   . Hypertension Father   . Kidney disease Father   . Bipolar disorder Maternal Aunt   . Cancer Maternal Aunt        breast  . Hypertension Maternal Aunt   . Diabetes Maternal Aunt   . Bipolar disorder Maternal Uncle   . Hypertension Maternal Uncle   . Bipolar disorder Paternal Uncle   . Depression Maternal Grandmother   . Stroke Maternal Grandmother   . Hypertension Maternal Grandmother   . Cancer Maternal Grandmother        breast  . Heart disease Maternal Grandmother   . Depression Paternal Grandmother   . Bipolar disorder Paternal Grandmother   . Kidney disease Paternal Grandmother   . Fibromyalgia Paternal Grandmother   . ADD / ADHD Cousin   . Depression Cousin   . Heart attack Maternal Aunt   . Heart disease Maternal Aunt   . Hypertension  Maternal Aunt   . Heart attack Maternal Uncle   . Heart disease Maternal Uncle   . Diabetes Maternal Uncle   . Stroke Maternal Grandfather   . Hypertension Maternal Grandfather   . Heart disease Maternal Grandfather   . Healthy Brother   . Healthy Brother   . Healthy Daughter    Past Surgical History:  Procedure Laterality Date  . EYE SURGERY    . MOUTH SURGERY  2010  . STRABISMUS SURGERY  1997  . TONSILLECTOMY  2003  . tubes in ear  2000   Social History   Social History Narrative  . Not on file   Immunization History  Administered Date(s) Administered  . DTaP  06/20/1995, 08/20/1995, 10/20/1995, 07/20/1996, 03/24/2000  . Hepatitis A 10/09/2006, 07/16/2007  . Hepatitis B 1995/08/27, 06/20/1995, 10/20/1995  . HiB (PRP-OMP) 06/20/1995, 08/20/1995, 10/20/1995, 07/20/1996  . IPV 06/20/1995, 08/20/1995, 10/20/1995, 03/24/2000  . Influenza-Unspecified 10/09/2006, 08/16/2013  . MMR 07/20/1996, 03/24/2000  . Meningococcal Conjugate 10/09/2006, 08/16/2013  . Tdap 07/16/2007, 06/16/2017  . Varicella 07/20/1996, 10/09/2006     Objective: Vital Signs: There were no vitals taken for this visit.   Physical Exam   Musculoskeletal Exam: ***  CDAI Exam: CDAI Score: -- Patient Global: --; Provider Global: -- Swollen: --; Tender: -- Joint Exam 11/14/2020   No joint exam has been documented for this visit   There is currently no information documented on the homunculus. Go to the Rheumatology activity and complete the homunculus joint exam.  Investigation: No additional findings.  Imaging: No results found.  Recent Labs: Lab Results  Component Value Date   WBC 6.3 09/09/2019   HGB 14.0 09/09/2019   PLT 231 09/09/2019   NA 137 09/09/2019   K 4.1 09/09/2019   CL 102 09/09/2019   CO2 20 09/09/2019   GLUCOSE 104 (H) 09/09/2019   BUN 10 09/09/2019   CREATININE 0.68 09/09/2019   BILITOT 0.4 09/09/2019   ALKPHOS 94 09/09/2019   AST 17 09/09/2019   ALT 11 09/09/2019   PROT 7.0 09/09/2019   ALBUMIN 4.3 09/09/2019   CALCIUM 9.3 09/09/2019   GFRAA 142 09/09/2019    Speciality Comments: No specialty comments available.  Procedures:  No procedures performed Allergies: Amoxicillin, Latex, Penicillins, and Red dye   Assessment / Plan:     Visit Diagnoses: Raynaud's disease without gangrene - History of Raynaud's for many years.  She has low titer positive ANA and weak positive histone antibody.  Positive ANA (antinuclear antibody) - April 24, 2020 lupus index -0.6, ANA 1: 160 nuclear dots, ENA negative, antihistone negative, CB CAP negative,  Jo 1 -, anticardiolipin negative, beta-2 negative,  Medial epicondylitis of both elbows  Chronic pain of both hips  Hx of sinus tachycardia  Preeclampsia, severe, third trimester  Hx of migraines  History of gastroesophageal reflux (GERD)  Adaptive colitis  Depression with anxiety  Orders: No orders of the defined types were placed in this encounter.  No orders of the defined types were placed in this encounter.   Face-to-face time spent with patient was *** minutes. Greater than 50% of time was spent in counseling and coordination of care.  Follow-Up Instructions: No follow-ups on file.   Ofilia Neas, PA-C  Note - This record has been created using Dragon software.  Chart creation errors have been sought, but may not always  have been located. Such creation errors do not reflect on  the standard of medical care.

## 2020-11-09 ENCOUNTER — Encounter: Payer: Self-pay | Admitting: Family Medicine

## 2020-11-09 ENCOUNTER — Other Ambulatory Visit: Payer: Self-pay

## 2020-11-09 ENCOUNTER — Telehealth: Payer: Self-pay

## 2020-11-09 ENCOUNTER — Ambulatory Visit: Payer: 59 | Admitting: Family Medicine

## 2020-11-09 VITALS — BP 125/60 | HR 100 | Temp 98.3°F | Ht 63.0 in | Wt 214.0 lb

## 2020-11-09 DIAGNOSIS — N390 Urinary tract infection, site not specified: Secondary | ICD-10-CM

## 2020-11-09 DIAGNOSIS — R102 Pelvic and perineal pain: Secondary | ICD-10-CM | POA: Diagnosis not present

## 2020-11-09 LAB — POCT URINALYSIS DIPSTICK
Appearance: NORMAL
Bilirubin, UA: NEGATIVE
Glucose, UA: NEGATIVE
Leukocytes, UA: NEGATIVE
Nitrite, UA: NEGATIVE
Protein, UA: NEGATIVE
Spec Grav, UA: 1.01 (ref 1.010–1.025)
Urobilinogen, UA: 0.2 E.U./dL
pH, UA: 5 (ref 5.0–8.0)

## 2020-11-09 LAB — POCT URINE PREGNANCY: Preg Test, Ur: NEGATIVE

## 2020-11-09 IMAGING — CT CT HEAD W/O CM
3 series · 16 of 47 positions shown, 19 images · non-contrast
Comparison: 04/18/2014

CLINICAL DATA: Headache above RIGHT by since 5151 hrs this morning,
peripheral blurred vision greater on LEFT, photosensitivity, history
of migraines

EXAM:
CT HEAD WITHOUT CONTRAST
TECHNIQUE: Contiguous axial images were obtained from the base of the skull
through the vertex without intravenous contrast. Sagittal and
coronal MPR images reconstructed from axial data set.

[Series 2: head w o · axial · 0.43mm/px · z∈[-9,+116]mm · 10 of 31 slices shown, 13 images]
[im 3/31  brain]
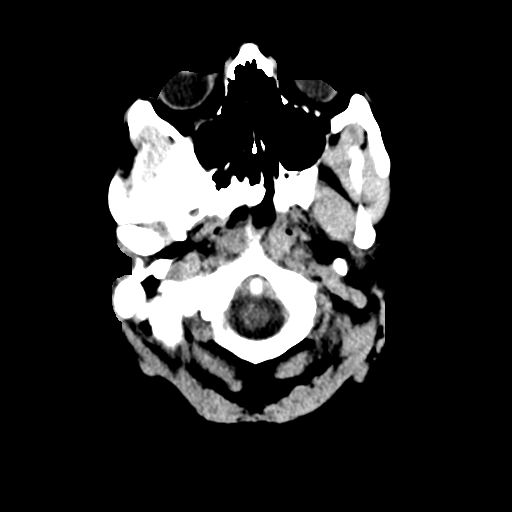
[im 3/31  bone]
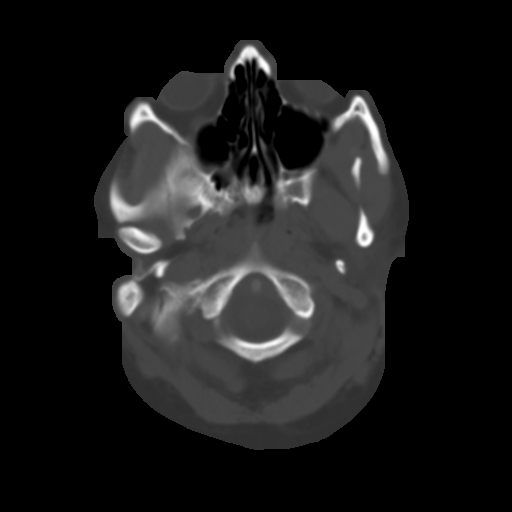
[im 6/31  brain]
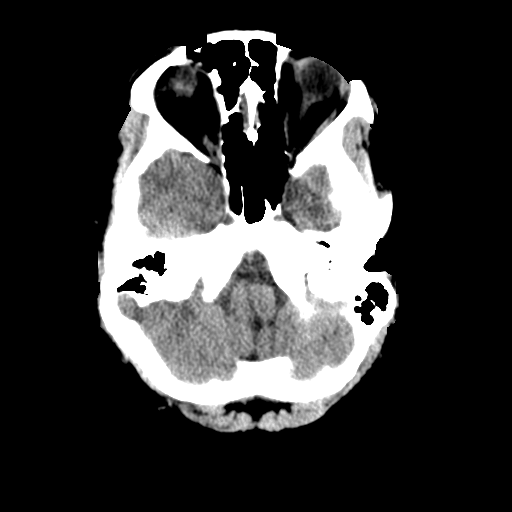
[im 9/31  brain]
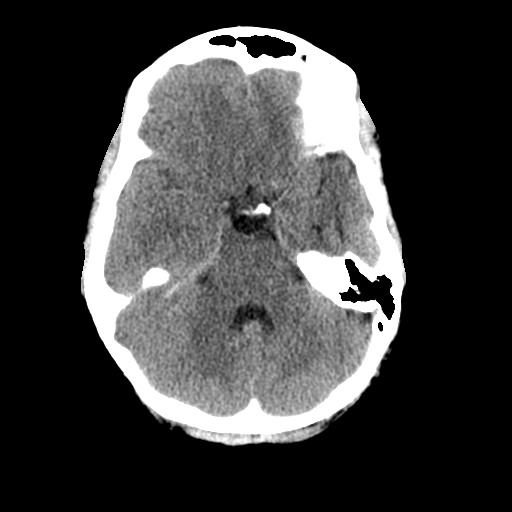
[im 11/31  brain]
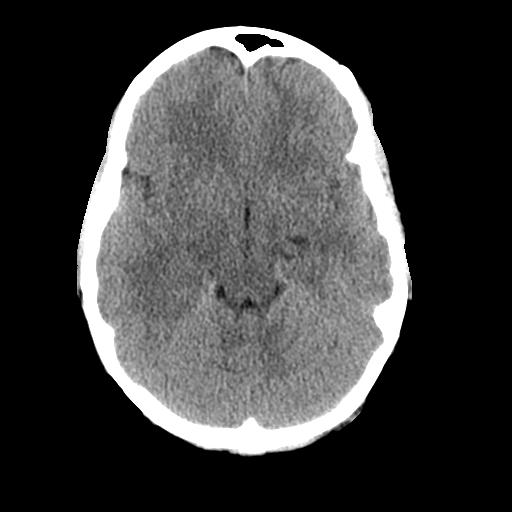
[im 14/31  brain]
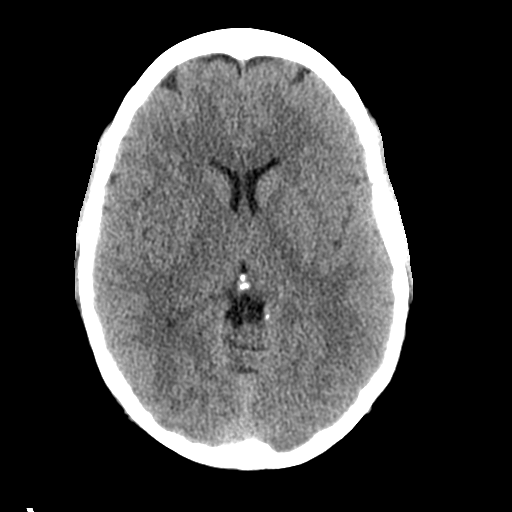
[im 14/31  bone]
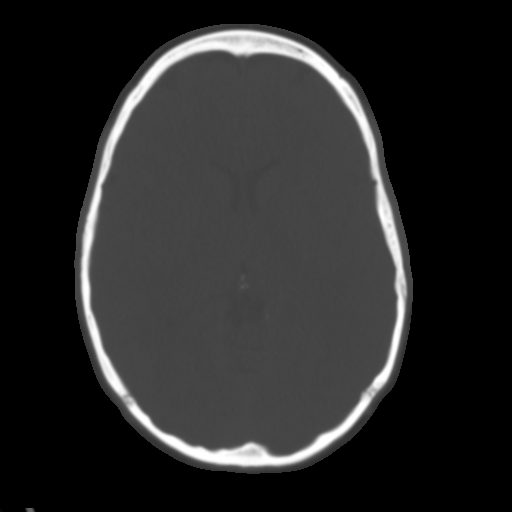
[im 17/31  brain]
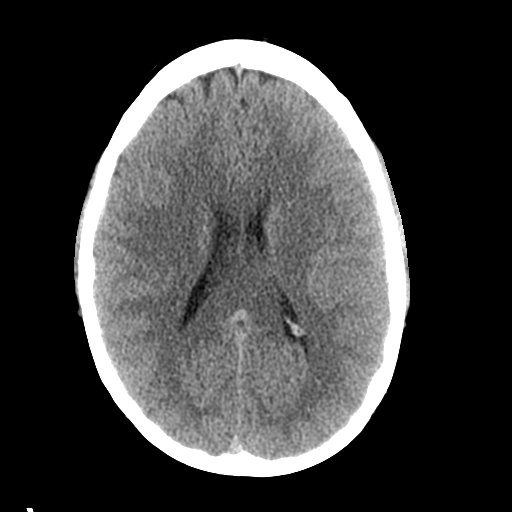
[im 20/31  brain]
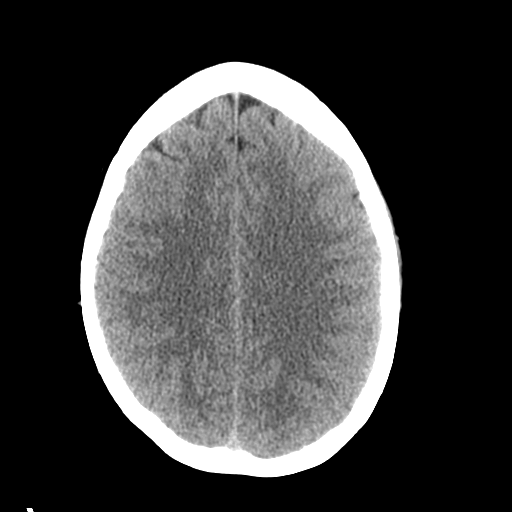
[im 23/31  brain]
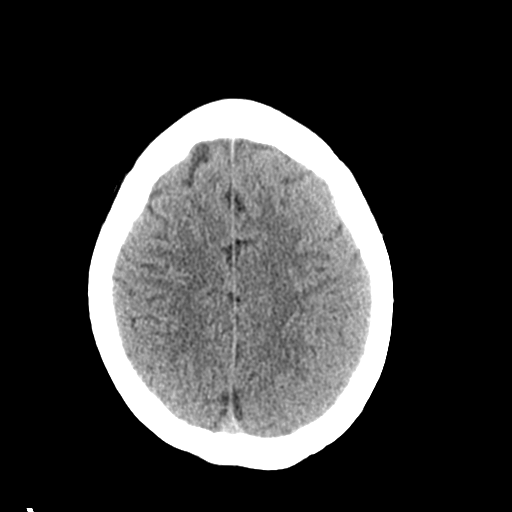
[im 25/31  brain]
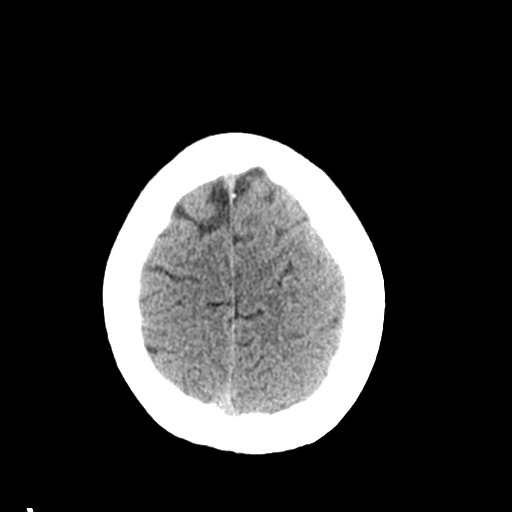
[im 25/31  bone]
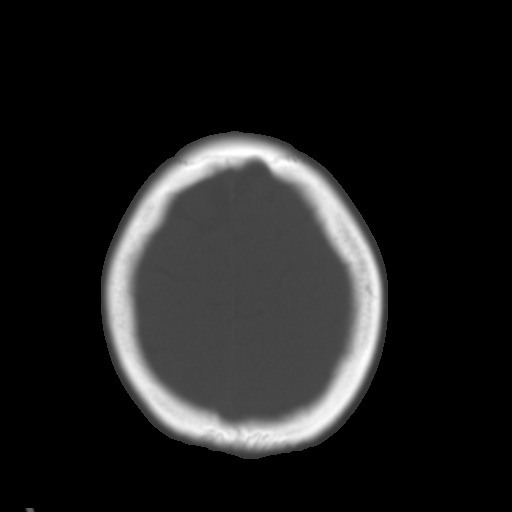
[im 28/31  brain]
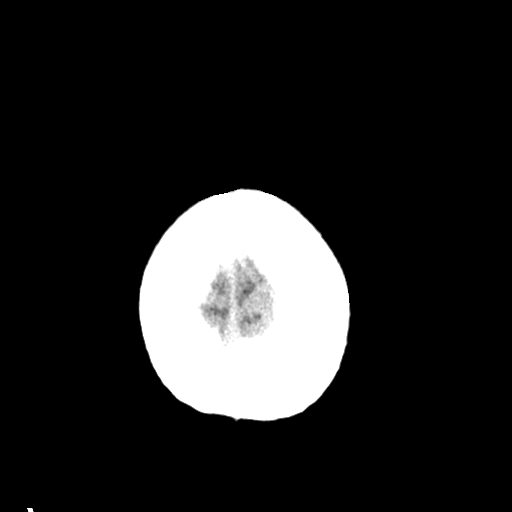

[Series 4: coronal soft · coronal · 0.32mm/px · 3 of 67 slices shown]
[im 23/67  brain]
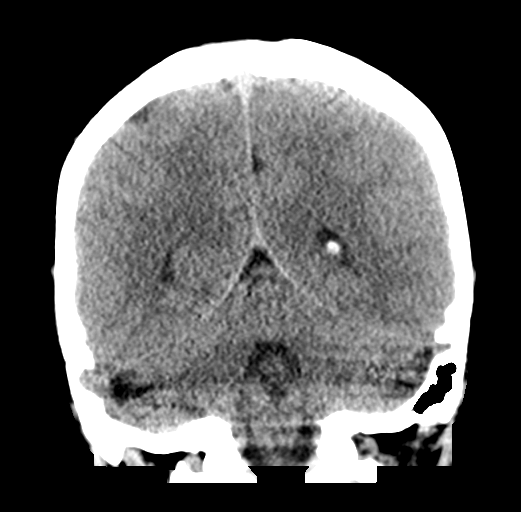
[im 30/67  brain]
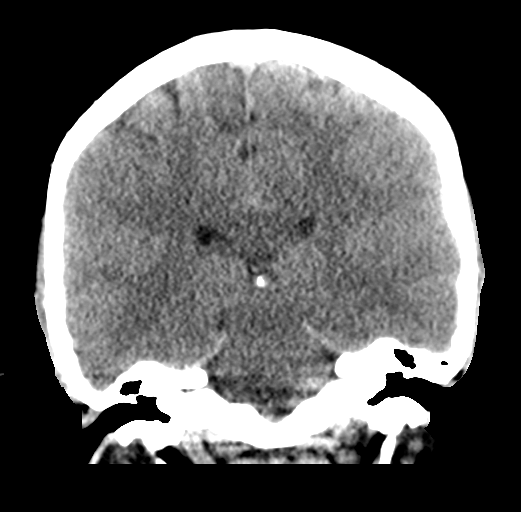
[im 37/67  brain]
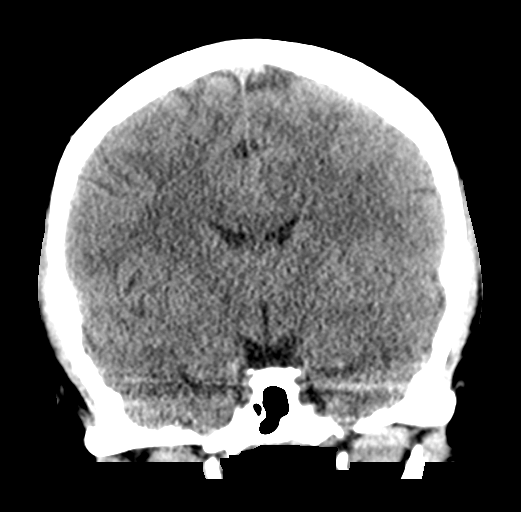

[Series 5: sagittal soft · sagittal · 0.32mm/px · 3 of 56 slices shown]
[im 19/56  brain]
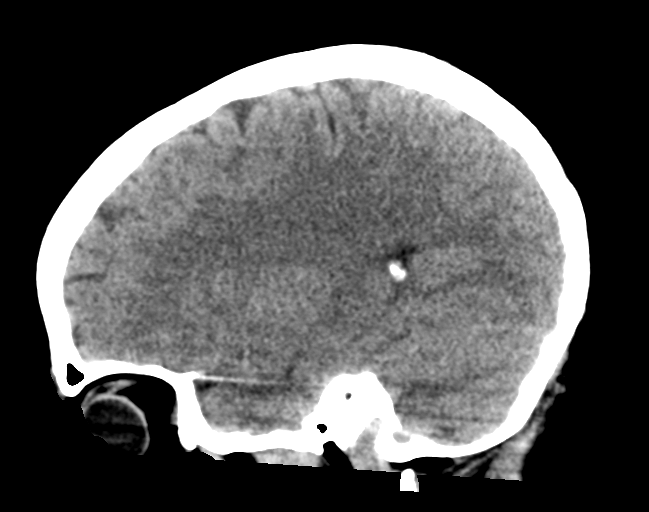
[im 28/56  brain]
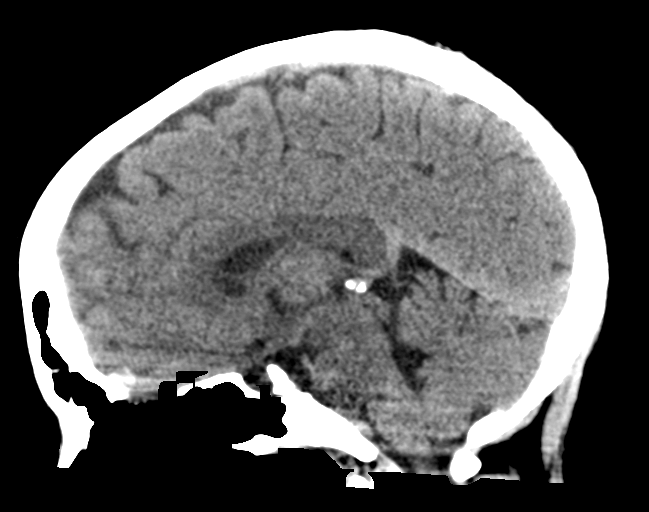
[im 37/56  brain]
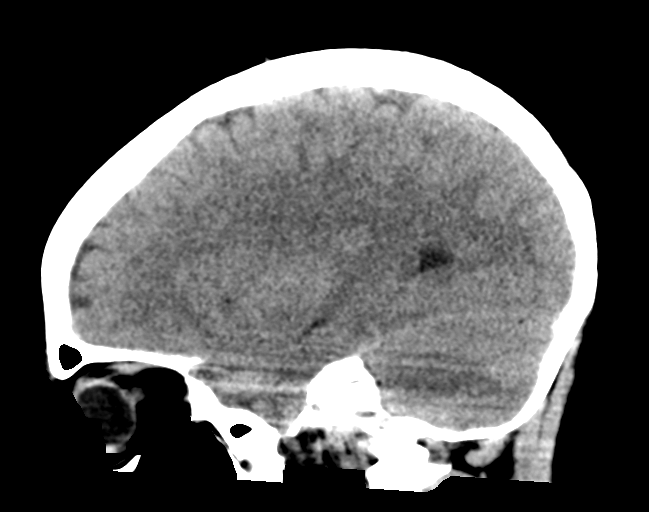

[16 of 47 positions shown; findings below may reference images not displayed]

FINDINGS: Brain: Normal ventricular morphology. No midline shift or mass
effect. Normal appearance of brain parenchyma. No intracranial
hemorrhage, mass lesion, evidence of acute infarction, or
extra-axial fluid collection.

Vascular: No hyperdense vessels

Skull: Intact

Sinuses/Orbits: Clear

Other: N/A
IMPRESSION: Normal exam.

## 2020-11-09 MED ORDER — SULFAMETHOXAZOLE-TRIMETHOPRIM 800-160 MG PO TABS: 1.0000 | ORAL_TABLET | Freq: Two times a day (BID) | ORAL | 0 refills | Status: DC

## 2020-11-09 NOTE — Progress Notes (Signed)
Acute Office Visit  Subjective:    Patient ID: Lisa Grimes, female    DOB: 1996/01/26, 24 y.o.   MRN: [id number]  No chief complaint on file.   HPI Pt says she has dysuria, polyuria, and complains of throbbing in the bladder area x few weeks.  Past Medical History:  Diagnosis Date   Abnormal white blood cell count    Acute bronchitis    Anemia    per patient    Anxiety    Chlamydia 2016   Depression with anxiety    Dye allergic reaction    Epistaxis    Fatigue    Gastroesophageal reflux disease    Hypoglycemia    IBS (irritable bowel syndrome)    per patient    Influenza A    Insomnia    Major depressive disorder, recurrent episode, moderate with anxious distress (HCC)    Menstrual headache    Migraine headache    Mild major depression (HCC)    Palpitations    Panic disorder with agoraphobia and severe panic attacks    Parent-child relational problem    Rhinitis, allergic    Screening for depression    Sinus tachycardia    Viral syndrome    Vitamin D deficiency    per patient     Past Surgical History:  Procedure Laterality Date   EYE SURGERY     MOUTH SURGERY  2010   STRABISMUS SURGERY  1997   TONSILLECTOMY  2003   tubes in ear  2000    Family History  Problem Relation Age of Onset   Depression Mother    Anxiety disorder Mother    Hypertension Mother    Alcohol abuse Father    Hypertension Father    Kidney disease Father    Bipolar disorder Maternal Aunt    Cancer Maternal Aunt        breast   Hypertension Maternal Aunt    Diabetes Maternal Aunt    Bipolar disorder Maternal Uncle    Hypertension Maternal Uncle    Bipolar disorder Paternal Uncle    Depression Maternal Grandmother    Stroke Maternal Grandmother    Hypertension Maternal Grandmother    Cancer Maternal Grandmother        breast   Heart disease Maternal Grandmother    Depression Paternal Grandmother    Bipolar disorder  Paternal Grandmother    Kidney disease Paternal Grandmother    Fibromyalgia Paternal Grandmother    ADD / ADHD Cousin    Depression Cousin    Heart attack Maternal Aunt    Heart disease Maternal Aunt    Hypertension Maternal Aunt    Heart attack Maternal Uncle    Heart disease Maternal Uncle    Diabetes Maternal Uncle    Stroke Maternal Grandfather    Hypertension Maternal Grandfather    Heart disease Maternal Grandfather    Healthy Brother    Healthy Brother    Healthy Daughter     Social History   Socioeconomic History   Marital status: Married    Spouse name: Not on file   Number of children: Not on file   Years of education: Not on file   Highest education level: Not on file  Occupational History   Not on file  Tobacco Use   Smoking status: Never Smoker   Smokeless tobacco: Never Used  Vaping Use   Vaping Use: Never used  Substance and Sexual Activity   Alcohol use: No  Drug use: No   Sexual activity: Yes    Birth control/protection: None  Other Topics Concern   Not on file  Social History Narrative   Not on file   Social Determinants of Health   Financial Resource Strain: Not on file  Food Insecurity: Not on file  Transportation Needs: Not on file  Physical Activity: Not on file  Stress: Not on file  Social Connections: Not on file  Intimate Partner Violence: Not on file    Outpatient Medications Prior to Visit  Medication Sig Dispense Refill   diltiazem (CARDIZEM CD) 120 MG 24 hr capsule Take 1 capsule (120 mg total) by mouth daily. 30 capsule 1   ibuprofen (ADVIL) 600 MG tablet Take 600 mg by mouth every 6 (six) hours as needed.     VITAMIN D PO Take 1,000 Units by mouth daily.     No facility-administered medications prior to visit.    Allergies  Allergen Reactions   Amoxicillin Hives    Has patient had a PCN reaction causing immediate rash, facial/tongue/throat swelling, SOB or lightheadedness with  hypotension: Yes Has patient had a PCN reaction causing severe rash involving mucus membranes or skin necrosis: No Has patient had a PCN reaction that required hospitalization No Has patient had a PCN reaction occurring within the last 10 years: No If all of the above answers are "NO", then may proceed with Cephalosporin use.    Latex    Penicillins Hives    Has patient had a PCN reaction causing immediate rash, facial/tongue/throat swelling, SOB or lightheadedness with hypotension: Yes Has patient had a PCN reaction causing severe rash involving mucus membranes or skin necrosis: No Has patient had a PCN reaction that required hospitalization No Has patient had a PCN reaction occurring within the last 10 years: no If all of the above answers are "NO", then may proceed with Cephalosporin use.    Red Dye     possible allergy    Review of Systems  HENT: Negative.   Respiratory: Negative.   Cardiovascular: Negative.   Gastrointestinal: Negative.   Genitourinary: Positive for dysuria.       Objective:    Physical Exam Constitutional:      General: She is not in acute distress.    Appearance: She is well-developed and well-nourished.  HENT:     Head: Normocephalic and atraumatic.     Right Ear: Hearing normal.     Left Ear: Hearing normal.     Nose: Nose normal.  Eyes:     General: Lids are normal. No scleral icterus.       Right eye: No discharge.        Left eye: No discharge.     Conjunctiva/sclera: Conjunctivae normal.  Cardiovascular:     Rate and Rhythm: Normal rate and regular rhythm.     Heart sounds: Normal heart sounds.  Pulmonary:     Effort: Pulmonary effort is normal. No respiratory distress.  Abdominal:     General: Bowel sounds are normal.     Palpations: Abdomen is soft.     Tenderness: There is abdominal tenderness. There is no right CVA tenderness or left CVA tenderness.     Comments: Slight suprapubic tenderness.  Musculoskeletal:        General:  Normal range of motion.  Skin:    General: Skin is intact.     Findings: No lesion or rash.  Neurological:     Mental Status: She is alert and oriented  to person, place, and time.  Psychiatric:        Mood and Affect: Mood and affect normal.        Speech: Speech normal.        Behavior: Behavior normal.        Thought Content: Thought content normal.     There were no vitals taken for this visit. Wt Readings from Last 3 Encounters:  09/12/20 201 lb (91.2 kg)  08/11/20 200 lb (90.7 kg)  07/23/20 206 lb (93.4 kg)    Health Maintenance Due  Topic Date Due   Hepatitis C Screening  Never done   COVID-19 Vaccine (1) Never done   PAP-Cervical Cytology Screening  12/13/2019   PAP SMEAR-Modifier  12/13/2019   INFLUENZA VACCINE  06/11/2020    There are no preventive care reminders to display for this patient.   Lab Results  Component Value Date   TSH 1.820 09/09/2019   Lab Results  Component Value Date   WBC 6.3 09/09/2019   HGB 14.0 09/09/2019   HCT 42.2 09/09/2019   MCV 80 09/09/2019   PLT 231 09/09/2019   Lab Results  Component Value Date   NA 137 09/09/2019   K 4.1 09/09/2019   CO2 20 09/09/2019   GLUCOSE 104 (H) 09/09/2019   BUN 10 09/09/2019   CREATININE 0.68 09/09/2019   BILITOT 0.4 09/09/2019   ALKPHOS 94 09/09/2019   AST 17 09/09/2019   ALT 11 09/09/2019   PROT 7.0 09/09/2019   ALBUMIN 4.3 09/09/2019   CALCIUM 9.3 09/09/2019   ANIONGAP 13 01/03/2018   No results found for: CHOL No results found for: HDL No results found for: LDLCALC No results found for: TRIG No results found for: CHOLHDL No results found for: ZESP2Z     Assessment & Plan:   1. Urinary tract infection without hematuria, site unspecified Bladder discomfort without fever, hematuria or posterior CVA tenderness to percussion. Symptoms started over the past couple weeks. Urinalysis showed several epithelial cells with occasional WBC and bacteria. Will get C&S and start  Bactrim-DS. May use AZO-Standard and increase water intake to flush out urinary tract. - POCT urinalysis dipstick - Urine Culture - sulfamethoxazole-trimethoprim (BACTRIM DS) 800-160 MG tablet; Take 1 tablet by mouth 2 (two) times daily.  Dispense: 14 tablet; Refill: 0  2. Pelvic pain in female Slight suprapubic discomfort and irregular menses. Pregnancy test negative. No vaginal discharge. - POCT urine pregnancy      No orders of the defined types were placed in this encounter.  I, Camillia Marcy, PA-C, have reviewed all documentation for this visit. The documentation on 11/09/20 for the exam, diagnosis, procedures, and orders are all accurate and complete.   Paticia Stack, CMA

## 2020-11-09 NOTE — Telephone Encounter (Signed)
Schedule for work in at [employer].

## 2020-11-09 NOTE — Telephone Encounter (Signed)
Can you add this patient to [facility] schedule and call patient? Thanks

## 2020-11-09 NOTE — Telephone Encounter (Signed)
Copied from CRM [id number]. Topic: Appointment Scheduling - Scheduling Inquiry for Clinic >> Nov 09, 2020 10:30 AM Wyonia Hough E wrote: Reason for CRM: Pt wanted to bee seen today for a possible UTI/ there was a spot at 2pm on [place] schedule but it wouldn't let me add appt /Pt has painful urination and pain in bladder area/ it has been off and on for 2 weeks /with foul smell to urine / Please advise if pt can be seen today

## 2020-11-14 ENCOUNTER — Ambulatory Visit: Payer: 59 | Admitting: Physician Assistant

## 2020-11-15 LAB — URINE CULTURE

## 2020-11-15 NOTE — Progress Notes (Deleted)
Office Visit Note  Patient: Lisa Grimes             Date of Birth: 1996/07/05           MRN: [id number]             PCP: Jerrol Banana., MD Referring: Jerrol Banana.,* Visit Date: 11/17/2020 Occupation: _0 @  Subjective:  No chief complaint on file.   History of Present Illness: Lisa Grimes is a 25 y.o. female ***   Activities of Daily Living:  Patient reports morning stiffness for *** {minute/hour:19697}.   Patient {ACTIONS;DENIES/REPORTS:21021675::"Denies"} nocturnal pain.  Difficulty dressing/grooming: {ACTIONS;DENIES/REPORTS:21021675::"Denies"} Difficulty climbing stairs: {ACTIONS;DENIES/REPORTS:21021675::"Denies"} Difficulty getting out of chair: {ACTIONS;DENIES/REPORTS:21021675::"Denies"} Difficulty using hands for taps, buttons, cutlery, and/or writing: {ACTIONS;DENIES/REPORTS:21021675::"Denies"}  No Rheumatology ROS completed.   PMFS History:  Patient Active Problem List   Diagnosis Date Noted  . Preeclampsia, severe, third trimester 06/13/2017  . Gestational hypertension 06/05/2017  . Supervision of high risk pregnancy, antepartum 04/22/2017  . Depression with anxiety 04/22/2017  . UTI (urinary tract infection) during pregnancy, first trimester 04/22/2017  . Anxiety 02/20/2015  . Fatigue 02/20/2015  . Acid reflux 02/20/2015  . Headache, menstrual migraine 02/20/2015  . Depression, major, recurrent, moderate ([place]) 02/20/2015  . Adaptive colitis 02/20/2015  . Headache, migraine 02/20/2015  . Panic disorder with agoraphobia and severe panic attacks 02/20/2015    Past Medical History:  Diagnosis Date  . Abnormal white blood cell count   . Acute bronchitis   . Anemia    per patient   . Anxiety   . Chlamydia 2016  . Depression with anxiety   . Dye allergic reaction   . Epistaxis   . Fatigue   . Gastroesophageal reflux disease   . Hypoglycemia   . IBS (irritable bowel syndrome)    per patient   . Influenza A   . Insomnia   .  Major depressive disorder, recurrent episode, moderate with anxious distress ([place])   . Menstrual headache   . Migraine headache   . Mild major depression ([place])   . Palpitations   . Panic disorder with agoraphobia and severe panic attacks   . Parent-child relational problem   . Rhinitis, allergic   . Screening for depression   . Sinus tachycardia   . Viral syndrome   . Vitamin D deficiency    per patient     Family History  Problem Relation Age of Onset  . Depression Mother   . Anxiety disorder Mother   . Hypertension Mother   . Alcohol abuse Father   . Hypertension Father   . Kidney disease Father   . Bipolar disorder Maternal Aunt   . Cancer Maternal Aunt        breast  . Hypertension Maternal Aunt   . Diabetes Maternal Aunt   . Bipolar disorder Maternal Uncle   . Hypertension Maternal Uncle   . Bipolar disorder Paternal Uncle   . Depression Maternal Grandmother   . Stroke Maternal Grandmother   . Hypertension Maternal Grandmother   . Cancer Maternal Grandmother        breast  . Heart disease Maternal Grandmother   . Depression Paternal Grandmother   . Bipolar disorder Paternal Grandmother   . Kidney disease Paternal Grandmother   . Fibromyalgia Paternal Grandmother   . ADD / ADHD Cousin   . Depression Cousin   . Heart attack Maternal Aunt   . Heart disease Maternal Aunt   . Hypertension  Maternal Aunt   . Heart attack Maternal Uncle   . Heart disease Maternal Uncle   . Diabetes Maternal Uncle   . Stroke Maternal Grandfather   . Hypertension Maternal Grandfather   . Heart disease Maternal Grandfather   . Healthy Brother   . Healthy Brother   . Healthy Daughter    Past Surgical History:  Procedure Laterality Date  . EYE SURGERY    . MOUTH SURGERY  2010  . STRABISMUS SURGERY  1997  . TONSILLECTOMY  2003  . tubes in ear  2000   Social History   Social History Narrative  . Not on file   Immunization History  Administered Date(s) Administered  . DTaP  06/20/1995, 08/20/1995, 10/20/1995, 07/20/1996, 03/24/2000  . Hepatitis A 10/09/2006, 07/16/2007  . Hepatitis B 1996-08-17, 06/20/1995, 10/20/1995  . HiB (PRP-OMP) 06/20/1995, 08/20/1995, 10/20/1995, 07/20/1996  . IPV 06/20/1995, 08/20/1995, 10/20/1995, 03/24/2000  . Influenza-Unspecified 10/09/2006, 08/16/2013  . MMR 07/20/1996, 03/24/2000  . Meningococcal Conjugate 10/09/2006, 08/16/2013  . Tdap 07/16/2007, 06/16/2017  . Varicella 07/20/1996, 10/09/2006     Objective: Vital Signs: LMP 10/24/2020    Physical Exam   Musculoskeletal Exam: ***  CDAI Exam: CDAI Score: -- Patient Global: --; Provider Global: -- Swollen: --; Tender: -- Joint Exam 11/17/2020   No joint exam has been documented for this visit   There is currently no information documented on the homunculus. Go to the Rheumatology activity and complete the homunculus joint exam.  Investigation: No additional findings.  Imaging: No results found.  Recent Labs: Lab Results  Component Value Date   WBC 6.3 09/09/2019   HGB 14.0 09/09/2019   PLT 231 09/09/2019   NA 137 09/09/2019   K 4.1 09/09/2019   CL 102 09/09/2019   CO2 20 09/09/2019   GLUCOSE 104 (H) 09/09/2019   BUN 10 09/09/2019   CREATININE 0.68 09/09/2019   BILITOT 0.4 09/09/2019   ALKPHOS 94 09/09/2019   AST 17 09/09/2019   ALT 11 09/09/2019   PROT 7.0 09/09/2019   ALBUMIN 4.3 09/09/2019   CALCIUM 9.3 09/09/2019   GFRAA 142 09/09/2019    Speciality Comments: No specialty comments available.  Procedures:  No procedures performed Allergies: Amoxicillin, Latex, Penicillins, and Red dye   Assessment / Plan:     Visit Diagnoses: No diagnosis found.  Orders: No orders of the defined types were placed in this encounter.  No orders of the defined types were placed in this encounter.   Face-to-face time spent with patient was *** minutes. Greater than 50% of time was spent in counseling and coordination of care.  Follow-Up Instructions:  No follow-ups on file.   Earnestine Mealing, CMA  Note - This record has been created using Editor, commissioning.  Chart creation errors have been sought, but may not always  have been located. Such creation errors do not reflect on  the standard of medical care.

## 2020-11-17 ENCOUNTER — Ambulatory Visit: Payer: 59 | Admitting: Physician Assistant

## 2020-11-17 DIAGNOSIS — Z8719 Personal history of other diseases of the digestive system: Secondary | ICD-10-CM

## 2020-11-17 DIAGNOSIS — I7300 Raynaud's syndrome without gangrene: Secondary | ICD-10-CM

## 2020-11-17 DIAGNOSIS — M7701 Medial epicondylitis, right elbow: Secondary | ICD-10-CM

## 2020-11-17 DIAGNOSIS — F418 Other specified anxiety disorders: Secondary | ICD-10-CM

## 2020-11-17 DIAGNOSIS — Z8679 Personal history of other diseases of the circulatory system: Secondary | ICD-10-CM

## 2020-11-17 DIAGNOSIS — R768 Other specified abnormal immunological findings in serum: Secondary | ICD-10-CM

## 2020-11-17 DIAGNOSIS — K589 Irritable bowel syndrome without diarrhea: Secondary | ICD-10-CM

## 2020-11-17 DIAGNOSIS — G8929 Other chronic pain: Secondary | ICD-10-CM

## 2020-11-17 DIAGNOSIS — O1413 Severe pre-eclampsia, third trimester: Secondary | ICD-10-CM

## 2020-11-17 DIAGNOSIS — Z8669 Personal history of other diseases of the nervous system and sense organs: Secondary | ICD-10-CM

## 2020-11-17 NOTE — Progress Notes (Signed)
Office Visit Note  Patient: Lisa Grimes             Date of Birth: 20-Feb-1996           MRN: [id number]             PCP: Jerrol Banana., MD Referring: Jerrol Banana.,* Visit Date: 11/20/2020 Occupation: _0 @  Subjective:  Discuss [facility] lab work   History of Present Illness: Lisa Grimes is a 25 y.o. female with history of positive ANA and Raynaud's syndrome.  Patient presents today to discuss [facility] lab work from 10/10/2020.  She also brought several photographs with her today of recent rashes she has noticed.  She has had an increased frequency of facial rashes as well as rashes on her neck and chest.  She has ongoing photosensitivity.  She continues to have recurrent oral ulcerations and has noticed some cervical lymphadenopathy which has been unchanging.  She has noticed some increased hair thinning recently.  She has also had worsening symptoms of Raynaud's.  Patient reports that she was initially switched from metoprolol to diltiazem to see if she would have any improvement in her symptoms of Raynaud's.  Her symptoms of raynaud's have been occurring on a daily basis.  According to the patient her symptoms of Raynaud's have actually been progressing and she has noticed some ulcerations intermittently.  She reports that she plans on following up with her cardiologist to discuss restarting on Metroprolol for management of sinus tachycardia.  She reports that she tried taking amlodipine in the past but developed vomiting and dizziness and discontinued.  She has easy bruising so she has not tried taking aspirin.  She presents today with increased pain and intermittent swelling in her right hand.  She states that since her last office visit she has had 3 episodes of swelling over her right second and third MCP joints.  She states that her mother was recently diagnosed with rheumatoid arthritis.  She states she is also been experiencing increased pain in her pelvis especially  in both hip joints.  She describes the pain as a deep ache in her groin.  She denies any discomfort due to trochanteric bursitis.  She also has ongoing tenderness hypermobility of both elbows.  She has tried taking Tylenol and Advil as needed for pain relief.  She has been experiencing increased nocturnal pain which has been worsening her fatigue.    Activities of Daily Living:  Patient reports morning stiffness for 1 hour.   Patient Reports nocturnal pain.  Difficulty dressing/grooming: Reports Difficulty climbing stairs: Reports Difficulty getting out of chair: Denies Difficulty using hands for taps, buttons, cutlery, and/or writing: Reports  Review of Systems  Constitutional: Positive for fatigue.  HENT: Negative for mouth sores, mouth dryness and nose dryness.   Eyes: Positive for dryness. Negative for pain and visual disturbance.  Respiratory: Positive for shortness of breath. Negative for cough, hemoptysis and difficulty breathing.   Cardiovascular: Negative for chest pain, palpitations, hypertension and swelling in legs/feet.  Gastrointestinal: Positive for constipation. Negative for blood in stool and diarrhea.  Endocrine: Positive for cold intolerance.  Genitourinary: Positive for difficulty urinating and painful urination.  Musculoskeletal: Positive for arthralgias, joint pain, joint swelling, muscle weakness, morning stiffness and muscle tenderness. Negative for myalgias and myalgias.  Skin: Positive for color change, rash and sensitivity to sunlight. Negative for pallor, hair loss, nodules/bumps, skin tightness and ulcers.  Neurological: Positive for numbness and weakness. Negative for dizziness and  headaches.  Hematological: Positive for bruising/bleeding tendency. Negative for swollen glands.  Psychiatric/Behavioral: Positive for sleep disturbance. Negative for depressed mood. The patient is not nervous/anxious.     PMFS History:  Patient Active Problem List   Diagnosis  Date Noted  . Preeclampsia, severe, third trimester 06/13/2017  . Gestational hypertension 06/05/2017  . Supervision of high risk pregnancy, antepartum 04/22/2017  . Depression with anxiety 04/22/2017  . UTI (urinary tract infection) during pregnancy, first trimester 04/22/2017  . Anxiety 02/20/2015  . Fatigue 02/20/2015  . Acid reflux 02/20/2015  . Headache, menstrual migraine 02/20/2015  . Depression, major, recurrent, moderate ([place]) 02/20/2015  . Adaptive colitis 02/20/2015  . Headache, migraine 02/20/2015  . Panic disorder with agoraphobia and severe panic attacks 02/20/2015    Past Medical History:  Diagnosis Date  . Abnormal white blood cell count   . Acute bronchitis   . Anemia    per patient   . Anxiety   . Chlamydia 2016  . Depression with anxiety   . Dye allergic reaction   . Epistaxis   . Fatigue   . Gastroesophageal reflux disease   . Hypoglycemia   . IBS (irritable bowel syndrome)    per patient   . Influenza A   . Insomnia   . Major depressive disorder, recurrent episode, moderate with anxious distress ([place])   . Menstrual headache   . Migraine headache   . Mild major depression ([place])   . Palpitations   . Panic disorder with agoraphobia and severe panic attacks   . Parent-child relational problem   . Rhinitis, allergic   . Screening for depression   . Sinus tachycardia   . Viral syndrome   . Vitamin D deficiency    per patient     Family History  Problem Relation Age of Onset  . Depression Mother   . Anxiety disorder Mother   . Hypertension Mother   . Alcohol abuse Father   . Hypertension Father   . Kidney disease Father   . Bipolar disorder Maternal Aunt   . Cancer Maternal Aunt        breast  . Hypertension Maternal Aunt   . Diabetes Maternal Aunt   . Bipolar disorder Maternal Uncle   . Hypertension Maternal Uncle   . Bipolar disorder Paternal Uncle   . Depression Maternal Grandmother   . Stroke Maternal Grandmother   . Hypertension  Maternal Grandmother   . Cancer Maternal Grandmother        breast  . Heart disease Maternal Grandmother   . Depression Paternal Grandmother   . Bipolar disorder Paternal Grandmother   . Kidney disease Paternal Grandmother   . Fibromyalgia Paternal Grandmother   . ADD / ADHD Cousin   . Depression Cousin   . Heart attack Maternal Aunt   . Heart disease Maternal Aunt   . Hypertension Maternal Aunt   . Heart attack Maternal Uncle   . Heart disease Maternal Uncle   . Diabetes Maternal Uncle   . Stroke Maternal Grandfather   . Hypertension Maternal Grandfather   . Heart disease Maternal Grandfather   . Healthy Brother   . Healthy Brother   . Healthy Daughter    Past Surgical History:  Procedure Laterality Date  . EYE SURGERY    . MOUTH SURGERY  2010  . STRABISMUS SURGERY  1997  . TONSILLECTOMY  2003  . tubes in ear  2000   Social History   Social History Narrative  . Not  on file   Immunization History  Administered Date(s) Administered  . DTaP 06/20/1995, 08/20/1995, 10/20/1995, 07/20/1996, 03/24/2000  . Hepatitis A 10/09/2006, 07/16/2007  . Hepatitis B 1996-07-21, 06/20/1995, 10/20/1995  . HiB (PRP-OMP) 06/20/1995, 08/20/1995, 10/20/1995, 07/20/1996  . IPV 06/20/1995, 08/20/1995, 10/20/1995, 03/24/2000  . Influenza-Unspecified 10/09/2006, 08/16/2013  . MMR 07/20/1996, 03/24/2000  . Meningococcal Conjugate 10/09/2006, 08/16/2013  . Tdap 07/16/2007, 06/16/2017  . Varicella 07/20/1996, 10/09/2006     Objective: Vital Signs: BP 138/86 (BP Location: Left Arm, Patient Position: Sitting, Cuff Size: Normal)   Pulse (!) 114   Resp 14   Ht _0  (1.6 m)   Wt 198 lb (89.8 kg)   LMP 10/24/2020   BMI 35.07 kg/m    Physical Exam Vitals and nursing note reviewed.  Constitutional:      Appearance: She is well-developed and well-nourished.  HENT:     Head: Normocephalic and atraumatic.  Eyes:     Extraocular Movements: EOM normal.     Conjunctiva/sclera: Conjunctivae  normal.  Cardiovascular:     Pulses: Intact distal pulses.  Pulmonary:     Effort: Pulmonary effort is normal.  Abdominal:     Palpations: Abdomen is soft.  Musculoskeletal:     Cervical back: Normal range of motion.  Skin:    General: Skin is warm and dry.     Capillary Refill: Capillary refill takes less than 2 seconds.     Findings: Erythema (Facial) present.     Comments: No digital ulcerations or signs of gangrene.  No skin tightness or thickening.   Neurological:     Mental Status: She is alert and oriented to person, place, and time.  Psychiatric:        Mood and Affect: Mood and affect normal.        Behavior: Behavior normal.      Musculoskeletal Exam: C-spine, thoracic spine, and lumbar spinal good range of motion with no discomfort.  Trapezius muscle tension and muscle tenderness bilaterally.  Shoulder joints, elbow joints, wrist joints, MCPs, PIPs, DIPs have good range of motion with no synovitis.  Tenderness of the right second and third MCP joints noted.  Complete fist formation bilaterally.  Painful range of motion of both hips.  Mild tenderness over bilateral trochanteric bursa.  Knee joints have good range of motion with no warmth or effusion.  Ankle joints have good range of motion with no tenderness or inflammation.  CDAI Exam: CDAI Score: -- Patient Global: --; Provider Global: -- Swollen: --; Tender: -- Joint Exam 11/20/2020   No joint exam has been documented for this visit   There is currently no information documented on the homunculus. Go to the Rheumatology activity and complete the homunculus joint exam.  Investigation: No additional findings.  Imaging: No results found.  Recent Labs: Lab Results  Component Value Date   WBC 6.3 09/09/2019   HGB 14.0 09/09/2019   PLT 231 09/09/2019   NA 137 09/09/2019   K 4.1 09/09/2019   CL 102 09/09/2019   CO2 20 09/09/2019   GLUCOSE 104 (H) 09/09/2019   BUN 10 09/09/2019   CREATININE 0.68 09/09/2019    BILITOT 0.4 09/09/2019   ALKPHOS 94 09/09/2019   AST 17 09/09/2019   ALT 11 09/09/2019   PROT 7.0 09/09/2019   ALBUMIN 4.3 09/09/2019   CALCIUM 9.3 09/09/2019   GFRAA 142 09/09/2019    Speciality Comments: No specialty comments available.  Procedures:  No procedures performed Allergies: Amoxicillin, Latex, Penicillins, and Red dye  Assessment / Plan:     Visit Diagnoses: Raynaud's disease without gangrene - History of Raynaud's for many years.  She has low titer positive ANA and weak positive histone antibody. SCL 70 negative, anti-RNP polymerase 3 negative, and anticentromere protein B was negative.  She has been experiencing more frequent and severe symptoms of Raynaud's over the past several months.  She brought pictures with her which appeared to reveal blister-like lesions on her hands.  The photographs were not consistent with typical digital ulcerations or signs of cyanosis.  On examination today, no digital ulcerations or signs of gangrene were noted.  She had good capillary refill and her hands were warm to touch.   She has no skin thickening or tightness on exam concerning for sclerodactyly.  We discussed conservative treatment measures to manage symptoms of Raynaud's.  She has not noticed any improvement with the use of gloves.  In the past she could not tolerate taking amlodipine due to experiencing vomiting and dizziness.  She was switched from metoprolol to diltiazem for management of sinus tachycardia but has not noticed any improvement in her symptoms of Raynaud's since switching to a CCB.  Her symptoms have sinus tachycardia have also been worsening so she plans on following up with her cardiologist to discuss restarting on metoprolol. She was advised to notify us if she develops new or worsening symptoms.   Positive ANA (antinuclear antibody) - [facility] lab work from 04/24/20 and 10/10/20 were reviewed today in the office. ANA remains positive (1:320H) and anti-histone is a weak  positive. Lupus index remains negative. Her medication list was reviewed today in detail.  Calcium channel blockers (including diltiazem) have shown to induce SCLE and beta blockers have a low probability of drug induced lupus, which was discussed.  She plans on further discussing this with her cardiologist.  Since her last office visit she has had more frequent and severe symptoms of Raynaud's, facial rashes, ongoing photosensitivity, oral ulcerations, swollen cervical lymphadenopathy, and increased hair thinning.  She is also been having increased pain in multiple joints including both hands and both hips.  She has noticed intermittent swelling in the right second and third MCP joints but no synovitis was noted.  According to the patient her mother was also recently diagnosed with rheumatoid arthritis.  We discussed the lab work in detail today and all questions were addressed.  X-rays of both hands and both hips were updated today.  She will be scheduled for an ultrasound of both hands to assess for synovitis. She was also given a handout of information about Plaquenil to review in case she will require Plaquenil use in the future.  Facial rash: She has been experiencing recurrent facial rashes over the past several months.  She brought several photographs and had facial erythema on exam today.  It is unclear if the rash is due to a typical malar rash secondary to SLE versus rosacea.  Pain in both hands -She has been experiencing increased pain and intermittent inflammation in both hands, especially the right hand.  She has tenderness palpation over the right second and third MCP joints on examination today.  She brought a picture with her of diffuse swelling over the knuckles of her right hand.  No synovitis was palpable on examination today.  Of note her mother was recently diagnosed with rheumatoid arthritis.  X-rays of both hands were updated today.  She will be scheduled for an ultrasound of both hands  to assess for synovitis.  Plan:  XR Hand 2 View Right, XR Hand 2 View Left  Family history of rheumatoid arthritis: According to the patient her mother was recently diagnosed with rheumatoid arthritis.  The patient has been experiencing increased pain and intermittent swelling in her right hand over the past several months.  X-rays of both hands were obtained today.  She will be scheduled for an ultrasound of both hands to assess for synovitis.  Medial epicondylitis of both elbows: She has tenderness palpation over the medial epicondyle of both elbows.  We discussed the use of a brace as well as Voltaren gel which she can apply topically as needed for pain relief.  Chronic pain of both hips -She has been experiencing increased pain in both hips.  The pain has been radiating down in her groin.  She has painful limited range of motion of both hips on examination today.  She has mild tenderness palpation over bilateral trochanteric bursa.  X-rays of both hips were obtained today for further evaluation.   Hx of sinus tachycardia: She follows up with her cardiologist on a regular basis.  She was initially treated with metoprolol which managed her tachycardia.  She was switched from metoprolol to diltiazem to see if her symptoms of Raynaud's would improve on a calcium channel blocker rather than a beta-blocker.  According to the patient, her symptoms of Raynaud's have become more frequent and severe. She plans on following up with her cardiologist to discuss restarting on metoprolol which managed her sinus tachycardia more effectively and did not seem to worsen symptoms of  Raynaud's.   Discussed that we often use amlodipine for management of symptoms, but she should not tolerate amlodipine in the past (d/c due to vomiting and dizziness). She will discuss the use of aspirin with her cardiologist to try to improve circulation.  Nitroglycerin ointment may be an option in the future as well.  [facility] lab work on  04/24/20 and 10/10/20 revealed positive ANA and weak positive histone antibodies.  Her medication list was reviewed today in the office.  Discussed that diltiazem (and other CCBs) have been shown to induce of subacute cutaneous lupus. She will further discuss with her cardiologist.   Other medical conditions are listed as follows:  Preeclampsia, severe, third trimester - 2018 with premature delivery at 34 weeks.  She is gravida 1, para 1 miscarriages 0.  History of gastroesophageal reflux (GERD)  Hx of migraines  Adaptive colitis  Depression with anxiety   Orders: Orders Placed This Encounter  Procedures  . XR Hand 2 View Right  . XR Hand 2 View Left  . XR HIPS BILAT W OR W/O PELVIS 3-4 VIEWS   No orders of the defined types were placed in this encounter.     Follow-Up Instructions: Return in about 2 months (around 01/18/2021).   Ofilia Neas, PA-C  Note - This record has been created using Dragon software.  Chart creation errors have been sought, but may not always  have been located. Such creation errors do not reflect on  the standard of medical care.

## 2020-11-20 ENCOUNTER — Ambulatory Visit (INDEPENDENT_AMBULATORY_CARE_PROVIDER_SITE_OTHER): Payer: 59 | Admitting: Physician Assistant

## 2020-11-20 ENCOUNTER — Encounter: Payer: Self-pay | Admitting: Physician Assistant

## 2020-11-20 ENCOUNTER — Ambulatory Visit: Payer: Self-pay

## 2020-11-20 ENCOUNTER — Other Ambulatory Visit: Payer: Self-pay

## 2020-11-20 ENCOUNTER — Telehealth: Payer: Self-pay | Admitting: Cardiology

## 2020-11-20 VITALS — BP 138/86 | HR 114 | Resp 14 | Ht 63.0 in | Wt 198.0 lb

## 2020-11-20 DIAGNOSIS — O1413 Severe pre-eclampsia, third trimester: Secondary | ICD-10-CM

## 2020-11-20 DIAGNOSIS — Z8261 Family history of arthritis: Secondary | ICD-10-CM

## 2020-11-20 DIAGNOSIS — R768 Other specified abnormal immunological findings in serum: Secondary | ICD-10-CM | POA: Diagnosis not present

## 2020-11-20 DIAGNOSIS — Z8719 Personal history of other diseases of the digestive system: Secondary | ICD-10-CM

## 2020-11-20 DIAGNOSIS — M25551 Pain in right hip: Secondary | ICD-10-CM | POA: Diagnosis not present

## 2020-11-20 DIAGNOSIS — G8929 Other chronic pain: Secondary | ICD-10-CM

## 2020-11-20 DIAGNOSIS — F418 Other specified anxiety disorders: Secondary | ICD-10-CM

## 2020-11-20 DIAGNOSIS — I7300 Raynaud's syndrome without gangrene: Secondary | ICD-10-CM

## 2020-11-20 DIAGNOSIS — M25552 Pain in left hip: Secondary | ICD-10-CM

## 2020-11-20 DIAGNOSIS — M7702 Medial epicondylitis, left elbow: Secondary | ICD-10-CM

## 2020-11-20 DIAGNOSIS — K589 Irritable bowel syndrome without diarrhea: Secondary | ICD-10-CM

## 2020-11-20 DIAGNOSIS — M79642 Pain in left hand: Secondary | ICD-10-CM

## 2020-11-20 DIAGNOSIS — M79641 Pain in right hand: Secondary | ICD-10-CM

## 2020-11-20 DIAGNOSIS — R21 Rash and other nonspecific skin eruption: Secondary | ICD-10-CM

## 2020-11-20 DIAGNOSIS — M7701 Medial epicondylitis, right elbow: Secondary | ICD-10-CM

## 2020-11-20 DIAGNOSIS — Z8669 Personal history of other diseases of the nervous system and sense organs: Secondary | ICD-10-CM

## 2020-11-20 DIAGNOSIS — Z8679 Personal history of other diseases of the circulatory system: Secondary | ICD-10-CM

## 2020-11-20 NOTE — Patient Instructions (Signed)
Hydroxychloroquine tablets What is this medicine? HYDROXYCHLOROQUINE (hye drox ee KLOR oh [place]) is used to treat rheumatoid arthritis and systemic lupus erythematosus. It is also used to treat malaria. This medicine may be used for other purposes; ask your health care provider or pharmacist if you have questions. COMMON BRAND NAME(S): Plaquenil, Quineprox What should I tell my health care provider before I take this medicine? They need to know if you have any of these conditions:  diabetes  eye disease, vision problems  G6PD deficiency  heart disease  history of irregular heartbeat  if you often drink alcohol  kidney disease  liver disease  porphyria  psoriasis  an unusual or allergic reaction to chloroquine, hydroxychloroquine, other medicines, foods, dyes, or preservatives  pregnant or trying to get pregnant  breast-feeding How should I use this medicine? Take this medicine by mouth with a glass of water. Take it as directed on the prescription label. Do not cut, crush or chew this medicine. Swallow the tablets whole. Take it with food. Do not take it more than directed. Take all of this medicine unless your health care provider tells you to stop it early. Keep taking it even if you think you are better. Take products with antacids in them at a different time of day than this medicine. Take this medicine 4 hours before or 4 hours after antacids. Talk to your health care provider if you have questions. Talk to your pediatrician regarding the use of this medicine in children. While this drug may be prescribed for selected conditions, precautions do apply. Overdosage: If you think you have taken too much of this medicine contact a poison control center or emergency room at once. NOTE: This medicine is only for you. Do not share this medicine with others. What if I miss a dose? If you miss a dose, take it as soon as you can. If it is almost time for your next dose, take only  that dose. Do not take double or extra doses. What may interact with this medicine? Do not take this medicine with any of the following medications:  cisapride  dronedarone  pimozide  thioridazine This medicine may also interact with the following medications:  ampicillin  antacids  cimetidine  cyclosporine  digoxin  kaolin  medicines for diabetes, like insulin, glipizide, glyburide  medicines for seizures like carbamazepine, phenobarbital, phenytoin  mefloquine  methotrexate  other medicines that prolong the QT interval (cause an abnormal heart rhythm)  praziquantel This list may not describe all possible interactions. Give your health care provider a list of all the medicines, herbs, non-prescription drugs, or dietary supplements you use. Also tell them if you smoke, drink alcohol, or use illegal drugs. Some items may interact with your medicine. What should I watch for while using this medicine? Visit your health care provider for regular checks on your progress. Tell your health care provider if your symptoms do not start to get better or if they get worse. You may need blood work done while you are taking this medicine. If you take other medicines that can affect heart rhythm, you may need more testing. Talk to your health care provider if you have questions. Your vision may be tested before and during use of this medicine. Tell your health care provider right away if you have any change in your eyesight. This medicine may cause serious skin reactions. They can happen weeks to months after starting the medicine. Contact your health care provider right away if you   notice fevers or flu-like symptoms with a rash. The rash may be red or purple and then turn into blisters or peeling of the skin. Or, you might notice a red rash with swelling of the face, lips or lymph nodes in your neck or under your arms. If you or your family notice any changes in your behavior, such as new  or worsening depression, thoughts of harming yourself, anxiety, or other unusual or disturbing thoughts, or memory loss, call your health care provider right away. What side effects may I notice from receiving this medicine? Side effects that you should report to your doctor or health care professional as soon as possible:  allergic reactions (skin rash, itching or hives; swelling of the face, lips, or tongue)  changes in vision  decreased hearing, ringing in the ears  heartbeat rhythm changes (trouble breathing; chest pain; dizziness; fast, irregular heartbeat; feeling faint or lightheaded, falls)  liver injury (dark yellow or brown urine; general ill feeling or flu-like symptoms; loss of appetite, right upper belly pain; unusually weak or tired, yellowing of the eyes or skin)  low blood sugar (feeling anxious; confusion; dizziness; increased hunger; unusually weak or tired; increased sweating; shakiness; cold, clammy skin; irritable; headache; blurred vision; fast heartbeat; loss of consciousness)  low red blood cell counts (trouble breathing; feeling faint; lightheaded, falls; unusually weak or tired)  muscle weakness  pain, tingling, numbness in the hands or feet  rash, fever, and swollen lymph nodes  redness, blistering, peeling or loosening of the skin, including inside the mouth  suicidal thoughts, mood changes  uncontrollable head, mouth, neck, arm, or leg movements  unusual bruising or bleeding Side effects that usually do not require medical attention (report to your doctor or health care professional if they continue or are bothersome):  diarrhea  hair loss  irritable This list may not describe all possible side effects. Call your doctor for medical advice about side effects. You may report side effects to FDA at 1-800-FDA-1088. Where should I keep my medicine? Keep out of the reach of children and pets. Store at room temperature up to 30 degrees C (86 degrees F).  Protect from light. Get rid of any unused medicine after the expiration date. To get rid of medicines that are no longer needed or have expired:  Take the medicine to a medicine take-back program. Check with your pharmacy or law enforcement to find a location.  If you cannot return the medicine, check the label or package insert to see if the medicine should be thrown out in the garbage or flushed down the toilet. If you are not sure, ask your health care provider. If it is safe to put it in the trash, empty the medicine out of the container. Mix the medicine with cat litter, dirt, coffee grounds, or other unwanted substance. Seal the mixture in a bag or container. Put it in the trash. NOTE: This sheet is a summary. It may not cover all possible information. If you have questions about this medicine, talk to your doctor, pharmacist, or health care provider.  2021 [employer]vier/Gold Standard (2020-04-17 15:07:49)  

## 2020-11-20 NOTE — Telephone Encounter (Signed)
Pt c/o medication issue:  1. Name of Medication: Beta Blocker   2. How are you currently taking this medication (dosage and times per day)? Not currently taking   3. Are you having a reaction (difficulty breathing--STAT)? no  4. What is your medication issue? Patient calling to discuss stopping diltiazem and changing to a pregnancy friendly / safe beta blocker as she is planning upcoming conception.    Please call.

## 2020-11-21 NOTE — Progress Notes (Signed)
I called the patient to review x-ray results.  The patient did not have any further questions.  She is scheduled for an ultrasound of both hands on 12/06/20 with Dr. Corliss Skains to assess for synovitis.

## 2020-11-22 MED ORDER — METOPROLOL TARTRATE 50 MG PO TABS: 50.0000 mg | ORAL_TABLET | Freq: Two times a day (BID) | ORAL | 3 refills | Status: DC

## 2020-11-22 NOTE — Telephone Encounter (Signed)
Patient calling to check on status Patient only has 5 or 6 days left of medication

## 2020-11-22 NOTE — Telephone Encounter (Signed)
Spoke with Dr. Azucena Cecil and he recommended that the patient stop Diltiazem and start Lopressor 50 MG BID. I called patient and informed her of this. Prescription sent to [facility] as requested. Patient verbalized understanding and agreed with plan.

## 2020-12-06 ENCOUNTER — Ambulatory Visit: Payer: Self-pay

## 2020-12-06 ENCOUNTER — Ambulatory Visit (INDEPENDENT_AMBULATORY_CARE_PROVIDER_SITE_OTHER): Payer: 59 | Admitting: Rheumatology

## 2020-12-06 ENCOUNTER — Other Ambulatory Visit: Payer: Self-pay

## 2020-12-06 DIAGNOSIS — M79641 Pain in right hand: Secondary | ICD-10-CM

## 2020-12-06 DIAGNOSIS — R768 Other specified abnormal immunological findings in serum: Secondary | ICD-10-CM

## 2020-12-06 DIAGNOSIS — R21 Rash and other nonspecific skin eruption: Secondary | ICD-10-CM

## 2020-12-06 DIAGNOSIS — I7300 Raynaud's syndrome without gangrene: Secondary | ICD-10-CM

## 2020-12-06 DIAGNOSIS — M79642 Pain in left hand: Secondary | ICD-10-CM

## 2020-12-06 NOTE — Progress Notes (Signed)
Ultrasound examination of the bilateral hands was within normal limits. No synovitis was noted. Bilateral median nerves were within normal limits. Patient states that she continues to have Raynaud's symptoms and intermittent rash. She gives history of photosensitivity. I will refer her to dermatology for evaluation. I did not notice Raynaud's or digital ulcers on examination today. Pollyann Savoy, MD

## 2020-12-13 ENCOUNTER — Telehealth: Payer: Self-pay

## 2020-12-13 NOTE — Telephone Encounter (Signed)
FAXED

## 2020-12-13 NOTE — Telephone Encounter (Signed)
[facility] called stating they    received referral for the patient.  The following information was missing:  demographics and insurance information.

## 2021-02-01 ENCOUNTER — Other Ambulatory Visit (HOSPITAL_COMMUNITY): Payer: Self-pay | Admitting: Physician Assistant

## 2021-02-01 DIAGNOSIS — G932 Benign intracranial hypertension: Secondary | ICD-10-CM

## 2021-02-01 DIAGNOSIS — R519 Headache, unspecified: Secondary | ICD-10-CM

## 2021-02-16 ENCOUNTER — Ambulatory Visit (HOSPITAL_COMMUNITY): Payer: 59

## 2021-02-26 NOTE — Progress Notes (Deleted)
Office Visit Note  Patient: Lisa Grimes             Date of Birth: [birth year]           MRN: [id number]             PCP: Jerrol Banana., MD Referring: Jerrol Banana.,* Visit Date: 03/09/2021 Occupation: @GUAROCC @  Subjective:  No chief complaint on file.   History of Present Illness: Lisa Grimes is a 25 y.o. female ***   Activities of Daily Living:  Patient reports morning stiffness for *** {minute/hour:19697}.   Patient {ACTIONS;DENIES/REPORTS:21021675::"Denies"} nocturnal pain.  Difficulty dressing/grooming: {ACTIONS;DENIES/REPORTS:21021675::"Denies"} Difficulty climbing stairs: {ACTIONS;DENIES/REPORTS:21021675::"Denies"} Difficulty getting out of chair: {ACTIONS;DENIES/REPORTS:21021675::"Denies"} Difficulty using hands for taps, buttons, cutlery, and/or writing: {ACTIONS;DENIES/REPORTS:21021675::"Denies"}  No Rheumatology ROS completed.   PMFS History:  Patient Active Problem List   Diagnosis Date Noted  . Preeclampsia, severe, third trimester 06/13/2017  . Gestational hypertension 06/05/2017  . Supervision of high risk pregnancy, antepartum 04/22/2017  . Depression with anxiety 04/22/2017  . UTI (urinary tract infection) during pregnancy, first trimester 04/22/2017  . Anxiety 02/20/2015  . Fatigue 02/20/2015  . Acid reflux 02/20/2015  . Headache, menstrual migraine 02/20/2015  . Depression, major, recurrent, moderate ([place]) 02/20/2015  . Adaptive colitis 02/20/2015  . Headache, migraine 02/20/2015  . Panic disorder with agoraphobia and severe panic attacks 02/20/2015    Past Medical History:  Diagnosis Date  . Abnormal white blood cell count   . Acute bronchitis   . Anemia    per patient   . Anxiety   . Chlamydia 2016  . Depression with anxiety   . Dye allergic reaction   . Epistaxis   . Fatigue   . Gastroesophageal reflux disease   . Hypoglycemia   . IBS (irritable bowel syndrome)    per patient   . Influenza A   . Insomnia   .  Major depressive disorder, recurrent episode, moderate with anxious distress ([place])   . Menstrual headache   . Migraine headache   . Mild major depression ([place])   . Palpitations   . Panic disorder with agoraphobia and severe panic attacks   . Parent-child relational problem   . Rhinitis, allergic   . Screening for depression   . Sinus tachycardia   . Viral syndrome   . Vitamin D deficiency    per patient     Family History  Problem Relation Age of Onset  . Depression Mother   . Anxiety disorder Mother   . Hypertension Mother   . Alcohol abuse Father   . Hypertension Father   . Kidney disease Father   . Bipolar disorder Maternal Aunt   . Cancer Maternal Aunt        breast  . Hypertension Maternal Aunt   . Diabetes Maternal Aunt   . Bipolar disorder Maternal Uncle   . Hypertension Maternal Uncle   . Bipolar disorder Paternal Uncle   . Depression Maternal Grandmother   . Stroke Maternal Grandmother   . Hypertension Maternal Grandmother   . Cancer Maternal Grandmother        breast  . Heart disease Maternal Grandmother   . Depression Paternal Grandmother   . Bipolar disorder Paternal Grandmother   . Kidney disease Paternal Grandmother   . Fibromyalgia Paternal Grandmother   . ADD / ADHD Cousin   . Depression Cousin   . Heart attack Maternal Aunt   . Heart disease Maternal Aunt   . Hypertension  Maternal Aunt   . Heart attack Maternal Uncle   . Heart disease Maternal Uncle   . Diabetes Maternal Uncle   . Stroke Maternal Grandfather   . Hypertension Maternal Grandfather   . Heart disease Maternal Grandfather   . Healthy Brother   . Healthy Brother   . Healthy Daughter    Past Surgical History:  Procedure Laterality Date  . EYE SURGERY    . MOUTH SURGERY  2010  . STRABISMUS SURGERY  1997  . TONSILLECTOMY  2003  . tubes in ear  2000   Social History   Social History Narrative  . Not on file   Immunization History  Administered Date(s) Administered  . DTaP  06/20/1995, 08/20/1995, 10/20/1995, 07/20/1996, 03/24/2000  . Hepatitis A 10/09/2006, 07/16/2007  . Hepatitis B 04-Aug-1995, 06/20/1995, 10/20/1995  . HiB (PRP-OMP) 06/20/1995, 08/20/1995, 10/20/1995, 07/20/1996  . IPV 06/20/1995, 08/20/1995, 10/20/1995, 03/24/2000  . Influenza-Unspecified 10/09/2006, 08/16/2013  . MMR 07/20/1996, 03/24/2000  . Meningococcal Conjugate 10/09/2006, 08/16/2013  . Tdap 07/16/2007, 06/16/2017  . Varicella 07/20/1996, 10/09/2006     Objective: Vital Signs: There were no vitals taken for this visit.   Physical Exam   Musculoskeletal Exam: ***  CDAI Exam: CDAI Score: -- Patient Global: --; Provider Global: -- Swollen: --; Tender: -- Joint Exam 03/09/2021   No joint exam has been documented for this visit   There is currently no information documented on the homunculus. Go to the Rheumatology activity and complete the homunculus joint exam.  Investigation: No additional findings.  Imaging: No results found.  Recent Labs: Lab Results  Component Value Date   WBC 6.3 09/09/2019   HGB 14.0 09/09/2019   PLT 231 09/09/2019   NA 137 09/09/2019   K 4.1 09/09/2019   CL 102 09/09/2019   CO2 20 09/09/2019   GLUCOSE 104 (H) 09/09/2019   BUN 10 09/09/2019   CREATININE 0.68 09/09/2019   BILITOT 0.4 09/09/2019   ALKPHOS 94 09/09/2019   AST 17 09/09/2019   ALT 11 09/09/2019   PROT 7.0 09/09/2019   ALBUMIN 4.3 09/09/2019   CALCIUM 9.3 09/09/2019   GFRAA 142 09/09/2019    Speciality Comments: No specialty comments available.  Procedures:  No procedures performed Allergies: Amoxicillin, Latex, Penicillins, and Red dye   Assessment / Plan:     Visit Diagnoses: No diagnosis found.  Orders: No orders of the defined types were placed in this encounter.  No orders of the defined types were placed in this encounter.   Face-to-face time spent with patient was *** minutes. Greater than 50% of time was spent in counseling and coordination of  care.  Follow-Up Instructions: No follow-ups on file.   Earnestine Mealing, CMA  Note - This record has been created using Editor, commissioning.  Chart creation errors have been sought, but may not always  have been located. Such creation errors do not reflect on  the standard of medical care.

## 2021-02-28 ENCOUNTER — Other Ambulatory Visit: Payer: Self-pay

## 2021-02-28 ENCOUNTER — Ambulatory Visit (HOSPITAL_COMMUNITY)
Admission: RE | Admit: 2021-02-28 | Discharge: 2021-02-28 | Disposition: A | Discharge status: Home / Self Care | Payer: 59 | Source: Ambulatory Visit | Attending: Physician Assistant | Admitting: Physician Assistant

## 2021-02-28 DIAGNOSIS — R519 Headache, unspecified: Secondary | ICD-10-CM | POA: Diagnosis not present

## 2021-02-28 DIAGNOSIS — G932 Benign intracranial hypertension: Secondary | ICD-10-CM | POA: Diagnosis present

## 2021-03-09 ENCOUNTER — Ambulatory Visit: Payer: 59 | Admitting: Rheumatology

## 2021-03-09 DIAGNOSIS — M7701 Medial epicondylitis, right elbow: Secondary | ICD-10-CM

## 2021-03-09 DIAGNOSIS — F418 Other specified anxiety disorders: Secondary | ICD-10-CM

## 2021-03-09 DIAGNOSIS — M79641 Pain in right hand: Secondary | ICD-10-CM

## 2021-03-09 DIAGNOSIS — Z8261 Family history of arthritis: Secondary | ICD-10-CM

## 2021-03-09 DIAGNOSIS — O1413 Severe pre-eclampsia, third trimester: Secondary | ICD-10-CM

## 2021-03-09 DIAGNOSIS — R21 Rash and other nonspecific skin eruption: Secondary | ICD-10-CM

## 2021-03-09 DIAGNOSIS — G8929 Other chronic pain: Secondary | ICD-10-CM

## 2021-03-09 DIAGNOSIS — Z8719 Personal history of other diseases of the digestive system: Secondary | ICD-10-CM

## 2021-03-09 DIAGNOSIS — Z8669 Personal history of other diseases of the nervous system and sense organs: Secondary | ICD-10-CM

## 2021-03-09 DIAGNOSIS — I7300 Raynaud's syndrome without gangrene: Secondary | ICD-10-CM

## 2021-03-09 DIAGNOSIS — Z8679 Personal history of other diseases of the circulatory system: Secondary | ICD-10-CM

## 2021-03-09 DIAGNOSIS — K589 Irritable bowel syndrome without diarrhea: Secondary | ICD-10-CM

## 2021-03-09 DIAGNOSIS — R768 Other specified abnormal immunological findings in serum: Secondary | ICD-10-CM

## 2021-03-12 ENCOUNTER — Ambulatory Visit: Payer: 59 | Admitting: Cardiology

## 2021-03-13 ENCOUNTER — Other Ambulatory Visit: Payer: Self-pay | Admitting: Neurology

## 2021-03-13 DIAGNOSIS — G932 Benign intracranial hypertension: Secondary | ICD-10-CM

## 2021-03-13 DIAGNOSIS — H5340 Unspecified visual field defects: Secondary | ICD-10-CM

## 2021-03-15 ENCOUNTER — Encounter: Payer: Self-pay | Admitting: Cardiology

## 2021-03-22 ENCOUNTER — Ambulatory Visit: Payer: 59

## 2021-03-23 NOTE — Progress Notes (Signed)
Patient on schedule for LP for 03/29/2021, called and spoke with patient on phone with pre procedure instructions given,made aware to be here @ 0915, will need ride post procedure/discharge.does not have to be NPO,will be on bedrest 2-4 hours post procedure. Stated understanding.

## 2021-03-29 ENCOUNTER — Other Ambulatory Visit: Payer: Self-pay

## 2021-03-29 ENCOUNTER — Ambulatory Visit
Admission: RE | Admit: 2021-03-29 | Discharge: 2021-03-29 | Disposition: A | Discharge status: Home / Self Care | Payer: 59 | Source: Ambulatory Visit | Attending: Neurology | Admitting: Neurology

## 2021-03-29 DIAGNOSIS — G932 Benign intracranial hypertension: Secondary | ICD-10-CM | POA: Insufficient documentation

## 2021-03-29 DIAGNOSIS — H5340 Unspecified visual field defects: Secondary | ICD-10-CM | POA: Insufficient documentation

## 2021-03-29 LAB — PREGNANCY, URINE: Preg Test, Ur: NEGATIVE

## 2021-03-29 MED ORDER — LIDOCAINE HCL (PF) 1 % IJ SOLN
10.0000 mL | Freq: Once | INTRAMUSCULAR | Status: AC
  Administered 2021-03-29: 5 mL
  Filled 2021-03-29: qty 10

## 2021-03-29 NOTE — Progress Notes (Signed)
Patient remains stable post LP per Dr Nadene Rubins, tolerated well. Denies complaints of HA at this time. Alert/awake and oriented post procedure. Discharge instructions given for LP , ie increase hydration over next 24 hours, bedrest for 24hours. Encouraged patient to keep [facility] or on couch at 30 or less degrees. May be discharge at 1315 per orders.

## 2021-03-29 NOTE — Procedures (Signed)
L4-L5 interspace was accessed with a 22 gauge needle without difficulty. Return of clear CSF verified. Patient rolled to lateral decubitus position and pressure taken.  Fluid removed and closing pressure obtained and documented in procedure dictation.  The patient tolerated the procedure well.

## 2021-03-29 NOTE — Progress Notes (Signed)
Patient taking back to specials bay [facility], [place] aware.

## 2021-03-29 NOTE — Discharge Instructions (Signed)
Lumbar Puncture, Care After This sheet gives you information about how to care for yourself after your procedure. Your health care provider may also give you more specific instructions. If you have problems or questions, contact your health care provider. What can I expect after the procedure? After the procedure, it is common to have:  Mild discomfort or pain at the puncture site.  A mild headache that is relieved with pain medicines. Follow these instructions at home: Activity  Lie down flat or rest for as long as directed by your health care provider.  Return to your normal activities as told by your health care provider. Ask your health care provider what activities are safe for you.  Avoid lifting anything heavier than 10 lb (4.5 kg) for at least 12 hours after the procedure.  Do not drive for 24 hours if you were given a medicine to help you relax (sedative) during your procedure.  Do not drive or use heavy machinery while taking prescription pain medicine.   Puncture site care  Remove or change your bandage (dressing) as told by your health care provider.  Check your puncture area every day for signs of infection. Check for: ? More pain. ? Redness or swelling. ? Fluid or blood leaking from the puncture site. ? Warmth. ? Pus or a bad smell. General instructions  Take over-the-counter and prescription medicines only as told by your health care provider.  Drink enough fluids to keep your urine clear or pale yellow. Your health care provider may recommend drinking caffeine to prevent a headache.  Keep all follow-up visits as told by your health care provider. This is important. Contact a health care provider if:  You have fever or chills.  You have nausea or vomiting.  You have a headache that lasts for more than 2 days or does not get better with medicine. Get help right away if:  You develop any of the following in your  legs: ? Weakness. ? Numbness. ? Tingling.  You are unable to control when you urinate or have a bowel movement (incontinence).  You have signs of infection around your puncture site, such as: ? More pain. ? Redness or swelling. ? Fluid or blood leakage. ? Warmth. ? Pus or a bad smell.  You are dizzy or you feel like you might faint.  You have a severe headache, especially when you sit or stand. Summary  A lumbar puncture is a procedure in which a small needle is inserted into the lower back to remove fluid that surrounds the brain and spinal cord.  After this procedure, it is common to have a headache and pain around the needle insertion area.  Lying flat, staying hydrated, and drinking caffeine can help prevent headaches.  Monitor your needle insertion site for signs of infection, including warmth, fluid, or more pain.  Get help right away if you develop leg weakness, leg numbness, incontinence, or severe headaches. This information is not intended to replace advice given to you by your health care provider. Make sure you discuss any questions you have with your health care provider. Document Revised: 09/07/2020 Document Reviewed: 09/07/2020 Elsevier Patient Education  2021 Elsevier Inc.  

## 2021-04-18 ENCOUNTER — Ambulatory Visit: Payer: Self-pay

## 2021-12-28 ENCOUNTER — Emergency Department: Payer: 59

## 2021-12-28 ENCOUNTER — Other Ambulatory Visit: Payer: Self-pay

## 2021-12-28 ENCOUNTER — Observation Stay
Admission: EM | Admit: 2021-12-28 | Discharge: 2021-12-29 | Disposition: A | Discharge status: Home / Self Care | Payer: 59 | Attending: Obstetrics and Gynecology | Admitting: Obstetrics and Gynecology

## 2021-12-28 ENCOUNTER — Encounter: Payer: Self-pay | Admitting: Emergency Medicine

## 2021-12-28 DIAGNOSIS — R109 Unspecified abdominal pain: Secondary | ICD-10-CM | POA: Diagnosis not present

## 2021-12-28 DIAGNOSIS — N8312 Corpus luteum cyst of left ovary: Secondary | ICD-10-CM | POA: Diagnosis not present

## 2021-12-28 DIAGNOSIS — O23599 Infection of other part of genital tract in pregnancy, unspecified trimester: Secondary | ICD-10-CM | POA: Diagnosis not present

## 2021-12-28 DIAGNOSIS — N83291 Other ovarian cyst, right side: Secondary | ICD-10-CM | POA: Diagnosis not present

## 2021-12-28 DIAGNOSIS — R1032 Left lower quadrant pain: Secondary | ICD-10-CM | POA: Diagnosis not present

## 2021-12-28 DIAGNOSIS — Z880 Allergy status to penicillin: Secondary | ICD-10-CM

## 2021-12-28 DIAGNOSIS — Z3A01 Less than 8 weeks gestation of pregnancy: Secondary | ICD-10-CM | POA: Diagnosis not present

## 2021-12-28 DIAGNOSIS — Z6830 Body mass index (BMI) 30.0-30.9, adult: Secondary | ICD-10-CM | POA: Diagnosis not present

## 2021-12-28 DIAGNOSIS — Z3201 Encounter for pregnancy test, result positive: Secondary | ICD-10-CM | POA: Diagnosis not present

## 2021-12-28 DIAGNOSIS — N898 Other specified noninflammatory disorders of vagina: Secondary | ICD-10-CM

## 2021-12-28 DIAGNOSIS — O26899 Other specified pregnancy related conditions, unspecified trimester: Secondary | ICD-10-CM | POA: Diagnosis not present

## 2021-12-28 DIAGNOSIS — O0091 Unspecified ectopic pregnancy with intrauterine pregnancy: Principal | ICD-10-CM | POA: Insufficient documentation

## 2021-12-28 DIAGNOSIS — O3680X0 Pregnancy with inconclusive fetal viability, not applicable or unspecified: Secondary | ICD-10-CM

## 2021-12-28 DIAGNOSIS — Z20822 Contact with and (suspected) exposure to covid-19: Secondary | ICD-10-CM | POA: Diagnosis not present

## 2021-12-28 DIAGNOSIS — O3481 Maternal care for other abnormalities of pelvic organs, first trimester: Principal | ICD-10-CM | POA: Diagnosis present

## 2021-12-28 DIAGNOSIS — O009 Unspecified ectopic pregnancy without intrauterine pregnancy: Secondary | ICD-10-CM | POA: Diagnosis not present

## 2021-12-28 DIAGNOSIS — O00202 Left ovarian pregnancy without intrauterine pregnancy: Secondary | ICD-10-CM

## 2021-12-28 DIAGNOSIS — O99611 Diseases of the digestive system complicating pregnancy, first trimester: Secondary | ICD-10-CM | POA: Diagnosis not present

## 2021-12-28 LAB — COMPREHENSIVE METABOLIC PANEL
ALT: 13 U/L (ref 0–44)
AST: 22 U/L (ref 15–41)
Albumin: 4.1 g/dL (ref 3.5–5.0)
Alkaline Phosphatase: 48 U/L (ref 38–126)
Anion gap: 7 (ref 5–15)
BUN: 10 mg/dL (ref 6–20)
CO2: 25 mmol/L (ref 22–32)
Calcium: 9.3 mg/dL (ref 8.9–10.3)
Chloride: 103 mmol/L (ref 98–111)
Creatinine, Ser: 0.77 mg/dL (ref 0.44–1.00)
GFR, Estimated: >60 mL/min (ref >60)
Glucose, Bld: 134 mg/dL — ABNORMAL HIGH (ref 70–99)
Potassium: 3.4 mmol/L — ABNORMAL LOW (ref 3.5–5.1)
Sodium: 135 mmol/L (ref 135–145)
Total Bilirubin: 0.6 mg/dL (ref 0.3–1.2)
Total Protein: 7.2 g/dL (ref 6.5–8.1)

## 2021-12-28 LAB — URINALYSIS, ROUTINE W REFLEX MICROSCOPIC
Bacteria, UA: NONE SEEN
Bilirubin Urine: NEGATIVE
Glucose, UA: NEGATIVE mg/dL
Ketones, ur: 5 mg/dL — AB
Nitrite: NEGATIVE
Protein, ur: NEGATIVE mg/dL
Specific Gravity, Urine: 1.008 (ref 1.005–1.030)
pH: 6 (ref 5.0–8.0)

## 2021-12-28 LAB — CBC
HCT: 43.7 % (ref 36.0–46.0)
Hemoglobin: 14.1 g/dL (ref 12.0–15.0)
MCH: 27.3 pg (ref 26.0–34.0)
MCHC: 32.3 g/dL (ref 30.0–36.0)
MCV: 84.7 fL (ref 80.0–100.0)
Platelets: 183 10*3/uL (ref 150–400)
RBC: 5.16 MIL/uL — ABNORMAL HIGH (ref 3.87–5.11)
RDW: 12.2 % (ref 11.5–15.5)
WBC: 7.4 10*3/uL (ref 4.0–10.5)
nRBC: 0 % (ref 0.0–0.2)

## 2021-12-28 LAB — HCG, QUANTITATIVE, PREGNANCY: hCG, Beta Chain, Quant, S: 312 m[IU]/mL — ABNORMAL HIGH (ref <5)

## 2021-12-28 LAB — POC URINE PREG, ED: Preg Test, Ur: POSITIVE — AB

## 2021-12-28 MED ORDER — ONDANSETRON HCL 4 MG/2ML IJ SOLN
4.0000 mg | Freq: Once | INTRAMUSCULAR | Status: AC
  Administered 2021-12-28: 4 mg via INTRAVENOUS
  Filled 2021-12-28: qty 2

## 2021-12-28 MED ORDER — METOPROLOL TARTRATE 50 MG PO TABS
50.0000 mg | ORAL_TABLET | Freq: Once | ORAL | Status: AC
  Administered 2021-12-29: 50 mg via ORAL
  Filled 2021-12-28: qty 1

## 2021-12-28 MED ORDER — LACTATED RINGERS IV BOLUS
1000.0000 mL | Freq: Once | INTRAVENOUS | Status: AC
  Administered 2021-12-29: 1000 mL via INTRAVENOUS

## 2021-12-28 MED ORDER — MORPHINE SULFATE (PF) 4 MG/ML IV SOLN
4.0000 mg | Freq: Once | INTRAVENOUS | Status: AC
  Administered 2021-12-28: 4 mg via INTRAVENOUS
  Filled 2021-12-28: qty 1

## 2021-12-28 NOTE — ED Triage Notes (Signed)
Pt states is approx [redacted] weeks pregnant and is having llq pain that radiates to her back. Pt denies known hematuria. Pt denies vaginal bleeding.

## 2021-12-28 NOTE — ED Provider Notes (Signed)
[facility] Provider Note    Event Date/Time   First MD Initiated Contact with Patient 12/28/21 2256     (approximate)   History   Abdominal Pain   HPI  Lisa Grimes is a 26 y.o. female who presents to the ED for evaluation of Abdominal Pain   Review outpatient neurology visit from 12/5.  Obese patient with pseudotumor cerebri.  Also follows with rheumatology due to undifferentiated connective tissue disease for which she takes Plaquenil. G2, P1 with previous gestation delivering early at about 34 weeks in 2018.  Patient presents to the ED for evaluation of 4-5 days of LLQ abdominal pain that is been progressively worsening this week.  She reports positive home pregnancy test about 1 week ago and that she is 5-6 weeks since her LMP.  She reports concurrent thick white vaginal discharge over the past 4 days, alongside her pain.  Denies any vaginal bleeding.  Denies urinary symptoms such as frequency, dysuria or sensation of incomplete emptying.  Denies fever, emesis, but does report some nausea.  Patient reports a history of tachycardia for which she takes metoprolol 50 mg twice daily, but has not taken it yet today.  Physical Exam   Triage Vital Signs: ED Triage Vitals  Enc Vitals Group     BP 12/28/21 1947 (!) 142/70     Pulse Rate 12/28/21 1947 (!) 120     Resp 12/28/21 1947 16     Temp 12/28/21 1947 98.8 F (37.1 C)     Temp Source 12/28/21 1947 Oral     SpO2 12/28/21 1947 100 %     Weight 12/28/21 1948 174 lb (78.9 kg)     Height 12/28/21 1948 5\' 3"  (1.6 m)     Head Circumference --      Peak Flow --      Pain Score 12/28/21 1948 8     Pain Loc --      Pain Edu? --      Excl. in [place]? --     Most recent vital signs: Vitals:   12/28/21 2310 12/29/21 0003  BP:    Pulse: (!) 115 (!) 120  Resp:    Temp:    SpO2: 100%     General: Awake, no distress.  Generally well-appearing. CV:  Good peripheral perfusion.  Tachycardic and  regular Resp:  Normal effort.  CTAB Abd:  No distention.  LLQ tenderness with some intermittent voluntary guarding with deeper palpation.  No peritoneal features.  Remainder of the abdomen is benign. Pelvic:  Normal-appearing external anatomy without lesions or rashes.  Small-moderate thick white discharge from a closed cervical os without bleeding.  Some mild erythema to the cervix with tenderness with speculum and swabbing concerning for CMT MSK:  No deformity noted.  Neuro:  No focal deficits appreciated. Other:     ED Results / Procedures / Treatments   Labs (all labs ordered are listed, but only abnormal results are displayed) Labs Reviewed  COMPREHENSIVE METABOLIC PANEL - Abnormal; Notable for the following components:      Result Value   Potassium 3.4 (*)    Glucose, Bld 134 (*)    All other components within normal limits  CBC - Abnormal; Notable for the following components:   RBC 5.16 (*)    All other components within normal limits  URINALYSIS, ROUTINE W REFLEX MICROSCOPIC - Abnormal; Notable for the following components:   Color, Urine YELLOW (*)    APPearance HAZY (*)  Hgb urine dipstick SMALL (*)    Ketones, ur 5 (*)    Leukocytes,Ua LARGE (*)    All other components within normal limits  HCG, QUANTITATIVE, PREGNANCY - Abnormal; Notable for the following components:   hCG, Beta Chain, Quant, S 312 (*)    All other components within normal limits  POC URINE PREG, ED - Abnormal; Notable for the following components:   Preg Test, Ur POSITIVE (*)    All other components within normal limits  URINE CULTURE  WET PREP, GENITAL  CHLAMYDIA/NGC RT PCR ([facility] ONLY)            RESP PANEL BY RT-PCR (FLU A&B, COVID) [id number]  TYPE AND SCREEN    EKG   RADIOLOGY Obstetric ultrasound reviewed by me with no IUP and small lesion on the left ovary  Official radiology report(s): US OB Comp Less 14 Wks  Result Date: 12/28/2021 CLINICAL DATA:  Left lower quadrant pain. EXAM:  OBSTETRIC <14 WK ULTRASOUND TECHNIQUE: Transabdominal ultrasound was performed for evaluation of the gestation as well as the maternal uterus and adnexal regions. COMPARISON:  May 01, 2017 and November 16, 2016 FINDINGS: Intrauterine gestational sac: None Yolk sac:  Not Visualized. Embryo:  Not Visualized. Cardiac Activity: Not Visualized. Heart Rate: N/A bpm Subchorionic hemorrhage:  None visualized. Maternal uterus/adnexae: The uterus measures 8.3 cm x 4.8 cm x 5.7 cm The endometrium measures 16.8 mm in thickness. The right ovary measures 3.9 cm x 3.2 cm x 3.8 cm and contains a simple right ovarian cyst. The left ovary measures 3.2 cm x 2.5 cm x 2.0 cm and contains a 0.5 cm x 0.4 cm x 0.5 cm echogenic focus. A left corpus luteum cyst is also noted. There is a small amount of pelvic free fluid. IMPRESSION: 1. No evidence of an intrauterine pregnancy. 2. Simple right ovarian cyst. 3. Left corpus luteum ovarian cyst. 4. Small hyperechoic left ovarian lesion which may represent an ovarian dermoid. Correlation with follow-up pelvic ultrasound is recommended to determine stability. Electronically Signed   By: Virgina Norfolk M.D.   On: 12/28/2021 22:33    PROCEDURES and INTERVENTIONS:  Procedures  Medications  morphine (PF) 2 MG/ML injection 1-2 mg (has no administration in time range)  ondansetron (ZOFRAN) tablet 4 mg (has no administration in time range)    Or  ondansetron (ZOFRAN) injection 4 mg (has no administration in time range)  lactated ringers bolus 1,000 mL (1,000 mLs Intravenous New Bag/Given 12/29/21 0012)  metoprolol tartrate (LOPRESSOR) tablet 50 mg (50 mg Oral Given 12/29/21 0003)  morphine (PF) 4 MG/ML injection 4 mg (4 mg Intravenous Given 12/28/21 2354)  ondansetron (ZOFRAN) injection 4 mg (4 mg Intravenous Given 12/28/21 2353)     IMPRESSION / MDM / ASSESSMENT AND PLAN / ED COURSE  I reviewed the triage vital signs and the nursing notes.  26 year old female G2 P1 at about 5 or [redacted]  weeks gestation presents to the ED with LLQ abdominal pain and thick vaginal discharge concerning for pregnancy of unknown location possibly representing ectopic pregnancy with possible concurrent PID, and ultimately requiring obstetric admission.  She looks clinically well overall, but is quite tachycardic possibly due to underlying pathology versus lack of her normal metoprolol.  Blood work is reassuring with no leukocytosis to suggest sepsis, metabolic panel is reassuring.  Her beta quant is quite low at only 300 considering her LMP of 5 or 6 weeks.  Urine with some leukocytes, but she has no dysuria to suggest urinary  tract infectious disease.  We will send for culture and abstain from antibiotics at this time for UTI.  Pelvic exam performed and with thick white discharge and some cervical erythema and tenderness concerning for the possibility of PID.  Admitting team agrees to follow-up on her wet prep and GC testing to evaluate for this.  Her ultrasound is inconclusive: No IUP or confirmed ectopic, but the small lesion on her left ovary and her overall clinical picture with LLQ tenderness and some voluntary guarding is certainly concerning for the possibility of ectopic pregnancy at the possibility of concurrent PID.  Consulted with OB/GYN who agrees to admit.  Clinical Course as of 12/29/21 0019  Fri Dec 28, 2021  2331 Rh positive [DS]  2336 I consult with Dr. Leafy Ro , OBGYN. We discuss presentation and my concerns. She agrees with workup and patient probably shouldn't be going home tonight. She will reach out to her midwife to see if she's in house to come eval the patient. Will put in orders [DS]  Sat Dec 29, 2021  0002 I reevaluate the patient and Linda Hedges is in the room chatting with the patient about admission. [DS]  0012 Pelvic exam performed.  Thick white discharge coming from a closed cervix.  Slight erythema to external cervical os and some tenderness concerning for cervical motion  tenderness.  I passed this information along to admitting team who will follow up on GC and wet prep testing. [DS]    Clinical Course User Index [DS] Vladimir Crofts, MD     FINAL CLINICAL IMPRESSION(S) / ED DIAGNOSES   Final diagnoses:  Pregnancy of unknown anatomic location  Vaginal discharge during pregnancy in first trimester     Rx / DC Orders   ED Discharge Orders     None        Note:  This document was prepared using Dragon voice recognition software and may include unintentional dictation errors.   Vladimir Crofts, MD 12/29/21 [phone]

## 2021-12-29 ENCOUNTER — Encounter: Payer: Self-pay | Admitting: Obstetrics and Gynecology

## 2021-12-29 ENCOUNTER — Other Ambulatory Visit: Payer: Self-pay | Admitting: Certified Nurse Midwife

## 2021-12-29 DIAGNOSIS — R1032 Left lower quadrant pain: Secondary | ICD-10-CM | POA: Diagnosis not present

## 2021-12-29 DIAGNOSIS — O3481 Maternal care for other abnormalities of pelvic organs, first trimester: Secondary | ICD-10-CM | POA: Diagnosis present

## 2021-12-29 DIAGNOSIS — O009 Unspecified ectopic pregnancy without intrauterine pregnancy: Secondary | ICD-10-CM | POA: Diagnosis not present

## 2021-12-29 DIAGNOSIS — Z880 Allergy status to penicillin: Secondary | ICD-10-CM | POA: Diagnosis not present

## 2021-12-29 DIAGNOSIS — O3680X0 Pregnancy with inconclusive fetal viability, not applicable or unspecified: Secondary | ICD-10-CM

## 2021-12-29 DIAGNOSIS — N8312 Corpus luteum cyst of left ovary: Secondary | ICD-10-CM | POA: Diagnosis present

## 2021-12-29 DIAGNOSIS — N83291 Other ovarian cyst, right side: Secondary | ICD-10-CM | POA: Diagnosis present

## 2021-12-29 DIAGNOSIS — Z20822 Contact with and (suspected) exposure to covid-19: Secondary | ICD-10-CM | POA: Diagnosis present

## 2021-12-29 DIAGNOSIS — O00202 Left ovarian pregnancy without intrauterine pregnancy: Secondary | ICD-10-CM

## 2021-12-29 DIAGNOSIS — Z3A01 Less than 8 weeks gestation of pregnancy: Secondary | ICD-10-CM | POA: Diagnosis not present

## 2021-12-29 LAB — CHLAMYDIA/NGC RT PCR (ARMC ONLY)
Chlamydia Tr: NOT DETECTED
N gonorrhoeae: NOT DETECTED

## 2021-12-29 LAB — COMPREHENSIVE METABOLIC PANEL
ALT: 11 U/L (ref 0–44)
AST: 15 U/L (ref 15–41)
Albumin: 3.4 g/dL — ABNORMAL LOW (ref 3.5–5.0)
Alkaline Phosphatase: 38 U/L (ref 38–126)
Anion gap: 7 (ref 5–15)
BUN: 9 mg/dL (ref 6–20)
CO2: 24 mmol/L (ref 22–32)
Calcium: 8.7 mg/dL — ABNORMAL LOW (ref 8.9–10.3)
Chloride: 106 mmol/L (ref 98–111)
Creatinine, Ser: 0.76 mg/dL (ref 0.44–1.00)
GFR, Estimated: >60 mL/min (ref >60)
Glucose, Bld: 95 mg/dL (ref 70–99)
Potassium: 3.6 mmol/L (ref 3.5–5.1)
Sodium: 137 mmol/L (ref 135–145)
Total Bilirubin: 0.5 mg/dL (ref 0.3–1.2)
Total Protein: 5.7 g/dL — ABNORMAL LOW (ref 6.5–8.1)

## 2021-12-29 LAB — RESP PANEL BY RT-PCR (FLU A&B, COVID) ARPGX2
Influenza A by PCR: NEGATIVE
Influenza B by PCR: NEGATIVE
SARS Coronavirus 2 by RT PCR: NEGATIVE

## 2021-12-29 LAB — HCG, QUANTITATIVE, PREGNANCY: hCG, Beta Chain, Quant, S: 319 m[IU]/mL — ABNORMAL HIGH (ref <5)

## 2021-12-29 LAB — WET PREP, GENITAL
Clue Cells Wet Prep HPF POC: NONE SEEN
Sperm: NONE SEEN
Trich, Wet Prep: NONE SEEN
WBC, Wet Prep HPF POC: >=10 — AB (ref <10)
Yeast Wet Prep HPF POC: NONE SEEN

## 2021-12-29 LAB — TYPE AND SCREEN
ABO/RH(D): A POS
Antibody Screen: NEGATIVE

## 2021-12-29 MED ORDER — METOPROLOL TARTRATE 50 MG PO TABS
50.0000 mg | ORAL_TABLET | Freq: Two times a day (BID) | ORAL | Status: DC
  Filled 2021-12-29 (×2): qty 1

## 2021-12-29 MED ORDER — HYDROXYCHLOROQUINE SULFATE 200 MG PO TABS: 200.0000 mg | ORAL_TABLET | Freq: Two times a day (BID) | ORAL | Status: DC

## 2021-12-29 MED ORDER — METHOTREXATE FOR ECTOPIC PREGNANCY
50.0000 mg/m2 | Freq: Once | INTRAMUSCULAR | Status: AC
  Administered 2021-12-29: 92.5 mg via INTRAMUSCULAR
  Filled 2021-12-29: qty 3.7

## 2021-12-29 MED ORDER — MORPHINE SULFATE (PF) 2 MG/ML IV SOLN
1.0000 mg | INTRAVENOUS | Status: DC | PRN
  Administered 2021-12-29: 2 mg via INTRAVENOUS
  Filled 2021-12-29: qty 1

## 2021-12-29 MED ORDER — ONDANSETRON HCL 4 MG/2ML IJ SOLN
4.0000 mg | Freq: Four times a day (QID) | INTRAMUSCULAR | Status: DC | PRN
Start: 2021-12-29 — End: 2021-12-29
  Administered 2021-12-29: 4 mg via INTRAVENOUS
  Filled 2021-12-29: qty 2

## 2021-12-29 MED ORDER — ONDANSETRON HCL 4 MG PO TABS
4.0000 mg | ORAL_TABLET | Freq: Four times a day (QID) | ORAL | Status: DC | PRN
  Filled 2021-12-29: qty 1

## 2021-12-29 MED ORDER — HYDROXYCHLOROQUINE SULFATE 200 MG PO TABS
200.0000 mg | ORAL_TABLET | Freq: Two times a day (BID) | ORAL | Status: DC
  Administered 2021-12-29: 200 mg via ORAL
  Filled 2021-12-29 (×2): qty 1

## 2021-12-29 NOTE — Progress Notes (Signed)
Obstetric and Gynecology  Subjective  Lisa Grimes is a 26 y.o. female G2P0101 at 102w3d by LMP who presented on 12/28/2021 for 5 days of sharp LLQ pain.  Now dx with PUL, beta of  312, 45mm uterine stripe without gestational sac, complex cyst in left ovary that appears to be ectopic with ring of fire. Vitals and exam stable. Hx of chlamydia, neg for vaginal infections now. +CMT on ER pelvic exam, no fever and wbc 11.  No pain meds required overnight.  Rh pos   Objective   Vitals:   12/29/21 0141 12/29/21 0332  BP: 136/75 (!) 113/55  Pulse: (!) 110 78  Resp: 20 18  Temp: (!) 97.4 F (36.3 C) 98.6 F (37 C)  SpO2: 100% 100%     Intake/Output Summary (Last 24 hours) at 12/29/2021 0834 Last data filed at 12/29/2021 0335 Gross per 24 hour  Intake 1000 ml  Output 200 ml  Net 800 ml    General: NAD Cardiovascular: RRR, no murmurs Pulmonary: CTAB Abdomen: Benign. Diffusely mildly-tender, +BS, minimally voluntary guarding. Extremities: No erythema or cords, no calf tenderness, +warmth with normal peripheral pulses.  Labs: Results for orders placed or performed during the hospital encounter of 12/28/21 (from the past 24 hour(s))  Comprehensive metabolic panel     Status: Abnormal   Collection Time: 12/28/21  7:51 PM  Result Value Ref Range   Sodium 135 135 - 145 mmol/L   Potassium 3.4 (L) 3.5 - 5.1 mmol/L   Chloride 103 98 - 111 mmol/L   CO2 25 22 - 32 mmol/L   Glucose, Bld 134 (H) 70 - 99 mg/dL   BUN 10 6 - 20 mg/dL   Creatinine, Ser 6.71 0.44 - 1.00 mg/dL   Calcium 9.3 8.9 - 24.5 mg/dL   Total Protein 7.2 6.5 - 8.1 g/dL   Albumin 4.1 3.5 - 5.0 g/dL   AST 22 15 - 41 U/L   ALT 13 0 - 44 U/L   Alkaline Phosphatase 48 38 - 126 U/L   Total Bilirubin 0.6 0.3 - 1.2 mg/dL   GFR, Estimated >80 >99 mL/min   Anion gap 7 5 - 15  CBC     Status: Abnormal   Collection Time: 12/28/21  7:51 PM  Result Value Ref Range   WBC 7.4 4.0 - 10.5 K/uL   RBC 5.16 (H) 3.87 - 5.11 MIL/uL    Hemoglobin 14.1 12.0 - 15.0 g/dL   HCT 83.3 82.5 - 05.3 %   MCV 84.7 80.0 - 100.0 fL   MCH 27.3 26.0 - 34.0 pg   MCHC 32.3 30.0 - 36.0 g/dL   RDW 97.6 73.4 - 19.3 %   Platelets 183 150 - 400 K/uL   nRBC 0.0 0.0 - 0.2 %  Urinalysis, Routine w reflex microscopic Urine, Clean Catch     Status: Abnormal   Collection Time: 12/28/21  7:51 PM  Result Value Ref Range   Color, Urine YELLOW (A) YELLOW   APPearance HAZY (A) CLEAR   Specific Gravity, Urine 1.008 1.005 - 1.030   pH 6.0 5.0 - 8.0   Glucose, UA NEGATIVE NEGATIVE mg/dL   Hgb urine dipstick SMALL (A) NEGATIVE   Bilirubin Urine NEGATIVE NEGATIVE   Ketones, ur 5 (A) NEGATIVE mg/dL   Protein, ur NEGATIVE NEGATIVE mg/dL   Nitrite NEGATIVE NEGATIVE   Leukocytes,Ua LARGE (A) NEGATIVE   RBC / HPF 0-5 0 - 5 RBC/hpf   WBC, UA 11-20 0 - 5 WBC/hpf  Bacteria, UA NONE SEEN NONE SEEN   Squamous Epithelial / LPF 0-5 0 - 5   Mucus PRESENT   hCG, quantitative, pregnancy     Status: Abnormal   Collection Time: 12/28/21  7:51 PM  Result Value Ref Range   hCG, Beta Chain, Quant, S 312 (H) <5 mIU/mL  POC urine preg, ED (not at [facility])     Status: Abnormal   Collection Time: 12/28/21  7:57 PM  Result Value Ref Range   Preg Test, Ur POSITIVE (A) NEGATIVE  Type and screen [facility]     Status: None (Preliminary result)   Collection Time: 12/28/21 11:32 PM  Result Value Ref Range   ABO/RH(D) PENDING    Antibody Screen PENDING    Sample Expiration      12/31/2021,2359 Performed at [facility], [address] [place], [place] [address]   Wet prep, genital     Status: Abnormal   Collection Time: 12/29/21 12:10 AM  Result Value Ref Range   Yeast Wet Prep HPF POC NONE SEEN NONE SEEN   Trich, Wet Prep NONE SEEN NONE SEEN   Clue Cells Wet Prep HPF POC NONE SEEN NONE SEEN   WBC, Wet Prep HPF POC >=10 (A) <10   Sperm NONE SEEN   Chlamydia/NGC rt PCR ([facility] only)     Status: None   Collection Time: 12/29/21 12:10  AM   Specimen: Cervical/Vaginal swab  Result Value Ref Range   Specimen source GC/Chlam ENDOCERVICAL    Chlamydia Tr NOT DETECTED NOT DETECTED   N gonorrhoeae NOT DETECTED NOT DETECTED  Resp Panel by RT-PCR (Flu A&B, Covid) Nasopharyngeal Swab     Status: None   Collection Time: 12/29/21 12:10 AM   Specimen: Nasopharyngeal Swab; Nasopharyngeal(NP) swabs in vial transport medium  Result Value Ref Range   SARS Coronavirus 2 by RT PCR NEGATIVE NEGATIVE   Influenza A by PCR NEGATIVE NEGATIVE   Influenza B by PCR NEGATIVE NEGATIVE  Type and screen     Status: None   Collection Time: 12/29/21  2:08 AM  Result Value Ref Range   ABO/RH(D) A POS    Antibody Screen NEG    Sample Expiration      01/01/2022,2359 Performed at [facility], [address] [place], [place] [address]     Cultures: Results for orders placed or performed during the hospital encounter of 12/28/21  Wet prep, genital     Status: Abnormal   Collection Time: 12/29/21 12:10 AM  Result Value Ref Range Status   Yeast Wet Prep HPF POC NONE SEEN NONE SEEN Final   Trich, Wet Prep NONE SEEN NONE SEEN Final   Clue Cells Wet Prep HPF POC NONE SEEN NONE SEEN Final   WBC, Wet Prep HPF POC >=10 (A) <10 Final   Sperm NONE SEEN  Final    Comment: Performed at [facility], [address] [place], [place] [address]  Chlamydia/NGC rt PCR [facility] only)     Status: None   Collection Time: 12/29/21 12:10 AM   Specimen: Cervical/Vaginal swab  Result Value Ref Range Status   Specimen source GC/Chlam ENDOCERVICAL  Final   Chlamydia Tr NOT DETECTED NOT DETECTED Final   N gonorrhoeae NOT DETECTED NOT DETECTED Final    Comment: (NOTE) This CT/NG assay has not been evaluated in patients with a history of  hysterectomy. Performed at [facility], [address] [place], [place] [address]   Resp Panel by RT-PCR (  Flu A&B, Covid) Nasopharyngeal Swab     Status: None   Collection Time: 12/29/21 12:10 AM    Specimen: Nasopharyngeal Swab; Nasopharyngeal(NP) swabs in vial transport medium  Result Value Ref Range Status   SARS Coronavirus 2 by RT PCR NEGATIVE NEGATIVE Final    Comment: (NOTE) SARS-CoV-2 target nucleic acids are NOT DETECTED.  The SARS-CoV-2 RNA is generally detectable in upper respiratory specimens during the acute phase of infection. The lowest concentration of SARS-CoV-2 viral copies this assay can detect is 138 copies/mL. A negative result does not preclude SARS-Cov-2 infection and should not be used as the sole basis for treatment or other patient management decisions. A negative result may occur with  improper specimen collection/handling, submission of specimen other than nasopharyngeal swab, presence of viral mutation(s) within the areas targeted by this assay, and inadequate number of viral copies(<138 copies/mL). A negative result must be combined with clinical observations, patient history, and epidemiological information. The expected result is Negative.  Fact Sheet for Patients:  BloggerCourse.com  Fact Sheet for Healthcare Providers:  SeriousBroker.it  This test is no t yet approved or cleared by the [place] FDA and  has been authorized for detection and/or diagnosis of SARS-CoV-2 by FDA under an Emergency Use Authorization (EUA). This EUA will remain  in effect (meaning this test can be used) for the duration of the COVID-19 declaration under Section 564(b)(1) of the Act, 21 U.S.C.section 360bbb-3(b)(1), unless the authorization is terminated  or revoked sooner.       Influenza A by PCR NEGATIVE NEGATIVE Final   Influenza B by PCR NEGATIVE NEGATIVE Final    Comment: (NOTE) The Xpert Xpress SARS-CoV-2/FLU/RSV plus assay is intended as an aid in the diagnosis of influenza from Nasopharyngeal swab specimens and should not be used as a sole basis for treatment. Nasal washings and aspirates are  unacceptable for Xpert Xpress SARS-CoV-2/FLU/RSV testing.  Fact Sheet for Patients: BloggerCourse.com  Fact Sheet for Healthcare Providers: SeriousBroker.it  This test is not yet approved or cleared by the [place] FDA and has been authorized for detection and/or diagnosis of SARS-CoV-2 by FDA under an Emergency Use Authorization (EUA). This EUA will remain in effect (meaning this test can be used) for the duration of the COVID-19 declaration under Section 564(b)(1) of the Act, 21 U.S.C. section 360bbb-3(b)(1), unless the authorization is terminated or revoked.  Performed at [facility], [address], [place], [place] [address]     Imaging: [place] Comp Less 14 Wks  Result Date: 12/28/2021 CLINICAL DATA:  Left lower quadrant pain. EXAM: OBSTETRIC <14 WK ULTRASOUND TECHNIQUE: Transabdominal ultrasound was performed for evaluation of the gestation as well as the maternal uterus and adnexal regions. COMPARISON:  May 01, 2017 and November 16, 2016 FINDINGS: Intrauterine gestational sac: None Yolk sac:  Not Visualized. Embryo:  Not Visualized. Cardiac Activity: Not Visualized. Heart Rate: N/A bpm Subchorionic hemorrhage:  None visualized. Maternal uterus/adnexae: The uterus measures 8.3 cm x 4.8 cm x 5.7 cm The endometrium measures 16.8 mm in thickness. The right ovary measures 3.9 cm x 3.2 cm x 3.8 cm and contains a simple right ovarian cyst. The left ovary measures 3.2 cm x 2.5 cm x 2.0 cm and contains a 0.5 cm x 0.4 cm x 0.5 cm echogenic focus. A left corpus luteum cyst is also noted. There is a small amount of pelvic free fluid. IMPRESSION: 1. No evidence of an intrauterine pregnancy. 2. Simple right ovarian cyst. 3. Left corpus luteum ovarian  cyst. 4. Small hyperechoic left ovarian lesion which may represent an ovarian dermoid. Correlation with follow-up pelvic ultrasound is recommended to determine stability. Electronically  Signed   By: Aram Candela M.D.   On: 12/28/2021 22:33     Assessment   25 y.o. [id number] Hospital Day: 2   Plan   1. 26 y.o. G2P0101 at 5w by LMP without IUP, possible ovarian ectopic Pregnancy of Unknown Location: Reviewed the results from TVUS and lab studies with Lisa Grimes and answered all of her questions. Her HCG is 315, Hgb 14.1, and TVUS findings c/w PUL but left ectopic possible.   Pt's overall appearance and physical exam are benign and present no need for acute mgmt at this time. Based on her lab values and US findings we are unable to determine the exact location of her pregnancy. I counseled the pt about how an ectopic pregnancy or missed AB can result in life-threatening complications. Reviewed with the pt about the importance of serial HCG examinations, the first of which being in 48 hours. Pt agrees to come for testing at our [facility] lab. If she decides on expectant management, these labs will be entered into Epic. She confirms that she is a candidate for expectant outpatient management.  2. Rh status: Pt's MBT is rh pos. She is not a candidate for 50 mcg of Rho(D) IgG.

## 2021-12-29 NOTE — Progress Notes (Signed)
Future blood work to be drawn at [facility] outpatient lab:  Wednesday 01/02/22: quant beta hCG  Saturday 01/05/22: quant beta hCG, CMP, CBC  Weekly quant beta hCG until <5  Janyce Llanos, CNM 12/29/2021 3:51 PM

## 2021-12-29 NOTE — Discharge Summary (Signed)
Lisa Grimes is a 26 y.o. female. She is at approximately [redacted]w[redacted]d gestation. Patient's last menstrual period was 11/06/2021 (approximate).  Prenatal care site: She has not established prenatal care yet  Chief complaint: She came to the ER with 5 days of sharp LLQ and was diagnosed with pregnancy of unknown location. Cyst in left ovary appears to be ectopic with ring of fire. Vitals and exam stable. Her pain is well controlled with Tylenol.  Maternal Medical History:   Past Medical History:  Diagnosis Date   Abnormal white blood cell count    Acute bronchitis    Anemia    per patient    Anxiety    Chlamydia 2016   Depression with anxiety    Dye allergic reaction    Epistaxis    Fatigue    Gastroesophageal reflux disease    Hypoglycemia    IBS (irritable bowel syndrome)    per patient    Influenza A    Insomnia    Major depressive disorder, recurrent episode, moderate with anxious distress (HCC)    Menstrual headache    Migraine headache    Mild major depression (HCC)    Palpitations    Panic disorder with agoraphobia and severe panic attacks    Parent-child relational problem    Rhinitis, allergic    Screening for depression    Sinus tachycardia    Viral syndrome    Vitamin D deficiency    per patient     Past Surgical History:  Procedure Laterality Date   EYE SURGERY     MOUTH SURGERY  2010   [place]   TONSILLECTOMY  2003   tubes in ear  2000    Allergies  Allergen Reactions   Amoxicillin Hives    Has patient had a PCN reaction causing immediate rash, facial/tongue/throat swelling, SOB or lightheadedness with hypotension: Yes Has patient had a PCN reaction causing severe rash involving mucus membranes or skin necrosis: No Has patient had a PCN reaction that required hospitalization No Has patient had a PCN reaction occurring within the last 10 years: No If all of the above answers are "NO", then may proceed with Cephalosporin use.    Latex     Penicillins Hives    Has patient had a PCN reaction causing immediate rash, facial/tongue/throat swelling, SOB or lightheadedness with hypotension: Yes Has patient had a PCN reaction causing severe rash involving mucus membranes or skin necrosis: No Has patient had a PCN reaction that required hospitalization No Has patient had a PCN reaction occurring within the last 10 years: no If all of the above answers are "NO", then may proceed with Cephalosporin use.    Red Dye     possible allergy    Prior to Admission medications   Medication Sig Start Date End Date Taking? Authorizing Provider  metoprolol tartrate (LOPRESSOR) 50 MG tablet Take 1 tablet (50 mg total) by mouth 2 (two) times daily. 11/22/20 02/20/21  Kate Sable, MD  sulfamethoxazole-trimethoprim (BACTRIM DS) 800-160 MG tablet Take 1 tablet by mouth 2 (two) times daily. 11/09/20   Chrismon, Vickki Muff, PA-C     Social History: She  reports that she has never smoked. She has never used smokeless tobacco. She reports that she does not drink alcohol and does not use drugs.  Family History: family history includes ADD / ADHD in her cousin; Alcohol abuse in her father; Anxiety disorder in her mother; Bipolar disorder in her maternal aunt, maternal uncle,  paternal grandmother, and paternal uncle; Cancer in her maternal aunt and maternal grandmother; Depression in her cousin, maternal grandmother, mother, and paternal grandmother; Diabetes in her maternal aunt and maternal uncle; Fibromyalgia in her paternal grandmother; Healthy in her brother, brother, and daughter; Heart attack in her maternal aunt and maternal uncle; Heart disease in her maternal aunt, maternal grandfather, maternal grandmother, and maternal uncle; Hypertension in her father, maternal aunt, maternal aunt, maternal grandfather, maternal grandmother, maternal uncle, and mother; Kidney disease in her father and paternal grandmother; Stroke in her maternal grandfather and  maternal grandmother.  no history of gyn cancers  Review of Systems: A full review of systems was performed and negative except as noted in the HPI.     O:  BP (!) 103/57    Pulse 87    Temp 98.6 F (37 C) (Oral)    Resp 20    Ht 5\' 3"  (1.6 m)    Wt 78.9 kg    LMP 11/06/2021 (Approximate)    SpO2 100%    BMI 30.82 kg/m  Results for orders placed or performed during the hospital encounter of 12/28/21 (from the past 48 hour(s))  Comprehensive metabolic panel   Collection Time: 12/28/21  7:51 PM  Result Value Ref Range   Sodium 135 135 - 145 mmol/L   Potassium 3.4 (L) 3.5 - 5.1 mmol/L   Chloride 103 98 - 111 mmol/L   CO2 25 22 - 32 mmol/L   Glucose, Bld 134 (H) 70 - 99 mg/dL   BUN 10 6 - 20 mg/dL   Creatinine, Ser 0.77 0.44 - 1.00 mg/dL   Calcium 9.3 8.9 - 10.3 mg/dL   Total Protein 7.2 6.5 - 8.1 g/dL   Albumin 4.1 3.5 - 5.0 g/dL   AST 22 15 - 41 U/L   ALT 13 0 - 44 U/L   Alkaline Phosphatase 48 38 - 126 U/L   Total Bilirubin 0.6 0.3 - 1.2 mg/dL   GFR, Estimated >60 >60 mL/min   Anion gap 7 5 - 15  CBC   Collection Time: 12/28/21  7:51 PM  Result Value Ref Range   WBC 7.4 4.0 - 10.5 K/uL   RBC 5.16 (H) 3.87 - 5.11 MIL/uL   Hemoglobin 14.1 12.0 - 15.0 g/dL   HCT 43.7 36.0 - 46.0 %   MCV 84.7 80.0 - 100.0 fL   MCH 27.3 26.0 - 34.0 pg   MCHC 32.3 30.0 - 36.0 g/dL   RDW 12.2 11.5 - 15.5 %   Platelets 183 150 - 400 K/uL   nRBC 0.0 0.0 - 0.2 %  Urinalysis, Routine w reflex microscopic Urine, Clean Catch   Collection Time: 12/28/21  7:51 PM  Result Value Ref Range   Color, Urine YELLOW (A) YELLOW   APPearance HAZY (A) CLEAR   Specific Gravity, Urine 1.008 1.005 - 1.030   pH 6.0 5.0 - 8.0   Glucose, UA NEGATIVE NEGATIVE mg/dL   Hgb urine dipstick SMALL (A) NEGATIVE   Bilirubin Urine NEGATIVE NEGATIVE   Ketones, ur 5 (A) NEGATIVE mg/dL   Protein, ur NEGATIVE NEGATIVE mg/dL   Nitrite NEGATIVE NEGATIVE   Leukocytes,Ua LARGE (A) NEGATIVE   RBC / HPF 0-5 0 - 5 RBC/hpf   WBC,  UA 11-20 0 - 5 WBC/hpf   Bacteria, UA NONE SEEN NONE SEEN   Squamous Epithelial / LPF 0-5 0 - 5   Mucus PRESENT   hCG, quantitative, pregnancy   Collection Time: 12/28/21  7:51 PM  Result Value Ref Range   hCG, Beta Chain, Quant, S 312 (H) <5 mIU/mL  POC urine preg, ED (not at [facility])   Collection Time: 12/28/21  7:57 PM  Result Value Ref Range   Preg Test, Ur POSITIVE (A) NEGATIVE  Type and screen [place]   Collection Time: 12/28/21 11:32 PM  Result Value Ref Range   ABO/RH(D) PENDING    Antibody Screen PENDING    Sample Expiration      12/31/2021,2359 Performed at [facility], [address] [place], [place] [address]   Wet prep, genital   Collection Time: 12/29/21 12:10 AM  Result Value Ref Range   Yeast Wet Prep HPF POC NONE SEEN NONE SEEN   Trich, Wet Prep NONE SEEN NONE SEEN   Clue Cells Wet Prep HPF POC NONE SEEN NONE SEEN   WBC, Wet Prep HPF POC >=10 (A) <10   Sperm NONE SEEN   Chlamydia/NGC rt PCR ([facility] only)   Collection Time: 12/29/21 12:10 AM   Specimen: Cervical/Vaginal swab  Result Value Ref Range   Specimen source GC/Chlam ENDOCERVICAL    Chlamydia Tr NOT DETECTED NOT DETECTED   N gonorrhoeae NOT DETECTED NOT DETECTED  Resp Panel by RT-PCR (Flu A&B, Covid) Nasopharyngeal Swab   Collection Time: 12/29/21 12:10 AM   Specimen: Nasopharyngeal Swab; Nasopharyngeal(NP) swabs in vial transport medium  Result Value Ref Range   SARS Coronavirus 2 by RT PCR NEGATIVE NEGATIVE   Influenza A by PCR NEGATIVE NEGATIVE   Influenza B by PCR NEGATIVE NEGATIVE  Type and screen   Collection Time: 12/29/21  2:08 AM  Result Value Ref Range   ABO/RH(D) A POS    Antibody Screen NEG    Sample Expiration      01/01/2022,2359 Performed at [facility], [address] [place], [place] [address]   hCG, quantitative, pregnancy   Collection Time: 12/29/21 12:17 PM  Result Value Ref Range   hCG, Beta Chain, Quant, S 319 (H) <5 mIU/mL   Comprehensive metabolic panel   Collection Time: 12/29/21 12:17 PM  Result Value Ref Range   Sodium 137 135 - 145 mmol/L   Potassium 3.6 3.5 - 5.1 mmol/L   Chloride 106 98 - 111 mmol/L   CO2 24 22 - 32 mmol/L   Glucose, Bld 95 70 - 99 mg/dL   BUN 9 6 - 20 mg/dL   Creatinine, Ser 0.76 0.44 - 1.00 mg/dL   Calcium 8.7 (L) 8.9 - 10.3 mg/dL   Total Protein 5.7 (L) 6.5 - 8.1 g/dL   Albumin 3.4 (L) 3.5 - 5.0 g/dL   AST 15 15 - 41 U/L   ALT 11 0 - 44 U/L   Alkaline Phosphatase 38 38 - 126 U/L   Total Bilirubin 0.5 0.3 - 1.2 mg/dL   GFR, Estimated >60 >60 mL/min   Anion gap 7 5 - 15      Constitutional: NAD, AAOx3  HE/ENT: extraocular movements grossly intact, moist mucous membranes CV: RRR PULM: nl respiratory effort, CTABL     Abd: gravid, non-tender, non-distended, soft      Ext: Non-tender, Nonedematous   Psych: mood appropriate, speech normal Pelvic: WNL  A/P: 26 y.o. G1P0 at 5wks pregnancy here for surveillance for pregnancy of unknown location, possible ectopic pregnancy  Principle Diagnosis:  Pregnancy of unknown location, possible ectopic pregnancy  Plan reviewed in depth with Dr. Leafy Ro.  After considering her options, Quanesha is agreeable to methotrexate administration for ectopic pregnancy.  Repeat quant beta hCG 319 and CMP shows WNL AST/ALT Plan outpatient quant beta hCG on Wednesday 01/02/22 at [facility] lab. We reviewed that there may be a normal expected rise in beta hCG on this visit. She will then see Dr. Glennon Mac at [place] [facility] in the office to review her symptoms and blood work. Plan outpatient quant beta hCG, CBC, and CMP on Saturday 01/05/22 at [facility] lab. The beta hCG should be decreasing at this visit. We reviewed that she may need a repeat dose of methotrexate at this visit. Plan outpatient weekly quant beta hCG levels until level <5. Reviewed strict symptom precaution with her of worsening pain, dizziness, LOC, fever, or added symptoms to come back to the  ER immediately for evaluation.  After methotrexate administration, do not take any NSAID medications, do not take any vitamins with folic acid, do not drink alcohol, do not have sex until pregnancy is cleared, do not be exposed to direct sunlight. Pt and significant other are agreeable with POC. D/c home stable, precautions reviewed, follow-up as scheduled.    Gertie Fey, CNM 12/29/2021 3:32 PM

## 2021-12-29 NOTE — H&P (Signed)
Consult History and Physical   SERVICE: Gynecology   Patient Name: BERDINA CHEEVER Patient MRN:   [id number]  CC: LLQ pain, evaluated in the ED  HPI: Lanee DARNESHIA DEMARY is a 26 y.o. G2P0101 with LLQ pain.  Ultrasound showing: 1. No evidence of an intrauterine pregnancy. 2. Simple right ovarian cyst. 3. Left corpus luteum ovarian cyst. 4. Small hyperechoic left ovarian lesion which may represent an ovarian dermoid. Correlation with follow-up pelvic ultrasound is recommended to determine stability.  Review of Systems: positives in bold GEN:   fevers, chills, weight changes, appetite changes, fatigue, night sweats HEENT:  HA, vision changes, hearing loss, congestion, rhinorrhea, sinus pressure, dysphagia CV:   CP, palpitations PULM:  SOB, cough GI:  abd pain, N/V/D/C GU:  dysuria, urgency, frequency, change in vaginal discharge MSK:  arthralgias, myalgias, back pain, swelling SKIN:  rashes, color changes, pallor NEURO:  numbness, weakness, tingling, seizures, dizziness, tremors PSYCH:  depression, anxiety, behavioral problems, confusion  HEME/LYMPH:  easy bruising or bleeding ENDO:  heat/cold intolerance  Past Obstetrical History: OB History     Gravida  2   Para  1   Term      Preterm  1   AB      Living  1      SAB      IAB      Ectopic      Multiple  0   Live Births  1           Past Gynecologic History: Patient's last menstrual period was 11/06/2021 (approximate).   Past Medical History: Past Medical History:  Diagnosis Date   Abnormal white blood cell count    Acute bronchitis    Anemia    per patient    Anxiety    Chlamydia 2016   Depression with anxiety    Dye allergic reaction    Epistaxis    Fatigue    Gastroesophageal reflux disease    Hypoglycemia    IBS (irritable bowel syndrome)    per patient    Influenza A    Insomnia    Major depressive disorder, recurrent episode, moderate with anxious distress (HCC)    Menstrual  headache    Migraine headache    Mild major depression (HCC)    Palpitations    Panic disorder with agoraphobia and severe panic attacks    Parent-child relational problem    Rhinitis, allergic    Screening for depression    Sinus tachycardia    Viral syndrome    Vitamin D deficiency    per patient     Past Surgical History:   Past Surgical History:  Procedure Laterality Date   EYE SURGERY     MOUTH SURGERY  2010   STRABISMUS SURGERY  1997   TONSILLECTOMY  2003   tubes in ear  2000    Family History:  family history includes ADD / ADHD in her cousin; Alcohol abuse in her father; Anxiety disorder in her mother; Bipolar disorder in her maternal aunt, maternal uncle, paternal grandmother, and paternal uncle; Cancer in her maternal aunt and maternal grandmother; Depression in her cousin, maternal grandmother, mother, and paternal grandmother; Diabetes in her maternal aunt and maternal uncle; Fibromyalgia in her paternal grandmother; Healthy in her brother, brother, and daughter; Heart attack in her maternal aunt and maternal uncle; Heart disease in her maternal aunt, maternal grandfather, maternal grandmother, and maternal uncle; Hypertension in her father, maternal aunt, maternal aunt, maternal grandfather, maternal grandmother,  maternal uncle, and mother; Kidney disease in her father and paternal grandmother; Stroke in her maternal grandfather and maternal grandmother.  Social History:  Social History   Socioeconomic History   Marital status: Married    Spouse name: Not on file   Number of children: Not on file   Years of education: Not on file   Highest education level: Not on file  Occupational History   Not on file  Tobacco Use   Smoking status: Never   Smokeless tobacco: Never  Vaping Use   Vaping Use: Never used  Substance and Sexual Activity   Alcohol use: No   Drug use: No   Sexual activity: Yes    Birth control/protection: None  Other Topics Concern   Not on  file  Social History Narrative   Not on file   Social Determinants of Health   Financial Resource Strain: Not on file  Food Insecurity: Not on file  Transportation Needs: Not on file  Physical Activity: Not on file  Stress: Not on file  Social Connections: Not on file  Intimate Partner Violence: Not on file    Home Medications:  Medications reconciled in EPIC  No current facility-administered medications on file prior to encounter.   Current Outpatient Medications on File Prior to Encounter  Medication Sig Dispense Refill   metoprolol tartrate (LOPRESSOR) 50 MG tablet Take 1 tablet (50 mg total) by mouth 2 (two) times daily. 180 tablet 3   sulfamethoxazole-trimethoprim (BACTRIM DS) 800-160 MG tablet Take 1 tablet by mouth 2 (two) times daily. 14 tablet 0    Allergies:  Allergies  Allergen Reactions   Amoxicillin Hives    Has patient had a PCN reaction causing immediate rash, facial/tongue/throat swelling, SOB or lightheadedness with hypotension: Yes Has patient had a PCN reaction causing severe rash involving mucus membranes or skin necrosis: No Has patient had a PCN reaction that required hospitalization No Has patient had a PCN reaction occurring within the last 10 years: No If all of the above answers are "NO", then may proceed with Cephalosporin use.    Latex    Penicillins Hives    Has patient had a PCN reaction causing immediate rash, facial/tongue/throat swelling, SOB or lightheadedness with hypotension: Yes Has patient had a PCN reaction causing severe rash involving mucus membranes or skin necrosis: No Has patient had a PCN reaction that required hospitalization No Has patient had a PCN reaction occurring within the last 10 years: no If all of the above answers are "NO", then may proceed with Cephalosporin use.    Red Dye     possible allergy    Physical Exam:  Temp:  [98.8 F (37.1 C)] 98.8 F (37.1 C) (02/17 1947) Pulse Rate:  [115-127] 120 (02/18  0003) Resp:  [16] 16 (02/17 2300) BP: (139-142)/(58-70) 139/58 (02/17 2300) SpO2:  [100 %] 100 % (02/17 2310) Weight:  [78.9 kg] 78.9 kg (02/17 1948)   General Appearance:  Well developed, well nourished, no acute distress, alert and oriented x3 Pulmonary:  nl effort Skin:  normal coloration and turgor, no rashes, no suspicious skin lesions noted  Psychiatric:  Normal mood and affect, appropriate Pelvic:  deferred, just done in the ED   Labs/Studies:   CBC and Coags:  Lab Results  Component Value Date   WBC 7.4 12/28/2021   NEUTOPHILPCT 68 01/03/2018   EOSPCT 0 01/03/2018   BASOPCT 1 01/03/2018   LYMPHOPCT 20 01/03/2018   HGB 14.1 12/28/2021   HCT 43.7  12/28/2021   MCV 84.7 12/28/2021   PLT 183 12/28/2021   CMP:  Lab Results  Component Value Date   NA 135 12/28/2021   K 3.4 (L) 12/28/2021   CL 103 12/28/2021   CO2 25 12/28/2021   BUN 10 12/28/2021   CREATININE 0.77 12/28/2021   CREATININE 0.68 09/09/2019   CREATININE 0.85 01/03/2018   PROT 7.2 12/28/2021   BILITOT 0.6 12/28/2021   ALT 13 12/28/2021   AST 22 12/28/2021   ALKPHOS 48 12/28/2021    Other Imaging: US OB Comp Less 14 Wks  Result Date: 12/28/2021 CLINICAL DATA:  Left lower quadrant pain. EXAM: OBSTETRIC <14 WK ULTRASOUND TECHNIQUE: Transabdominal ultrasound was performed for evaluation of the gestation as well as the maternal uterus and adnexal regions. COMPARISON:  May 01, 2017 and November 16, 2016 FINDINGS: Intrauterine gestational sac: None Yolk sac:  Not Visualized. Embryo:  Not Visualized. Cardiac Activity: Not Visualized. Heart Rate: N/A bpm Subchorionic hemorrhage:  None visualized. Maternal uterus/adnexae: The uterus measures 8.3 cm x 4.8 cm x 5.7 cm The endometrium measures 16.8 mm in thickness. The right ovary measures 3.9 cm x 3.2 cm x 3.8 cm and contains a simple right ovarian cyst. The left ovary measures 3.2 cm x 2.5 cm x 2.0 cm and contains a 0.5 cm x 0.4 cm x 0.5 cm echogenic focus. A left  corpus luteum cyst is also noted. There is a small amount of pelvic free fluid. IMPRESSION: 1. No evidence of an intrauterine pregnancy. 2. Simple right ovarian cyst. 3. Left corpus luteum ovarian cyst. 4. Small hyperechoic left ovarian lesion which may represent an ovarian dermoid. Correlation with follow-up pelvic ultrasound is recommended to determine stability. Electronically Signed   By: Aram Candela M.D.   On: 12/28/2021 22:33     Assessment / Plan:   DREAMER DULONG is a 26 y.o. G2P0101 who presents with LLQ abdominal pain.  1. Dr. Dalbert Garnet aware of patient 2. Admit to the floor  3. Manage pain 4. NPO 5. Will review labs as they result   Haroldine Laws CNM 12/29/2021 12:12 AM

## 2021-12-30 LAB — URINE CULTURE

## 2022-01-02 ENCOUNTER — Other Ambulatory Visit
Admission: RE | Admit: 2022-01-02 | Discharge: 2022-01-02 | Disposition: A | Discharge status: Home / Self Care | Payer: 59 | Attending: Certified Nurse Midwife | Admitting: Certified Nurse Midwife

## 2022-01-02 DIAGNOSIS — O3680X0 Pregnancy with inconclusive fetal viability, not applicable or unspecified: Secondary | ICD-10-CM | POA: Insufficient documentation

## 2022-01-02 DIAGNOSIS — Z3A00 Weeks of gestation of pregnancy not specified: Secondary | ICD-10-CM | POA: Diagnosis not present

## 2022-01-02 DIAGNOSIS — O00202 Left ovarian pregnancy without intrauterine pregnancy: Secondary | ICD-10-CM | POA: Insufficient documentation

## 2022-01-02 LAB — HCG, QUANTITATIVE, PREGNANCY: hCG, Beta Chain, Quant, S: 499 m[IU]/mL — ABNORMAL HIGH (ref <5)

## 2022-01-03 DIAGNOSIS — O3680X0 Pregnancy with inconclusive fetal viability, not applicable or unspecified: Secondary | ICD-10-CM | POA: Diagnosis not present

## 2022-01-05 ENCOUNTER — Other Ambulatory Visit
Admission: RE | Admit: 2022-01-05 | Discharge: 2022-01-05 | Disposition: A | Discharge status: Home / Self Care | Payer: 59 | Attending: Certified Nurse Midwife | Admitting: Certified Nurse Midwife

## 2022-01-05 DIAGNOSIS — O00202 Left ovarian pregnancy without intrauterine pregnancy: Secondary | ICD-10-CM

## 2022-01-05 DIAGNOSIS — O3680X0 Pregnancy with inconclusive fetal viability, not applicable or unspecified: Secondary | ICD-10-CM | POA: Diagnosis not present

## 2022-01-05 DIAGNOSIS — Z3A00 Weeks of gestation of pregnancy not specified: Secondary | ICD-10-CM | POA: Insufficient documentation

## 2022-01-05 LAB — CBC
HCT: 40.6 % (ref 36.0–46.0)
Hemoglobin: 13 g/dL (ref 12.0–15.0)
MCH: 27.4 pg (ref 26.0–34.0)
MCHC: 32 g/dL (ref 30.0–36.0)
MCV: 85.5 fL (ref 80.0–100.0)
Platelets: 172 10*3/uL (ref 150–400)
RBC: 4.75 MIL/uL (ref 3.87–5.11)
RDW: 12.4 % (ref 11.5–15.5)
WBC: 5 10*3/uL (ref 4.0–10.5)
nRBC: 0 % (ref 0.0–0.2)

## 2022-01-05 LAB — COMPREHENSIVE METABOLIC PANEL
ALT: 12 U/L (ref 0–44)
AST: 20 U/L (ref 15–41)
Albumin: 3.8 g/dL (ref 3.5–5.0)
Alkaline Phosphatase: 42 U/L (ref 38–126)
Anion gap: 7 (ref 5–15)
BUN: 9 mg/dL (ref 6–20)
CO2: 26 mmol/L (ref 22–32)
Calcium: 9.1 mg/dL (ref 8.9–10.3)
Chloride: 103 mmol/L (ref 98–111)
Creatinine, Ser: 0.74 mg/dL (ref 0.44–1.00)
GFR, Estimated: >60 mL/min (ref >60)
Glucose, Bld: 103 mg/dL — ABNORMAL HIGH (ref 70–99)
Potassium: 4.2 mmol/L (ref 3.5–5.1)
Sodium: 136 mmol/L (ref 135–145)
Total Bilirubin: 0.4 mg/dL (ref 0.3–1.2)
Total Protein: 6.9 g/dL (ref 6.5–8.1)

## 2022-01-05 LAB — HCG, QUANTITATIVE, PREGNANCY: hCG, Beta Chain, Quant, S: 105 m[IU]/mL — ABNORMAL HIGH (ref <5)

## 2022-01-11 DIAGNOSIS — O3680X0 Pregnancy with inconclusive fetal viability, not applicable or unspecified: Secondary | ICD-10-CM | POA: Diagnosis not present

## 2022-01-21 DIAGNOSIS — O3680X0 Pregnancy with inconclusive fetal viability, not applicable or unspecified: Secondary | ICD-10-CM | POA: Diagnosis not present

## 2022-02-05 DIAGNOSIS — M359 Systemic involvement of connective tissue, unspecified: Secondary | ICD-10-CM | POA: Diagnosis not present

## 2022-02-05 DIAGNOSIS — Z79899 Other long term (current) drug therapy: Secondary | ICD-10-CM | POA: Diagnosis not present

## 2022-02-18 ENCOUNTER — Telehealth: Payer: Self-pay | Admitting: Cardiology

## 2022-02-18 NOTE — Telephone Encounter (Signed)
-----   Message from Andi Devon sent at 02/01/2022  9:20 AM EDT ----- ?Regarding: needs appt ?Please schedule overdue F/U appointment for refills. Thank you! ? ?

## 2022-02-18 NOTE — Telephone Encounter (Signed)
Attempted to schedule.  Call disconnected.  ?

## 2022-02-28 DIAGNOSIS — Z111 Encounter for screening for respiratory tuberculosis: Secondary | ICD-10-CM | POA: Diagnosis not present

## 2022-03-02 DIAGNOSIS — Z111 Encounter for screening for respiratory tuberculosis: Secondary | ICD-10-CM | POA: Diagnosis not present

## 2022-03-04 DIAGNOSIS — R000 Tachycardia, unspecified: Secondary | ICD-10-CM | POA: Diagnosis not present

## 2022-03-04 DIAGNOSIS — I7300 Raynaud's syndrome without gangrene: Secondary | ICD-10-CM | POA: Diagnosis not present

## 2022-03-04 DIAGNOSIS — M359 Systemic involvement of connective tissue, unspecified: Secondary | ICD-10-CM | POA: Diagnosis not present

## 2022-03-04 DIAGNOSIS — R69 Illness, unspecified: Secondary | ICD-10-CM | POA: Diagnosis not present

## 2022-03-04 DIAGNOSIS — F32A Depression, unspecified: Secondary | ICD-10-CM | POA: Diagnosis not present

## 2022-03-04 DIAGNOSIS — Z0000 Encounter for general adult medical examination without abnormal findings: Secondary | ICD-10-CM | POA: Diagnosis not present

## 2022-03-13 ENCOUNTER — Telehealth: Payer: Self-pay | Admitting: Cardiology

## 2022-03-13 NOTE — Telephone Encounter (Signed)
We have attempted to schedule follow up with no success ?Deleted recall ?

## 2022-03-14 NOTE — Telephone Encounter (Signed)
Noted  

## 2022-03-21 DIAGNOSIS — R413 Other amnesia: Secondary | ICD-10-CM | POA: Diagnosis not present

## 2022-03-21 DIAGNOSIS — R519 Headache, unspecified: Secondary | ICD-10-CM | POA: Diagnosis not present

## 2022-03-21 DIAGNOSIS — H9313 Tinnitus, bilateral: Secondary | ICD-10-CM | POA: Diagnosis not present

## 2022-03-21 DIAGNOSIS — H5340 Unspecified visual field defects: Secondary | ICD-10-CM | POA: Diagnosis not present

## 2022-03-21 DIAGNOSIS — G932 Benign intracranial hypertension: Secondary | ICD-10-CM | POA: Diagnosis not present

## 2022-03-31 ENCOUNTER — Other Ambulatory Visit: Payer: Self-pay

## 2022-03-31 ENCOUNTER — Emergency Department (HOSPITAL_COMMUNITY)
Admission: EM | Admit: 2022-03-31 | Discharge: 2022-04-01 | Disposition: A | Discharge status: Home / Self Care | Payer: 59 | Attending: Emergency Medicine | Admitting: Emergency Medicine

## 2022-03-31 ENCOUNTER — Encounter (HOSPITAL_COMMUNITY): Payer: Self-pay

## 2022-03-31 DIAGNOSIS — Z91040 Latex allergy status: Secondary | ICD-10-CM | POA: Diagnosis not present

## 2022-03-31 DIAGNOSIS — R1010 Upper abdominal pain, unspecified: Secondary | ICD-10-CM | POA: Diagnosis not present

## 2022-03-31 DIAGNOSIS — R112 Nausea with vomiting, unspecified: Secondary | ICD-10-CM | POA: Diagnosis not present

## 2022-03-31 DIAGNOSIS — R109 Unspecified abdominal pain: Secondary | ICD-10-CM | POA: Insufficient documentation

## 2022-03-31 DIAGNOSIS — R7309 Other abnormal glucose: Secondary | ICD-10-CM | POA: Diagnosis not present

## 2022-03-31 DIAGNOSIS — R809 Proteinuria, unspecified: Secondary | ICD-10-CM | POA: Diagnosis not present

## 2022-03-31 LAB — CBC WITH DIFFERENTIAL/PLATELET
Abs Immature Granulocytes: 0.02 10*3/uL (ref 0.00–0.07)
Basophils Absolute: 0 10*3/uL (ref 0.0–0.1)
Basophils Relative: 0 %
Eosinophils Absolute: 0 10*3/uL (ref 0.0–0.5)
Eosinophils Relative: 0 %
HCT: 45.5 % (ref 36.0–46.0)
Hemoglobin: 14.7 g/dL (ref 12.0–15.0)
Immature Granulocytes: 0 %
Lymphocytes Relative: 6 %
Lymphs Abs: 0.4 10*3/uL — ABNORMAL LOW (ref 0.7–4.0)
MCH: 28.1 pg (ref 26.0–34.0)
MCHC: 32.3 g/dL (ref 30.0–36.0)
MCV: 87 fL (ref 80.0–100.0)
Monocytes Absolute: 0.4 10*3/uL (ref 0.1–1.0)
Monocytes Relative: 6 %
Neutro Abs: 6.3 10*3/uL (ref 1.7–7.7)
Neutrophils Relative %: 88 %
Platelets: 166 10*3/uL (ref 150–400)
RBC: 5.23 MIL/uL — ABNORMAL HIGH (ref 3.87–5.11)
RDW: 12.5 % (ref 11.5–15.5)
WBC: 7.2 10*3/uL (ref 4.0–10.5)
nRBC: 0 % (ref 0.0–0.2)

## 2022-03-31 LAB — URINALYSIS, ROUTINE W REFLEX MICROSCOPIC
Bilirubin Urine: NEGATIVE
Glucose, UA: NEGATIVE mg/dL
Hgb urine dipstick: NEGATIVE
Ketones, ur: 20 mg/dL — AB
Leukocytes,Ua: NEGATIVE
Nitrite: NEGATIVE
Protein, ur: NEGATIVE mg/dL
Specific Gravity, Urine: 1.016 (ref 1.005–1.030)
pH: 6 (ref 5.0–8.0)

## 2022-03-31 LAB — COMPREHENSIVE METABOLIC PANEL
ALT: 15 U/L (ref 0–44)
AST: 22 U/L (ref 15–41)
Albumin: 4.3 g/dL (ref 3.5–5.0)
Alkaline Phosphatase: 53 U/L (ref 38–126)
Anion gap: 6 (ref 5–15)
BUN: 10 mg/dL (ref 6–20)
CO2: 25 mmol/L (ref 22–32)
Calcium: 9 mg/dL (ref 8.9–10.3)
Chloride: 106 mmol/L (ref 98–111)
Creatinine, Ser: 0.7 mg/dL (ref 0.44–1.00)
GFR, Estimated: >60 mL/min (ref >60)
Glucose, Bld: 128 mg/dL — ABNORMAL HIGH (ref 70–99)
Potassium: 3.6 mmol/L (ref 3.5–5.1)
Sodium: 137 mmol/L (ref 135–145)
Total Bilirubin: 0.8 mg/dL (ref 0.3–1.2)
Total Protein: 7.6 g/dL (ref 6.5–8.1)

## 2022-03-31 LAB — LIPASE, BLOOD: Lipase: 25 U/L (ref 11–51)

## 2022-03-31 LAB — POC URINE PREG, ED: Preg Test, Ur: NEGATIVE

## 2022-03-31 MED ORDER — LACTATED RINGERS IV BOLUS
1000.0000 mL | Freq: Once | INTRAVENOUS | Status: AC
  Administered 2022-03-31: 1000 mL via INTRAVENOUS

## 2022-03-31 MED ORDER — KETOROLAC TROMETHAMINE 30 MG/ML IJ SOLN
30.0000 mg | Freq: Once | INTRAMUSCULAR | Status: AC
  Administered 2022-03-31: 30 mg via INTRAVENOUS
  Filled 2022-03-31: qty 1

## 2022-03-31 MED ORDER — ONDANSETRON HCL 4 MG/2ML IJ SOLN
4.0000 mg | Freq: Once | INTRAMUSCULAR | Status: AC
  Administered 2022-03-31: 4 mg via INTRAVENOUS
  Filled 2022-03-31: qty 2

## 2022-03-31 NOTE — ED Provider Notes (Signed)
[facility] EMERGENCY DEPARTMENT Provider Note   CSN: [id number] Arrival date & time: 03/31/22  2119     History  Chief Complaint  Patient presents with   Abdominal Pain    Lisa Grimes is a 26 y.o. female.  The history is provided by the patient.  Abdominal Pain She has history of depression, gestational hypertension and comes in complaining of left-sided abdominal pain which started at 10 AM.  She states that she had a loose bowel movement this morning which was not unusual for her.  Following this, pain started in the left side of the abdomen and is gotten progressively worse.  There has been associated nausea and vomiting.  Pain is worse with walking, better if she sits still.  Pain is not improved with vomiting.  She has not taken any medication for her.  She has not had any pain like this before.  She states her next menses is due in 7 days but she does not recall the exact date of her last menses.  She uses coitus interruptus for contraception.   Home Medications Prior to Admission medications   Medication Sig Start Date End Date Taking? Authorizing Provider  hydroxychloroquine (PLAQUENIL) 200 MG tablet Take 1 tablet (200 mg total) by mouth 2 (two) times daily. 12/29/21   Janyce Llanos, CNM  metoprolol tartrate (LOPRESSOR) 50 MG tablet Take 1 tablet (50 mg total) by mouth 2 (two) times daily. 11/22/20 02/20/21  Debbe Odea, MD      Allergies    Amoxicillin, Latex, Penicillins, and Red dye    Review of Systems   Review of Systems  Gastrointestinal:  Positive for abdominal pain.  All other systems reviewed and are negative.  Physical Exam Updated Vital Signs BP (!) 117/42   Pulse 98   Temp 99.6 F (37.6 C) (Oral)   Resp 18   Ht 5\' 3"  (1.6 m)   Wt 78.5 kg   LMP 11/06/2021 (Approximate)   SpO2 100%   BMI 30.65 kg/m  Physical Exam Vitals and nursing note reviewed.  26 year old female, resting comfortably and in no acute distress. Vital signs are  normal. Oxygen saturation is 100%, which is normal. Head is normocephalic and atraumatic. PERRLA, EOMI. Oropharynx is clear. Neck is nontender and supple without adenopathy or JVD. Back is nontender and there is no CVA tenderness. Lungs are clear without rales, wheezes, or rhonchi. Chest is nontender. Heart has regular rate and rhythm without murmur. Abdomen is soft, flat, with mild right upper quadrant tenderness and minimal left upper and lower quadrant tenderness.  There is no rebound or guarding.  Peristalsis is hypoactive but present. Extremities have no cyanosis or edema, full range of motion is present. Skin is warm and dry without rash. Neurologic: Mental status is normal, cranial nerves are intact, moves all extremities equally.  ED Results / Procedures / Treatments   Labs (all labs ordered are listed, but only abnormal results are displayed) Labs Reviewed  COMPREHENSIVE METABOLIC PANEL - Abnormal; Notable for the following components:      Result Value   Glucose, Bld 128 (*)    All other components within normal limits  URINALYSIS, ROUTINE W REFLEX MICROSCOPIC - Abnormal; Notable for the following components:   Ketones, ur 20 (*)    All other components within normal limits  CBC WITH DIFFERENTIAL/PLATELET - Abnormal; Notable for the following components:   RBC 5.23 (*)    Lymphs Abs 0.4 (*)    All other  components within normal limits  LIPASE, BLOOD  POC URINE PREG, ED   Radiology No results found.  Procedures Procedures    Medications Ordered in ED Medications  lactated ringers bolus 1,000 mL (has no administration in time range)  ondansetron (ZOFRAN) injection 4 mg (has no administration in time range)  ketorolac (TORADOL) 30 MG/ML injection 30 mg (has no administration in time range)    ED Course/ Medical Decision Making/ A&P                           Medical Decision Making Amount and/or Complexity of Data Reviewed Labs: ordered.  Risk Prescription  drug management.   Left-sided abdominal pain of uncertain cause.  Differential is broad and includes, but is not limited to, gastroenteritis, diverticulitis, urinary tract infection, urolithiasis, mesenteric adenitis.  Old records are reviewed, and she does have a prior ED visit for gastroenteritis on 01/03/2018.  She will be given IV fluids, ondansetron for nausea, ketorolac for pain.  I have ordered screening labs including CBC, comprehensive metabolic panel, lipase, urinalysis, pregnancy test.  I have interpreted all of the lab tests, and they are normal with exception of mildly elevated glucose consistent with prediabetes.  On review of old records, she has had a mildly elevated glucose going back as far as 2017.  She will need to be observed to make sure she is not developing diabetes.  Urinalysis is normal.  She is feeling better following above-noted treatment.  On reexam, abdomen continues to be benign.  With response to simple treatments and unremarkable labs, no indication for CT of abdomen or pelvis, no indication for hospital admission.  She is discharged with prescription for ondansetron oral dissolving tablet and is advised to return if symptoms worsen.  Final Clinical Impression(s) / ED Diagnoses Final diagnoses:  Nausea and vomiting, unspecified vomiting type  Abdominal pain, unspecified abdominal location  Elevated random blood glucose level    Rx / DC Orders ED Discharge Orders          Ordered    ondansetron (ZOFRAN-ODT) 8 MG disintegrating tablet  Every 8 hours PRN        04/01/22 0105              Dione Booze, MD 04/01/22 0109

## 2022-03-31 NOTE — ED Triage Notes (Signed)
Pt c/o nausea, vomiting and pain across abdomen that started today around 10 am. Pt reports not having a BM in 3-4 days, had BM today then abdominal pain & vomiting started.

## 2022-04-01 DIAGNOSIS — R1084 Generalized abdominal pain: Secondary | ICD-10-CM | POA: Diagnosis not present

## 2022-04-01 DIAGNOSIS — R112 Nausea with vomiting, unspecified: Secondary | ICD-10-CM | POA: Diagnosis not present

## 2022-04-01 DIAGNOSIS — R7309 Other abnormal glucose: Secondary | ICD-10-CM | POA: Diagnosis not present

## 2022-04-01 MED ORDER — ONDANSETRON 8 MG PO TBDP: 8.0000 mg | ORAL_TABLET | Freq: Three times a day (TID) | ORAL | 0 refills | Status: DC | PRN

## 2022-04-01 NOTE — Discharge Instructions (Signed)
Return if pain or nausea are getting worse, or if you start running a fever.  Your blood sugar was a little high today, and has been running a little high for the last several years.  Please work with your primary care provider to watch it closely so that if you do develop diabetes, treatment can be started promptly.

## 2022-04-03 ENCOUNTER — Emergency Department (HOSPITAL_COMMUNITY)
Admission: EM | Admit: 2022-04-03 | Discharge: 2022-04-03 | Disposition: A | Discharge status: Home / Self Care | Payer: 59 | Attending: Emergency Medicine | Admitting: Emergency Medicine

## 2022-04-03 ENCOUNTER — Encounter (HOSPITAL_COMMUNITY): Payer: Self-pay

## 2022-04-03 ENCOUNTER — Emergency Department (HOSPITAL_COMMUNITY): Payer: 59

## 2022-04-03 ENCOUNTER — Other Ambulatory Visit: Payer: Self-pay

## 2022-04-03 DIAGNOSIS — Z91040 Latex allergy status: Secondary | ICD-10-CM | POA: Insufficient documentation

## 2022-04-03 DIAGNOSIS — R188 Other ascites: Secondary | ICD-10-CM | POA: Diagnosis not present

## 2022-04-03 DIAGNOSIS — R110 Nausea: Secondary | ICD-10-CM

## 2022-04-03 DIAGNOSIS — N134 Hydroureter: Secondary | ICD-10-CM | POA: Diagnosis not present

## 2022-04-03 DIAGNOSIS — R112 Nausea with vomiting, unspecified: Secondary | ICD-10-CM | POA: Insufficient documentation

## 2022-04-03 DIAGNOSIS — R197 Diarrhea, unspecified: Secondary | ICD-10-CM | POA: Insufficient documentation

## 2022-04-03 DIAGNOSIS — R1084 Generalized abdominal pain: Secondary | ICD-10-CM | POA: Diagnosis not present

## 2022-04-03 DIAGNOSIS — R1032 Left lower quadrant pain: Secondary | ICD-10-CM | POA: Diagnosis not present

## 2022-04-03 DIAGNOSIS — R1110 Vomiting, unspecified: Secondary | ICD-10-CM | POA: Diagnosis not present

## 2022-04-03 DIAGNOSIS — R109 Unspecified abdominal pain: Secondary | ICD-10-CM | POA: Diagnosis not present

## 2022-04-03 LAB — URINALYSIS, ROUTINE W REFLEX MICROSCOPIC
Bilirubin Urine: NEGATIVE
Glucose, UA: NEGATIVE mg/dL
Hgb urine dipstick: NEGATIVE
Ketones, ur: 80 mg/dL — AB
Leukocytes,Ua: NEGATIVE
Nitrite: NEGATIVE
Protein, ur: NEGATIVE mg/dL
Specific Gravity, Urine: 1.021 (ref 1.005–1.030)
pH: 5 (ref 5.0–8.0)

## 2022-04-03 LAB — CBC WITH DIFFERENTIAL/PLATELET
Abs Immature Granulocytes: 0.02 10*3/uL (ref 0.00–0.07)
Basophils Absolute: 0 10*3/uL (ref 0.0–0.1)
Basophils Relative: 0 %
Eosinophils Absolute: 0 10*3/uL (ref 0.0–0.5)
Eosinophils Relative: 0 %
HCT: 40.7 % (ref 36.0–46.0)
Hemoglobin: 13.3 g/dL (ref 12.0–15.0)
Immature Granulocytes: 0 %
Lymphocytes Relative: 18 %
Lymphs Abs: 1 10*3/uL (ref 0.7–4.0)
MCH: 28.4 pg (ref 26.0–34.0)
MCHC: 32.7 g/dL (ref 30.0–36.0)
MCV: 87 fL (ref 80.0–100.0)
Monocytes Absolute: 0.5 10*3/uL (ref 0.1–1.0)
Monocytes Relative: 8 %
Neutro Abs: 4 10*3/uL (ref 1.7–7.7)
Neutrophils Relative %: 74 %
Platelets: 156 10*3/uL (ref 150–400)
RBC: 4.68 MIL/uL (ref 3.87–5.11)
RDW: 12.4 % (ref 11.5–15.5)
WBC: 5.5 10*3/uL (ref 4.0–10.5)
nRBC: 0 % (ref 0.0–0.2)

## 2022-04-03 LAB — I-STAT BETA HCG BLOOD, ED (MC, WL, AP ONLY): I-stat hCG, quantitative: <5 m[IU]/mL (ref <5)

## 2022-04-03 LAB — COMPREHENSIVE METABOLIC PANEL
ALT: 19 U/L (ref 0–44)
AST: 24 U/L (ref 15–41)
Albumin: 3.5 g/dL (ref 3.5–5.0)
Alkaline Phosphatase: 43 U/L (ref 38–126)
Anion gap: 7 (ref 5–15)
BUN: 13 mg/dL (ref 6–20)
CO2: 23 mmol/L (ref 22–32)
Calcium: 8.8 mg/dL — ABNORMAL LOW (ref 8.9–10.3)
Chloride: 108 mmol/L (ref 98–111)
Creatinine, Ser: 0.8 mg/dL (ref 0.44–1.00)
GFR, Estimated: >60 mL/min (ref >60)
Glucose, Bld: 102 mg/dL — ABNORMAL HIGH (ref 70–99)
Potassium: 4.2 mmol/L (ref 3.5–5.1)
Sodium: 138 mmol/L (ref 135–145)
Total Bilirubin: 0.5 mg/dL (ref 0.3–1.2)
Total Protein: 6.2 g/dL — ABNORMAL LOW (ref 6.5–8.1)

## 2022-04-03 LAB — MAGNESIUM: Magnesium: 2 mg/dL (ref 1.7–2.4)

## 2022-04-03 LAB — MONONUCLEOSIS SCREEN: Mono Screen: NEGATIVE

## 2022-04-03 LAB — LIPASE, BLOOD: Lipase: 25 U/L (ref 11–51)

## 2022-04-03 MED ORDER — IOHEXOL 300 MG/ML  SOLN
100.0000 mL | Freq: Once | INTRAMUSCULAR | Status: AC | PRN
Start: 2022-04-03 — End: 2022-04-03
  Administered 2022-04-03: 100 mL via INTRAVENOUS

## 2022-04-03 MED ORDER — ONDANSETRON HCL 4 MG/2ML IJ SOLN
4.0000 mg | Freq: Once | INTRAMUSCULAR | Status: AC
  Administered 2022-04-03: 4 mg via INTRAVENOUS
  Filled 2022-04-03: qty 2

## 2022-04-03 MED ORDER — LACTATED RINGERS IV BOLUS
2000.0000 mL | Freq: Once | INTRAVENOUS | Status: AC
  Administered 2022-04-03: 2000 mL via INTRAVENOUS

## 2022-04-03 MED ORDER — METOCLOPRAMIDE HCL 10 MG PO TABS: 10.0000 mg | ORAL_TABLET | Freq: Three times a day (TID) | ORAL | 0 refills | Status: DC | PRN

## 2022-04-03 MED ORDER — MORPHINE SULFATE (PF) 4 MG/ML IV SOLN
4.0000 mg | Freq: Once | INTRAVENOUS | Status: AC
  Administered 2022-04-03: 4 mg via INTRAVENOUS
  Filled 2022-04-03: qty 1

## 2022-04-03 MED ORDER — METOCLOPRAMIDE HCL 5 MG/ML IJ SOLN
10.0000 mg | Freq: Once | INTRAMUSCULAR | Status: AC
  Administered 2022-04-03: 10 mg via INTRAVENOUS
  Filled 2022-04-03: qty 2

## 2022-04-03 MED ORDER — ONDANSETRON 8 MG PO TBDP: 8.0000 mg | ORAL_TABLET | Freq: Three times a day (TID) | ORAL | 0 refills | Status: DC | PRN

## 2022-04-03 NOTE — ED Provider Notes (Signed)
[facility] ([facility]) EMERGENCY DEPARTMENT Provider Note   CSN: [id number] Arrival date & time: 04/03/22  [id number]     History  Chief Complaint  Patient presents with   Abdominal Pain    Lisa Grimes is a 26 y.o. female.   Abdominal Pain Associated symptoms: chills, diarrhea and nausea   Patient presenting for abdominal pain, nausea, vomiting, and diarrhea.  Symptoms have been present for the past 3 to 4 days.  She was seen at [facility], ED 3 days ago.  She underwent laboratory work-up.  She was given IV fluids and Zofran for symptomatic relief.  Since her ED visit, she has had ongoing left-sided abdominal pain.  Although she has persistent nausea, she has not had vomiting.  She has had chills and sweats, worse at night.  She has had persistent diarrhea, described as mucousy and nonbloody.  She took Imodium and had very brief relief of symptoms.  For her pain, she has been taking ibuprofen.  Last dose was last night.  She was not able to fill her Zofran prescription.  She denies any history of similar GI symptoms.  As a child, she was diagnosed with IBS.  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    Home Medications Prior to Admission medications   Medication Sig Start Date End Date Taking? Authorizing Provider  metoCLOPramide (REGLAN) 10 MG tablet Take 1 tablet (10 mg total) by mouth every 8 (eight) hours as needed for nausea. 04/03/22  Yes Godfrey Pick, MD  hydroxychloroquine (PLAQUENIL) 200 MG tablet Take 1 tablet (200 mg total) by mouth 2 (two) times daily. 12/29/21   Gertie Fey, CNM  metoprolol tartrate (LOPRESSOR) 50 MG tablet Take 1 tablet (50 mg total) by mouth 2 (two) times daily. 11/22/20 02/20/21  Kate Sable, MD  ondansetron (ZOFRAN-ODT) 8 MG disintegrating tablet Take 1 tablet (8 mg total) by mouth every 8 (eight) hours as needed for nausea or vomiting. 04/03/22   Godfrey Pick, MD      Allergies     Amoxicillin, Latex, Penicillins, and Red dye    Review of Systems   Review of Systems  Constitutional:  Positive for chills and diaphoresis.  Gastrointestinal:  Positive for abdominal pain, diarrhea and nausea.  All other systems reviewed and are negative.  Physical Exam Updated Vital Signs BP (!) 113/53   Pulse 73   Temp 97.7 F (36.5 C) (Oral)   Resp 18   Ht 5\' 3"  (1.6 m)   Wt 77.1 kg   LMP 11/06/2021 (Approximate)   SpO2 99%   BMI 30.11 kg/m  Physical Exam Vitals and nursing note reviewed.  Constitutional:      General: She is not in acute distress.    Appearance: She is well-developed and normal weight. She is not ill-appearing, toxic-appearing or diaphoretic.  HENT:     Head: Normocephalic and atraumatic.     Mouth/Throat:     Mouth: Mucous membranes are moist.     Pharynx: Oropharynx is clear.  Eyes:     General: No scleral icterus.    Extraocular Movements: Extraocular movements intact.     Conjunctiva/sclera: Conjunctivae normal.  Cardiovascular:     Rate and Rhythm: Normal rate and regular rhythm.     Heart sounds: No murmur heard. Pulmonary:     Effort: Pulmonary effort is normal. No respiratory distress.  Abdominal:     Palpations: Abdomen is soft.  Tenderness: There is abdominal tenderness in the epigastric area, left upper quadrant and left lower quadrant. There is no guarding or rebound.  Musculoskeletal:        General: No swelling.     Cervical back: Neck supple.  Skin:    General: Skin is warm and dry.     Capillary Refill: Capillary refill takes less than 2 seconds.     Coloration: Skin is not cyanotic or jaundiced.  Neurological:     General: No focal deficit present.     Mental Status: She is alert and oriented to person, place, and time.     Cranial Nerves: No cranial nerve deficit.     Motor: No weakness.  Psychiatric:        Mood and Affect: Mood normal.        Behavior: Behavior normal.    ED Results / Procedures / Treatments    Labs (all labs ordered are listed, but only abnormal results are displayed) Labs Reviewed  COMPREHENSIVE METABOLIC PANEL - Abnormal; Notable for the following components:      Result Value   Glucose, Bld 102 (*)    Calcium 8.8 (*)    Total Protein 6.2 (*)    All other components within normal limits  URINALYSIS, ROUTINE W REFLEX MICROSCOPIC - Abnormal; Notable for the following components:   Ketones, ur 80 (*)    All other components within normal limits  LIPASE, BLOOD  CBC WITH DIFFERENTIAL/PLATELET  MAGNESIUM  MONONUCLEOSIS SCREEN  I-STAT BETA HCG BLOOD, ED ([facility], [facility], [facility] ONLY)    EKG None  Radiology CT ABDOMEN PELVIS W CONTRAST  Result Date: 04/03/2022 CLINICAL DATA:  Left lower quadrant abdominal pain with nausea and vomiting. EXAM: CT ABDOMEN AND PELVIS WITH CONTRAST TECHNIQUE: Multidetector CT imaging of the abdomen and pelvis was performed using the standard protocol following bolus administration of intravenous contrast. RADIATION DOSE REDUCTION: This exam was performed according to the departmental dose-optimization program which includes automated exposure control, adjustment of the mA and/or kV according to patient size and/or use of iterative reconstruction technique. CONTRAST:  120mL OMNIPAQUE IOHEXOL 300 MG/ML  SOLN COMPARISON:  None FINDINGS: Lower chest: No acute abnormality. Hepatobiliary: No focal liver abnormality is seen. No gallstones, gallbladder wall thickening, or biliary dilatation. Pancreas: Unremarkable. No pancreatic ductal dilatation or surrounding inflammatory changes. Spleen: The spleen measures 14.7 by 7.5 x 6.5 cm (volume = 380 cm^3) Adrenals/Urinary Tract: Normal appearance of the left kidney. Right-sided pelvocaliectasis and proximal hydroureter identified. No right-sided ureteral calculi identified. Urinary bladder is unremarkable. Stomach/Bowel: Stomach appears within normal limits. The appendix is difficult to visualize separate from the surrounding right  lower quadrant bowel loops. No bowel wall thickening, inflammation, or distension. Vascular/Lymphatic: No significant vascular findings are present. No enlarged abdominal or pelvic lymph nodes. Reproductive: The scratch set normal physiologic appearance of the uterus and or ovaries. Benign corpus luteum identified within the right ovary. Small calcification is noted in the left ovary. Other: There is a small volume of identified mildly complex free fluid within the cul-de-sac. This measures 15 Hounsfield units. No discrete fluid collections to suggest abscess. Musculoskeletal: No acute or significant osseous findings. IMPRESSION: 1. There is a small volume of mildly complex free fluid within the cul-de-sac which measures 15 Hounsfield units. This is nonspecific and may be physiologic in a premenopausal female. This may also be seen in the setting of ruptured ovarian cyst. If further imaging is clinically indicated consider pelvic sonogram. 2. Right-sided pelvocaliectasis and mild  proximal hydroureter. No right-sided ureteral calculi identified. Findings may reflect sequelae of recently passed stone. 3. The appendix is difficult to visualize separate from the surrounding right lower quadrant bowel loops. Electronically Signed   By: Kerby Moors M.D.   On: 04/03/2022 11:15    Procedures Procedures    Medications Ordered in ED Medications  morphine (PF) 4 MG/ML injection 4 mg (4 mg Intravenous Given 04/03/22 1007)  ondansetron (ZOFRAN) injection 4 mg (4 mg Intravenous Given 04/03/22 1007)  lactated ringers bolus 2,000 mL (2,000 mLs Intravenous New Bag/Given 04/03/22 1007)  iohexol (OMNIPAQUE) 300 MG/ML solution 100 mL (100 mLs Intravenous Contrast Given 04/03/22 1047)  metoCLOPramide (REGLAN) injection 10 mg (10 mg Intravenous Given 04/03/22 1405)    ED Course/ Medical Decision Making/ A&P                           Medical Decision Making Amount and/or Complexity of Data Reviewed Labs:  ordered. Radiology: ordered.  Risk Prescription drug management.   This patient presents to the ED for concern of abdominal pain, diarrhea, nausea, this involves an extensive number of treatment options, and is a complaint that carries with it a high risk of complications and morbidity.  The differential diagnosis includes colitis, enteritis, diverticulitis, pyelonephritis, malabsorption   Co morbidities that complicate the patient evaluation  Zaidi, depression, panic attacks, (also states that she has been diagnosed with a connective tissue autoimmune disease)   Additional history obtained:  Additional history obtained from N/A External records from outside source obtained and reviewed including EMR   Lab Tests:  I Ordered, and personally interpreted labs.  The pertinent results include: Normal electrolytes, no leukocytosis, normal hemoglobin, normal lipase, no evidence of UTI   Imaging Studies ordered:  I ordered imaging studies including CT of abdomen and pelvis I independently visualized and interpreted imaging which showed age-indeterminate enlargement of spleen, small volume of free fluid in cul-de-sac, right-sided pelvocaliectasis I agree with the radiologist interpretation   Cardiac Monitoring: / EKG:  The patient was maintained on a cardiac monitor.  I personally viewed and interpreted the cardiac monitored which showed an underlying rhythm of: Sinus rhythm  Problem List / ED Course / Critical interventions / Medication management  Patient is a 26 year old female presenting for 4 days of abdominal pain, nausea, and diarrhea.  She was seen in the emergency department 3 days ago.  At that time, laboratory work-up was reassuring and she was discharged with as needed Zofran.  She was not able to fill her prescription of Zofran.  She has been able to tolerate p.o. intake, however, she reports that this worsens her diarrhea.  Due to persistent symptoms, patient presents to  the ED today.  On arrival, her vital signs are normal.  She is afebrile.  She does endorse recent chills.  On exam, she is well-appearing.  She does endorse tenderness to palpation, primarily in the left side of her abdomen.  She denies any recent vaginal discharge.  IV fluids were given due to her persistent diarrhea and concern of dehydration.  Patient was given morphine and Zofran for symptomatic relief.  On reassessment, she endorsed continued nausea.  She was, however, able to tolerate p.o. intake at this time.  Patient was given Reglan for further relief of her nausea.  Lab work shows normal electrolytes and no leukocytosis.  Due to her abdominal tenderness, she underwent a CT scan.  CT scan does show a small amount  of free pelvic fluid, suggestive of recent ovarian cystic rupture.  She also has an enlarged spleen.  There is no recent imaging for comparison of splenic dimensions.  She does state that she has a connective tissue autoimmune disease.  Patient will benefit from PCP follow-up.  While in the ED, patient declined a pelvic exam.  She reported significant relief of her nausea with the Reglan.  Prescription for as needed Reglan was sent to her pharmacy.  Patient was advised to continue supportive care at home and to ensure that she is able to replace the fluid losses from any ongoing diarrhea.  Of note, patient had no further diarrhea while in the ED.  Patient does feel comfortable with discharge at this time.  He was discharged in good condition. I ordered medication including IV fluids for dehydration; Zofran and Reglan for nausea; morphine for analgesia. Reevaluation of the patient after these medicines showed that the patient resolved I have reviewed the patients home medicines and have made adjustments as needed   Social Determinants of Health:  Has access to outpatient care        Final Clinical Impression(s) / ED Diagnoses Final diagnoses:  Diarrhea, unspecified type  Nausea   Generalized abdominal pain    Rx / DC Orders ED Discharge Orders          Ordered    metoCLOPramide (REGLAN) 10 MG tablet  Every 8 hours PRN        04/03/22 1518    ondansetron (ZOFRAN-ODT) 8 MG disintegrating tablet  Every 8 hours PRN        04/03/22 1518              Godfrey Pick, MD 04/03/22 1529

## 2022-04-03 NOTE — ED Notes (Signed)
Pt c/o N/V/D x4 days with cramps. Menstrual due in 5 days. Lase episode of vomiting was yesterday. Pt reports aprox.8 episodes of diarrhea yesterday and 2 episodes this am. Took imodium with no relief. Pt seen on Sunday for this matter and given zofran. Pt didn't get prescription.

## 2022-04-03 NOTE — Discharge Instructions (Signed)
Prescriptions for nausea medications were sent to your pharmacy.  Take these as needed.  Ensure that you stay hydrated.  Take ibuprofen as needed for pain.  Your spleen appears enlarged on CT scan.  Avoid injuries to this area.  Please follow-up with your primary care doctor for reassessment and repeat studies as needed.  If you experience worsening severity of symptoms, please return to the emergency department.

## 2022-04-03 NOTE — ED Triage Notes (Signed)
Pt arrived POV from home c/o abdominal pain vomiting and diarrhea x 3-4 days. Pt states she was seen at [facility] and they told her to come back if it got worse but stated nothing was wrong.

## 2022-04-05 DIAGNOSIS — R1012 Left upper quadrant pain: Secondary | ICD-10-CM | POA: Diagnosis not present

## 2022-04-05 DIAGNOSIS — M359 Systemic involvement of connective tissue, unspecified: Secondary | ICD-10-CM | POA: Diagnosis not present

## 2022-04-06 DIAGNOSIS — Z20828 Contact with and (suspected) exposure to other viral communicable diseases: Secondary | ICD-10-CM | POA: Diagnosis not present

## 2022-04-16 DIAGNOSIS — J029 Acute pharyngitis, unspecified: Secondary | ICD-10-CM | POA: Diagnosis not present

## 2022-04-28 ENCOUNTER — Emergency Department
Admission: EM | Admit: 2022-04-28 | Discharge: 2022-04-28 | Disposition: A | Discharge status: Home / Self Care | Payer: 59 | Attending: Emergency Medicine | Admitting: Emergency Medicine

## 2022-04-28 DIAGNOSIS — R1013 Epigastric pain: Secondary | ICD-10-CM | POA: Diagnosis not present

## 2022-04-28 DIAGNOSIS — R112 Nausea with vomiting, unspecified: Secondary | ICD-10-CM | POA: Diagnosis not present

## 2022-04-28 DIAGNOSIS — R1012 Left upper quadrant pain: Secondary | ICD-10-CM | POA: Diagnosis not present

## 2022-04-28 LAB — COMPREHENSIVE METABOLIC PANEL
ALT: 22 U/L (ref 0–44)
AST: 25 U/L (ref 15–41)
Albumin: 3.8 g/dL (ref 3.5–5.0)
Alkaline Phosphatase: 52 U/L (ref 38–126)
Anion gap: 7 (ref 5–15)
BUN: 12 mg/dL (ref 6–20)
CO2: 23 mmol/L (ref 22–32)
Calcium: 8.9 mg/dL (ref 8.9–10.3)
Chloride: 106 mmol/L (ref 98–111)
Creatinine, Ser: 0.64 mg/dL (ref 0.44–1.00)
GFR, Estimated: >60 mL/min (ref >60)
Glucose, Bld: 121 mg/dL — ABNORMAL HIGH (ref 70–99)
Potassium: 3.9 mmol/L (ref 3.5–5.1)
Sodium: 136 mmol/L (ref 135–145)
Total Bilirubin: 0.9 mg/dL (ref 0.3–1.2)
Total Protein: 7.5 g/dL (ref 6.5–8.1)

## 2022-04-28 LAB — URINALYSIS, ROUTINE W REFLEX MICROSCOPIC
Bilirubin Urine: NEGATIVE
Glucose, UA: NEGATIVE mg/dL
Hgb urine dipstick: NEGATIVE
Ketones, ur: NEGATIVE mg/dL
Leukocytes,Ua: NEGATIVE
Nitrite: NEGATIVE
Protein, ur: NEGATIVE mg/dL
Specific Gravity, Urine: 1.009 (ref 1.005–1.030)
pH: 7 (ref 5.0–8.0)

## 2022-04-28 LAB — CBC
HCT: 43.1 % (ref 36.0–46.0)
Hemoglobin: 13.7 g/dL (ref 12.0–15.0)
MCH: 27 pg (ref 26.0–34.0)
MCHC: 31.8 g/dL (ref 30.0–36.0)
MCV: 85 fL (ref 80.0–100.0)
Platelets: 171 10*3/uL (ref 150–400)
RBC: 5.07 MIL/uL (ref 3.87–5.11)
RDW: 12.5 % (ref 11.5–15.5)
WBC: 8 10*3/uL (ref 4.0–10.5)
nRBC: 0 % (ref 0.0–0.2)

## 2022-04-28 LAB — PREGNANCY, URINE: Preg Test, Ur: NEGATIVE

## 2022-04-28 LAB — LIPASE, BLOOD: Lipase: 24 U/L (ref 11–51)

## 2022-04-28 MED ORDER — KETOROLAC TROMETHAMINE 15 MG/ML IJ SOLN
15.0000 mg | Freq: Once | INTRAMUSCULAR | Status: AC
  Administered 2022-04-28: 15 mg via INTRAVENOUS
  Filled 2022-04-28: qty 1

## 2022-04-28 MED ORDER — ONDANSETRON HCL 4 MG/2ML IJ SOLN
4.0000 mg | Freq: Once | INTRAMUSCULAR | Status: AC
  Administered 2022-04-28: 4 mg via INTRAVENOUS
  Filled 2022-04-28: qty 2

## 2022-04-28 MED ORDER — SODIUM CHLORIDE 0.9 % IV BOLUS
1000.0000 mL | Freq: Once | INTRAVENOUS | Status: AC
Start: 2022-04-28 — End: 2022-04-28
  Administered 2022-04-28: 1000 mL via INTRAVENOUS

## 2022-04-28 MED ORDER — FAMOTIDINE 20 MG PO TABS: 20.0000 mg | ORAL_TABLET | Freq: Two times a day (BID) | ORAL | 0 refills | Status: DC

## 2022-04-28 NOTE — ED Notes (Signed)
Dc ppw provided to patient. Pt questions and followup reviewed. PT declines vs at time of dc and provides verbal consent for DC. Pt alert and oriented on foot to lobby. 

## 2022-04-28 NOTE — Discharge Instructions (Signed)
Your blood work was all reassuring today.  You can take the Tylenol and Reglan for nausea.  I would like to start you on Pepcid which is an acid blocker.  Please follow-up with your primary care doctor and rheumatologist and I will refer you to gastroenterology.

## 2022-04-28 NOTE — ED Triage Notes (Signed)
N/v/d, enlarged spleen at recent doctor visit. Had been treated with steroids and had helped but is back now and is worse. LUQ pain.

## 2022-04-28 NOTE — ED Notes (Addendum)
N/V/D starting a few weeks ago. Pt was given steroids for this and ABX for URI and felt better. Pt stating the N/v/d started again this morning. Pt unable to keep anything down. Pt stating they feel achy and feverish this morning.  Pt showing pain in LUQ radiating into back//flank. Pt stating when they were seen [facility] last month a subsequent finding was a kidney stone/passed kidney stone and was wondering if this pain might be from that.

## 2022-04-28 NOTE — ED Provider Notes (Signed)
[facility] Provider Note    Event Date/Time   First MD Initiated Contact with Patient 04/28/22 1141     (approximate)   History   Abdominal Pain and Emesis   HPI  RYLINN LINZY is a 26 y.o. female past medical history of pseudotumor, undifferentiated connective tissue disease and sinus tachycardia who presents with abdominal pain nausea vomiting.  Symptoms started over the last day or so.  She endorses diffuse abdominal pain worse in the left upper quadrant and epigastric region.  Multiple episodes nonbloody nonbilious emesis.  Denies urinary symptoms.  Is having loose stool nonbloody.  Denies fevers chills vaginal discharge.  Patient had similar symptoms last month.  She was seen in the ED and had a CT that showed sequela of possible recently passed kidney stone and some trace free fluid in the pelvis which could be physiologic versus ruptured ovarian cyst.  She followed up with her PCP who prescribed her low-dose prednisone and her symptoms resolved.    Past Medical History:  Diagnosis Date   Abnormal white blood cell count    Acute bronchitis    Anemia    per patient    Anxiety    Chlamydia 2016   Depression with anxiety    Dye allergic reaction    Epistaxis    Fatigue    Gastroesophageal reflux disease    Hypoglycemia    IBS (irritable bowel syndrome)    per patient    Influenza A    Insomnia    Major depressive disorder, recurrent episode, moderate with anxious distress (HCC)    Menstrual headache    Migraine headache    Mild major depression (HCC)    Palpitations    Panic disorder with agoraphobia and severe panic attacks    Parent-child relational problem    Rhinitis, allergic    Screening for depression    Sinus tachycardia    Viral syndrome    Vitamin D deficiency    per patient     Patient Active Problem List   Diagnosis Date Noted   Left lower quadrant abdominal pain 12/29/2021   Preeclampsia, severe, third trimester  06/13/2017   Gestational hypertension 06/05/2017   Supervision of high risk pregnancy, antepartum 04/22/2017   Depression with anxiety 04/22/2017   UTI (urinary tract infection) during pregnancy, first trimester 04/22/2017   Anxiety 02/20/2015   Fatigue 02/20/2015   Acid reflux 02/20/2015   Headache, menstrual migraine 02/20/2015   Depression, major, recurrent, moderate (HCC) 02/20/2015   Adaptive colitis 02/20/2015   Headache, migraine 02/20/2015   Panic disorder with agoraphobia and severe panic attacks 02/20/2015     Physical Exam  Triage Vital Signs: ED Triage Vitals  Enc Vitals Group     BP 04/28/22 1030 121/73     Pulse Rate 04/28/22 1030 91     Resp 04/28/22 1030 18     Temp 04/28/22 1030 99.4 F (37.4 C)     Temp Source 04/28/22 1030 Oral     SpO2 04/28/22 1030 97 %     Weight 04/28/22 1030 170 lb (77.1 kg)     Height 04/28/22 1030 5\' 3"  (1.6 m)     Head Circumference --      Peak Flow --      Pain Score 04/28/22 1034 10     Pain Loc --      Pain Edu? --      Excl. in GC? --     Most recent  vital signs: Vitals:   04/28/22 1030  BP: 121/73  Pulse: 91  Resp: 18  Temp: 99.4 F (37.4 C)  SpO2: 97%     General: Awake, no distress.  CV:  Good peripheral perfusion.  Resp:  Normal effort.  Abd:  No distention.  Mild tenderness throughout most pronounced in the left upper quadrant, no palpable masses there is no guarding or peritoneal signs Neuro:             Awake, Alert, Oriented x 3  Other:     ED Results / Procedures / Treatments  Labs (all labs ordered are listed, but only abnormal results are displayed) Labs Reviewed  COMPREHENSIVE METABOLIC PANEL - Abnormal; Notable for the following components:      Result Value   Glucose, Bld 121 (*)    All other components within normal limits  URINALYSIS, ROUTINE W REFLEX MICROSCOPIC - Abnormal; Notable for the following components:   Color, Urine STRAW (*)    APPearance CLEAR (*)    All other components  within normal limits  LIPASE, BLOOD  CBC  PREGNANCY, URINE  POC URINE PREG, ED     EKG     RADIOLOGY    PROCEDURES:  Critical Care performed: No  Procedures   MEDICATIONS ORDERED IN ED: Medications  sodium chloride 0.9 % bolus 1,000 mL (1,000 mLs Intravenous New Bag/Given 04/28/22 1254)  ketorolac (TORADOL) 15 MG/ML injection 15 mg (15 mg Intravenous Given 04/28/22 1256)  ondansetron (ZOFRAN) injection 4 mg (4 mg Intravenous Given 04/28/22 1257)     IMPRESSION / MDM / ASSESSMENT AND PLAN / ED COURSE  I reviewed the triage vital signs and the nursing notes.                              Patient's presentation is most consistent with acute presentation with potential threat to life or bodily function.  Differential diagnosis includes, but is not limited to, gastroenteritis, UTI, pancreatitis, biliary colic, splenomegaly   Patient is a 26 year old female history of a undifferentiated connective tissue disease presents with upper abdominal pain.  This has been going on for a day associated with nonbilious nonbloody emesis no fevers or urinary symptoms no lower abdominal pain.  Patient seen in the ED at the end of last month for similar reviewed the CT from that time which showed questionable splenomegaly and possible recently passed kidney stone there was hydroureter but no obstructing stone as well as some physiologic free fluid in the pelvis.  Patient was started on a steroid by her PCP after that visit because it was felt that this could be autoimmune related patient did seem to get better.  She comes in with the exact same symptoms today.  She is mildly tender primarily in the left upper quadrant Labs are all reassuring UA does not suggest infection.  Given the very similar presentation with otherwise reassuring CT less than a month ago feel that repeat CT imaging is low yield today.  Plan to treat supportively with antiemetics pain control and reassess.  Patient was given a  liter of fluid Toradol Zofran and on reassessment she is feeling improved.  She still has mild tenderness primarily in the left upper quadrant but her abdomen is otherwise benign she is not tender in the right lower quadrant.  She tolerated p.o. here.  Patient was requesting steroid again however I do not feel that there is a specific indication  for steroids and given the multiple potential side effects I prefer to defer this to her rheumatologist.  We will give her follow-up with GI and start on Pepcid.  Given she is feeling improved and tolerating p.o. will discharge.  Clinical Course as of 04/28/22 1421  Sun Apr 28, 2022  1230 Preg Test, Ur: NEGATIVE [KM]    Clinical Course User Index [KM] Georga Hacking, MD     FINAL CLINICAL IMPRESSION(S) / ED DIAGNOSES   Final diagnoses:  LUQ abdominal pain     Rx / DC Orders   ED Discharge Orders          Ordered    famotidine (PEPCID) 20 MG tablet  2 times daily        04/28/22 1421             Note:  This document was prepared using Dragon voice recognition software and may include unintentional dictation errors.   Georga Hacking, MD 04/28/22 1421

## 2022-04-30 DIAGNOSIS — Z79899 Other long term (current) drug therapy: Secondary | ICD-10-CM | POA: Diagnosis not present

## 2022-04-30 DIAGNOSIS — M359 Systemic involvement of connective tissue, unspecified: Secondary | ICD-10-CM | POA: Diagnosis not present

## 2022-04-30 DIAGNOSIS — I7300 Raynaud's syndrome without gangrene: Secondary | ICD-10-CM | POA: Diagnosis not present

## 2022-05-09 DIAGNOSIS — Z79899 Other long term (current) drug therapy: Secondary | ICD-10-CM | POA: Diagnosis not present

## 2022-05-24 DIAGNOSIS — G932 Benign intracranial hypertension: Secondary | ICD-10-CM | POA: Diagnosis not present

## 2022-05-27 DIAGNOSIS — H9313 Tinnitus, bilateral: Secondary | ICD-10-CM | POA: Diagnosis not present

## 2022-05-27 DIAGNOSIS — R413 Other amnesia: Secondary | ICD-10-CM | POA: Diagnosis not present

## 2022-05-27 DIAGNOSIS — R519 Headache, unspecified: Secondary | ICD-10-CM | POA: Diagnosis not present

## 2022-05-27 DIAGNOSIS — G932 Benign intracranial hypertension: Secondary | ICD-10-CM | POA: Diagnosis not present

## 2022-06-03 DIAGNOSIS — H6983 Other specified disorders of Eustachian tube, bilateral: Secondary | ICD-10-CM | POA: Diagnosis not present

## 2022-06-07 DIAGNOSIS — Z79899 Other long term (current) drug therapy: Secondary | ICD-10-CM | POA: Diagnosis not present

## 2022-06-07 DIAGNOSIS — M359 Systemic involvement of connective tissue, unspecified: Secondary | ICD-10-CM | POA: Diagnosis not present

## 2022-06-17 ENCOUNTER — Encounter (HOSPITAL_COMMUNITY): Payer: Self-pay

## 2022-06-17 ENCOUNTER — Emergency Department (HOSPITAL_COMMUNITY)
Admission: EM | Admit: 2022-06-17 | Discharge: 2022-06-17 | Disposition: A | Discharge status: Home / Self Care | Payer: 59 | Attending: Emergency Medicine | Admitting: Emergency Medicine

## 2022-06-17 ENCOUNTER — Other Ambulatory Visit: Payer: Self-pay

## 2022-06-17 ENCOUNTER — Emergency Department (HOSPITAL_COMMUNITY): Payer: 59

## 2022-06-17 DIAGNOSIS — R059 Cough, unspecified: Secondary | ICD-10-CM | POA: Diagnosis not present

## 2022-06-17 DIAGNOSIS — Z28310 Unvaccinated for covid-19: Secondary | ICD-10-CM | POA: Insufficient documentation

## 2022-06-17 DIAGNOSIS — Z91040 Latex allergy status: Secondary | ICD-10-CM | POA: Insufficient documentation

## 2022-06-17 DIAGNOSIS — R509 Fever, unspecified: Secondary | ICD-10-CM | POA: Diagnosis not present

## 2022-06-17 DIAGNOSIS — B349 Viral infection, unspecified: Secondary | ICD-10-CM | POA: Diagnosis not present

## 2022-06-17 DIAGNOSIS — Z20822 Contact with and (suspected) exposure to covid-19: Secondary | ICD-10-CM | POA: Diagnosis not present

## 2022-06-17 LAB — GROUP A STREP BY PCR: Group A Strep by PCR: NOT DETECTED

## 2022-06-17 LAB — SARS CORONAVIRUS 2 BY RT PCR: SARS Coronavirus 2 by RT PCR: NEGATIVE

## 2022-06-17 MED ORDER — ACETAMINOPHEN 500 MG PO TABS
1000.0000 mg | ORAL_TABLET | Freq: Once | ORAL | Status: AC
  Administered 2022-06-17: 1000 mg via ORAL
  Filled 2022-06-17: qty 2

## 2022-06-17 NOTE — ED Triage Notes (Signed)
Fever, dizzy, coughing up green stuff. Since Saturday night. 101.8 highest temp. Sore throat.

## 2022-06-17 NOTE — ED Provider Notes (Signed)
[facility] EMERGENCY DEPARTMENT Provider Note   CSN: [id number] Arrival date & time: 06/17/22  1640     History  Chief Complaint  Patient presents with   Fever   Dizziness    Lisa Grimes is a 26 y.o. female with a history significant for anxiety and depression, GERD, and undefined connective tissue disorder under the care of Dr. Allena Katz at [facility] requiring methotrexate presenting with a 2-day history of URI type symptoms including fever with a Tmax of 101.8, moderate sore throat, cough which is productive of green sputum, mild nasal congestion without rhinorrhea and general myalgias and lightheadedness.  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  She has taken Tylenol, last dose was taken yesterday which improved her fever, temporarily also improved her sore throat.  She has found no other alleviators for symptoms.  The history is provided by the patient.       Home Medications Prior to Admission medications   Medication Sig Start Date End Date Taking? Authorizing Provider  famotidine (PEPCID) 20 MG tablet Take 1 tablet (20 mg total) by mouth 2 (two) times daily. 04/28/22 05/28/22  Georga Hacking, MD  hydroxychloroquine (PLAQUENIL) 200 MG tablet Take 1 tablet (200 mg total) by mouth 2 (two) times daily. 12/29/21   Janyce Llanos, CNM  metoCLOPramide (REGLAN) 10 MG tablet Take 1 tablet (10 mg total) by mouth every 8 (eight) hours as needed for nausea. 04/03/22   Gloris Manchester, MD  metoprolol tartrate (LOPRESSOR) 50 MG tablet Take 1 tablet (50 mg total) by mouth 2 (two) times daily. 11/22/20 02/20/21  Debbe Odea, MD  ondansetron (ZOFRAN-ODT) 8 MG disintegrating tablet Take 1 tablet (8 mg total) by mouth every 8 (eight) hours as needed for nausea or vomiting. 04/03/22   Gloris Manchester, MD      Allergies    Amoxicillin, Latex, Penicillins, and Red dye    Review of Systems   Review of Systems   HENT:  Positive for congestion and sore throat. Negative for ear pain, rhinorrhea, sinus pressure, trouble swallowing and voice change.   Respiratory:  Positive for cough. Negative for wheezing and stridor.   All other systems reviewed and are negative.   Physical Exam Updated Vital Signs BP (!) 119/54 (BP Location: Left Arm)   Pulse 82   Temp 98.5 F (36.9 C) (Oral)   Resp 18   Ht 5\' 3"  (1.6 m)   Wt 77.1 kg   LMP 06/11/2022   SpO2 100%   BMI 30.11 kg/m  Physical Exam Constitutional:      Appearance: She is well-developed.  HENT:     Head: Normocephalic and atraumatic.     Right Ear: Tympanic membrane and ear canal normal.     Left Ear: Tympanic membrane and ear canal normal.     Nose: Mucosal edema present. No rhinorrhea.     Mouth/Throat:     Mouth: Mucous membranes are moist.     Pharynx: Oropharynx is clear. Uvula midline. No oropharyngeal exudate or posterior oropharyngeal erythema.     Tonsils: No tonsillar abscesses.     Comments: Patient has undergone tonsillectomy, she does have remnant tonsillar tissue on posterior pharynx which is beefy red. Eyes:     Conjunctiva/sclera: Conjunctivae normal.  Cardiovascular:     Heart sounds: Normal heart sounds.  Pulmonary:     Effort: Pulmonary effort is normal. No respiratory distress.  Breath sounds: No wheezing, rhonchi or rales.  Musculoskeletal:        General: Normal range of motion.  Skin:    General: Skin is warm and dry.     Findings: No rash.  Neurological:     Mental Status: She is alert and oriented to person, place, and time.     ED Results / Procedures / Treatments   Labs (all labs ordered are listed, but only abnormal results are displayed) Labs Reviewed  GROUP A STREP BY PCR  SARS CORONAVIRUS 2 BY RT PCR    EKG None  Radiology DG Chest Portable 1 View  Result Date: 06/17/2022 CLINICAL DATA:  Fever and productive cough. EXAM: PORTABLE CHEST 1 VIEW COMPARISON:  Chest radiograph 08/09/2019  FINDINGS: The cardiomediastinal contours are within normal limits. The lungs are clear. No pneumothorax or pleural effusion. No acute finding in the visualized skeleton. IMPRESSION: No acute cardiopulmonary process. Electronically Signed   By: Emmaline Kluver M.D.   On: 06/17/2022 18:25    Procedures Procedures    Medications Ordered in ED Medications  acetaminophen (TYLENOL) tablet 1,000 mg (1,000 mg Oral Given 06/17/22 1849)    ED Course/ Medical Decision Making/ A&P                           Medical Decision Making Patient with upper respiratory complaints including sore throat, she works at a daycare, she is COVID-negative and strep negative today.  She has had fever to 101.8, afebrile here.  Her exam suggest a viral URI source given her negative testing today.  Her chest x-ray is also clear with no sign of pneumonia.  Amount and/or Complexity of Data Reviewed Radiology: ordered.  Risk OTC drugs.           Final Clinical Impression(s) / ED Diagnoses Final diagnoses:  Viral illness    Rx / DC Orders ED Discharge Orders     None         Victoriano Lain 06/17/22 2026    Derwood Kaplan, MD 06/18/22 1541

## 2022-06-17 NOTE — Discharge Instructions (Signed)
Your exam and today's labs suggest that you have a viral upper respiratory infection and viral sore throat which should run its course and resolve without need of antibiotics as your strep test today is negative.  Rest make sure you are drinking plenty of fluids.  I recommend taking Tylenol or Motrin for symptom relief.  I also recommend either Claritin-D or Zyrtec D to help you with your nasal congestion symptoms.  You do not need a prescription for this medication.

## 2022-07-21 IMAGING — CT CT ABD-PELV W/ CM
2 of 4 series · 16 of 46 positions shown, 18 images · IV contrast (APPLIED)
Comparison: None

CLINICAL DATA: Left lower quadrant abdominal pain with nausea and
vomiting.

EXAM:
CT ABDOMEN AND PELVIS WITH CONTRAST
TECHNIQUE: Multidetector CT imaging of the abdomen and pelvis was performed
using the standard protocol following bolus administration of
intravenous contrast.

[Series 3: abdomen 5.0 · axial · 0.92mm/px · z∈[+642,+1067]mm · 13 of 96 slices shown, 15 images]
[im 6/96  soft-tissue]
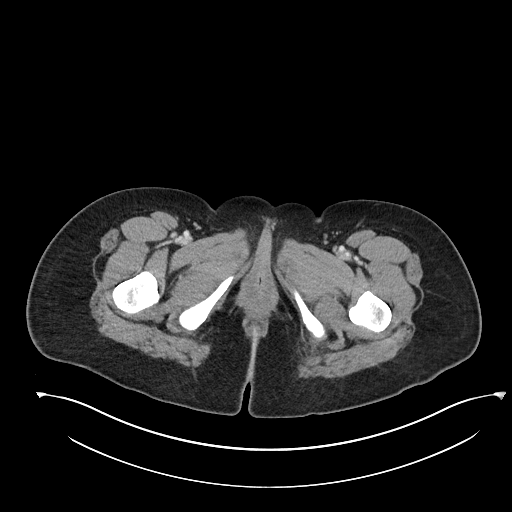
[im 6/96  bone]
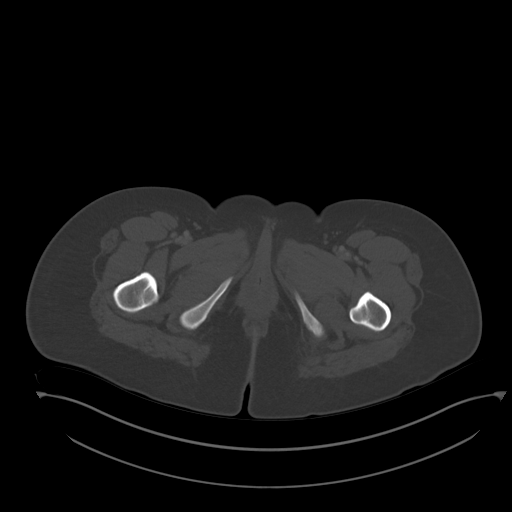
[im 16/96  soft-tissue]
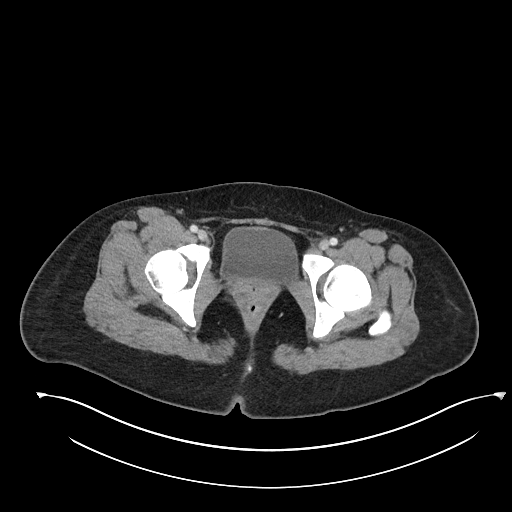
[im 21/96  soft-tissue]
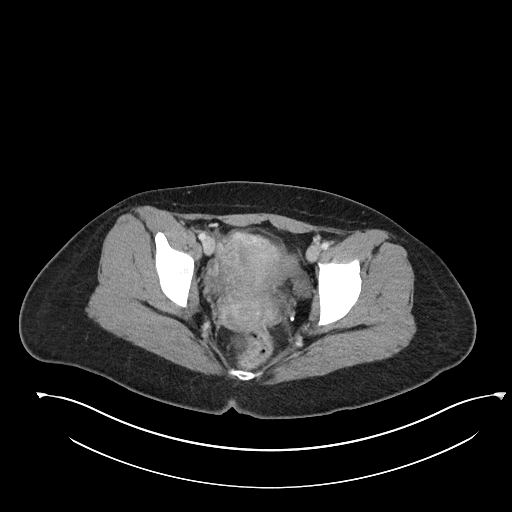
[im 26/96  soft-tissue]
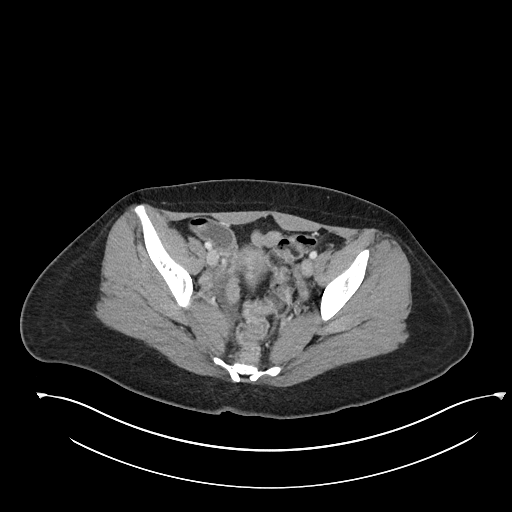
[im 36/96  soft-tissue]
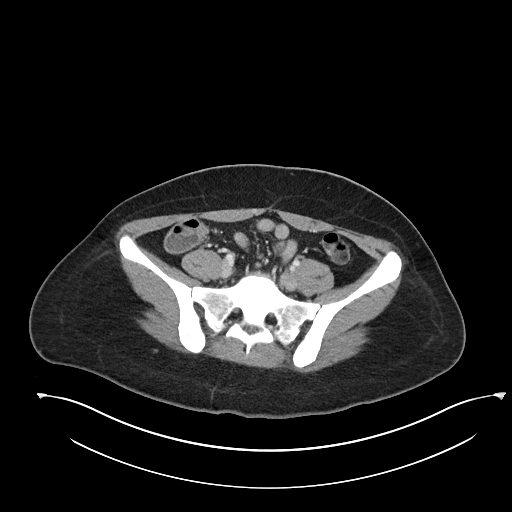
[im 41/96  soft-tissue]
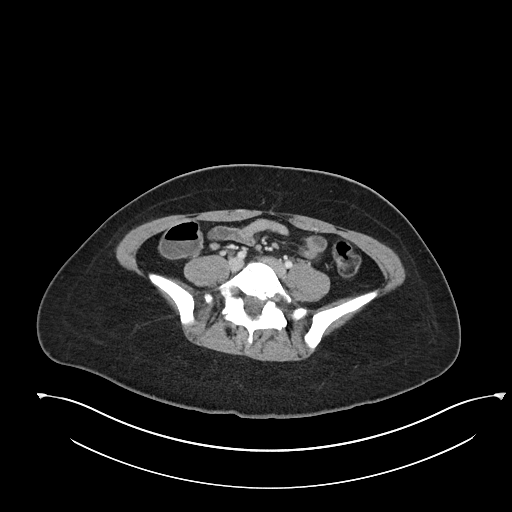
[im 51/96  soft-tissue]
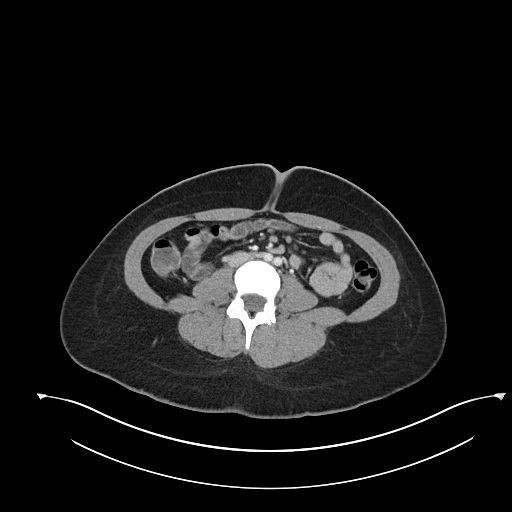
[im 56/96  soft-tissue]
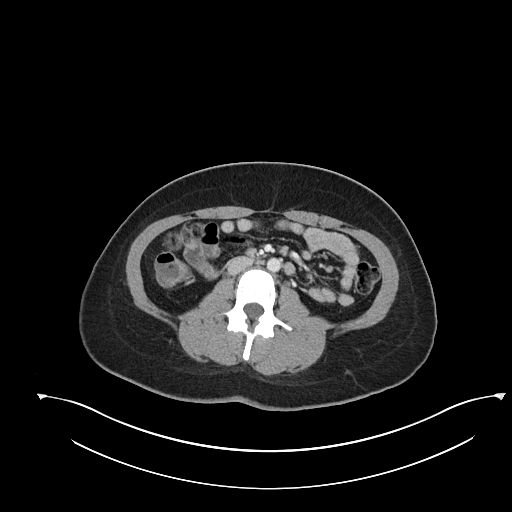
[im 61/96  soft-tissue]
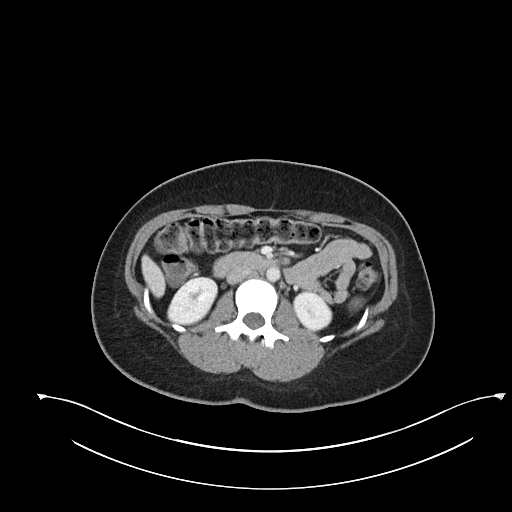
[im 61/96  bone]
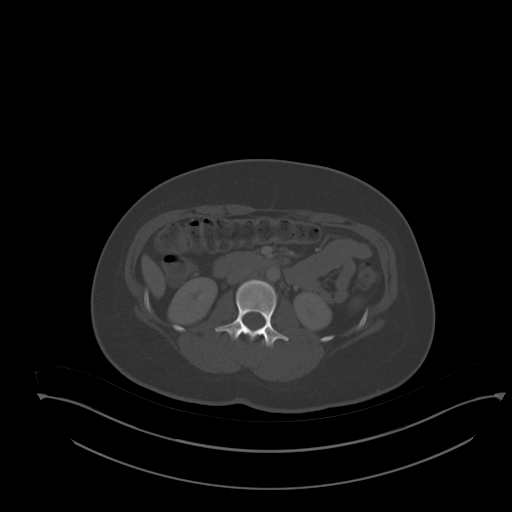
[im 71/96  soft-tissue]
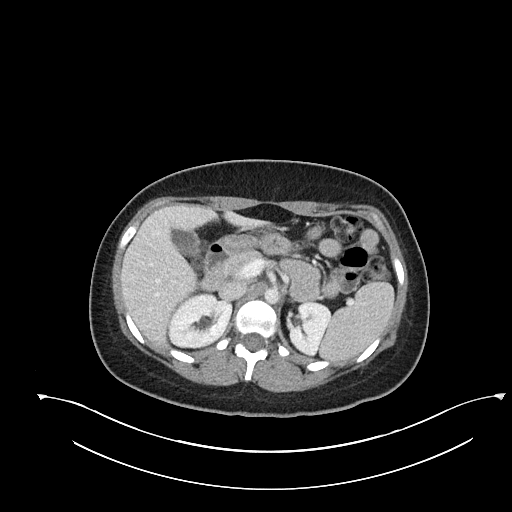
[im 76/96  soft-tissue]
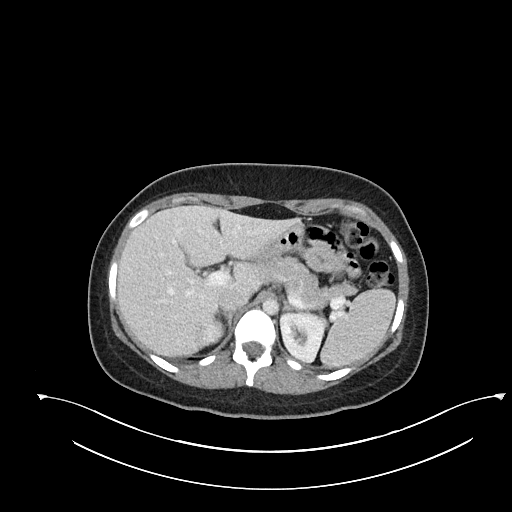
[im 81/96  soft-tissue]
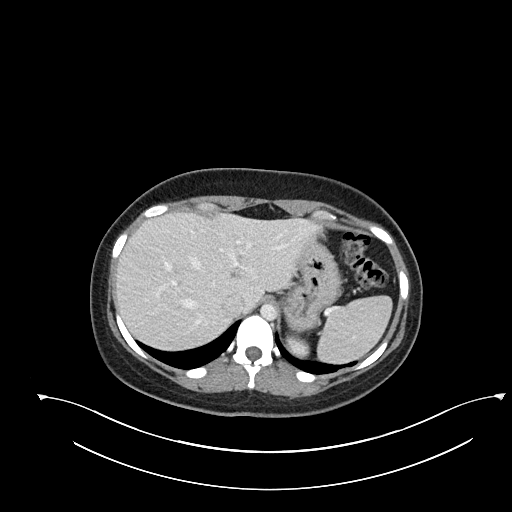
[im 91/96  soft-tissue]
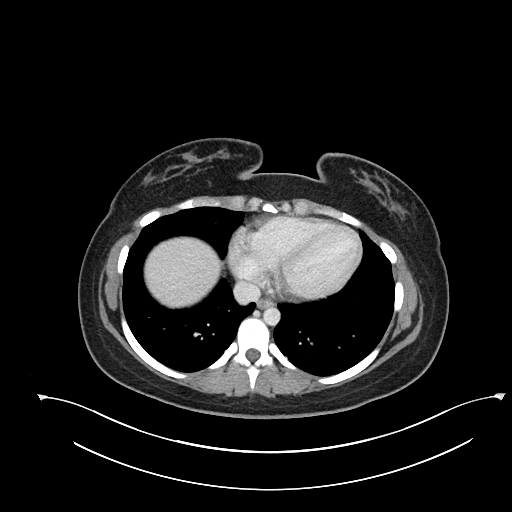

[Series 6: abdomen 3.0 mpr cor · coronal · 0.83mm/px · 3 of 89 slices shown]
[im 30/89  soft-tissue]
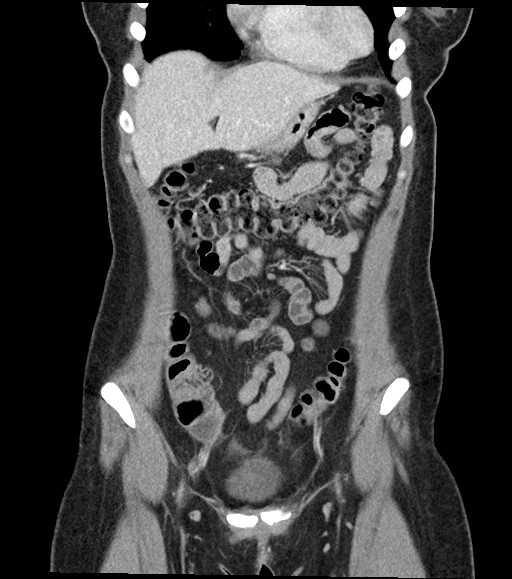
[im 40/89  soft-tissue]
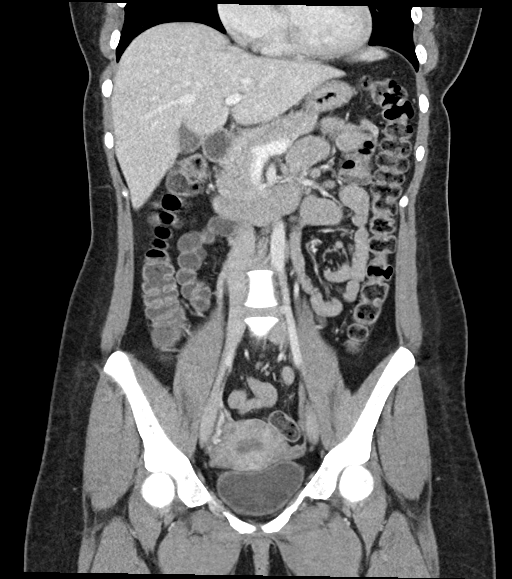
[im 49/89  soft-tissue]
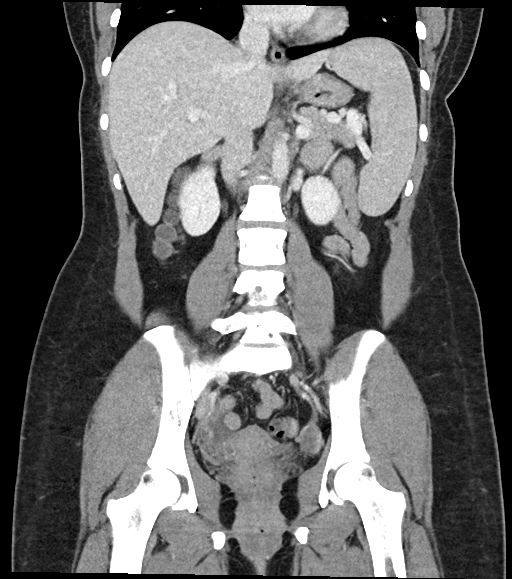

[16 of 46 positions shown; findings below may reference images not displayed]

RADIATION DOSE REDUCTION: This exam was performed according to the
departmental dose-optimization program which includes automated
exposure control, adjustment of the mA and/or kV according to
patient size and/or use of iterative reconstruction technique.

CONTRAST:  100mL OMNIPAQUE IOHEXOL 300 MG/ML  SOLN
FINDINGS: Lower chest: No acute abnormality.

Hepatobiliary: No focal liver abnormality is seen. No gallstones,
gallbladder wall thickening, or biliary dilatation.

Pancreas: Unremarkable. No pancreatic ductal dilatation or
surrounding inflammatory changes.

Spleen: The spleen measures 14.7 by 7.5 x 6.5 cm (volume = 380
cm^3)

Adrenals/Urinary Tract: Normal appearance of the left kidney.
Right-sided pelvocaliectasis and proximal hydroureter identified. No
right-sided ureteral calculi identified. Urinary bladder is
unremarkable.

Stomach/Bowel: Stomach appears within normal limits. The appendix is
difficult to visualize separate from the surrounding right lower
quadrant bowel loops. No bowel wall thickening, inflammation, or
distension.

Vascular/Lymphatic: No significant vascular findings are present. No
enlarged abdominal or pelvic lymph nodes.

Reproductive: The scratch set normal physiologic appearance of the
uterus and or ovaries. Benign corpus luteum identified within the
right ovary. Small calcification is noted in the left ovary.

Other: There is a small volume of identified mildly complex free
fluid within the cul-de-sac. This measures 15 Hounsfield units. No
discrete fluid collections to suggest abscess.

Musculoskeletal: No acute or significant osseous findings.
IMPRESSION: 1. There is a small volume of mildly complex free fluid within the
cul-de-sac which measures 15 Hounsfield units. This is nonspecific
and may be physiologic in a premenopausal female. This may also be
seen in the setting of ruptured ovarian cyst. If further imaging is
clinically indicated consider pelvic sonogram.
2. Right-sided pelvocaliectasis and mild proximal hydroureter. No
right-sided ureteral calculi identified. Findings may reflect
sequelae of recently passed stone.
3. The appendix is difficult to visualize separate from the
surrounding right lower quadrant bowel loops.

## 2022-07-31 DIAGNOSIS — H5340 Unspecified visual field defects: Secondary | ICD-10-CM | POA: Diagnosis not present

## 2022-07-31 DIAGNOSIS — G932 Benign intracranial hypertension: Secondary | ICD-10-CM | POA: Diagnosis not present

## 2022-07-31 DIAGNOSIS — R519 Headache, unspecified: Secondary | ICD-10-CM | POA: Diagnosis not present

## 2022-07-31 DIAGNOSIS — H9313 Tinnitus, bilateral: Secondary | ICD-10-CM | POA: Diagnosis not present

## 2022-07-31 DIAGNOSIS — R413 Other amnesia: Secondary | ICD-10-CM | POA: Diagnosis not present

## 2022-08-05 DIAGNOSIS — H6983 Other specified disorders of Eustachian tube, bilateral: Secondary | ICD-10-CM | POA: Diagnosis not present

## 2022-08-11 DIAGNOSIS — R350 Frequency of micturition: Secondary | ICD-10-CM | POA: Diagnosis not present

## 2022-08-11 DIAGNOSIS — R300 Dysuria: Secondary | ICD-10-CM | POA: Diagnosis not present

## 2022-08-11 DIAGNOSIS — R8290 Unspecified abnormal findings in urine: Secondary | ICD-10-CM | POA: Diagnosis not present

## 2022-08-11 DIAGNOSIS — Z681 Body mass index (BMI) 19 or less, adult: Secondary | ICD-10-CM | POA: Diagnosis not present

## 2022-09-17 DIAGNOSIS — H9201 Otalgia, right ear: Secondary | ICD-10-CM | POA: Diagnosis not present

## 2022-09-17 DIAGNOSIS — Z6831 Body mass index (BMI) 31.0-31.9, adult: Secondary | ICD-10-CM | POA: Diagnosis not present

## 2022-09-21 DIAGNOSIS — H66002 Acute suppurative otitis media without spontaneous rupture of ear drum, left ear: Secondary | ICD-10-CM | POA: Diagnosis not present

## 2022-09-21 DIAGNOSIS — Z6831 Body mass index (BMI) 31.0-31.9, adult: Secondary | ICD-10-CM | POA: Diagnosis not present

## 2022-10-10 DIAGNOSIS — Z79899 Other long term (current) drug therapy: Secondary | ICD-10-CM | POA: Diagnosis not present

## 2022-10-10 DIAGNOSIS — M359 Systemic involvement of connective tissue, unspecified: Secondary | ICD-10-CM | POA: Diagnosis not present

## 2022-12-25 ENCOUNTER — Encounter (HOSPITAL_COMMUNITY): Payer: Self-pay | Admitting: Emergency Medicine

## 2022-12-25 ENCOUNTER — Other Ambulatory Visit: Payer: Self-pay

## 2022-12-25 ENCOUNTER — Emergency Department (HOSPITAL_COMMUNITY): Payer: 59

## 2022-12-25 ENCOUNTER — Emergency Department (HOSPITAL_COMMUNITY)
Admission: EM | Admit: 2022-12-25 | Discharge: 2022-12-25 | Disposition: A | Discharge status: Home / Self Care | Payer: 59 | Attending: Emergency Medicine | Admitting: Emergency Medicine

## 2022-12-25 DIAGNOSIS — Z91040 Latex allergy status: Secondary | ICD-10-CM | POA: Diagnosis not present

## 2022-12-25 DIAGNOSIS — R1032 Left lower quadrant pain: Secondary | ICD-10-CM | POA: Insufficient documentation

## 2022-12-25 DIAGNOSIS — N8312 Corpus luteum cyst of left ovary: Secondary | ICD-10-CM | POA: Diagnosis not present

## 2022-12-25 DIAGNOSIS — R110 Nausea: Secondary | ICD-10-CM | POA: Diagnosis not present

## 2022-12-25 DIAGNOSIS — R42 Dizziness and giddiness: Secondary | ICD-10-CM | POA: Diagnosis not present

## 2022-12-25 LAB — URINALYSIS, ROUTINE W REFLEX MICROSCOPIC
Bilirubin Urine: NEGATIVE
Glucose, UA: NEGATIVE mg/dL
Hgb urine dipstick: NEGATIVE
Ketones, ur: NEGATIVE mg/dL
Leukocytes,Ua: NEGATIVE
Nitrite: NEGATIVE
Protein, ur: NEGATIVE mg/dL
Specific Gravity, Urine: 1.018 (ref 1.005–1.030)
pH: 7 (ref 5.0–8.0)

## 2022-12-25 LAB — COMPREHENSIVE METABOLIC PANEL
ALT: 27 U/L (ref 0–44)
AST: 24 U/L (ref 15–41)
Albumin: 4 g/dL (ref 3.5–5.0)
Alkaline Phosphatase: 57 U/L (ref 38–126)
Anion gap: 8 (ref 5–15)
BUN: 15 mg/dL (ref 6–20)
CO2: 26 mmol/L (ref 22–32)
Calcium: 9 mg/dL (ref 8.9–10.3)
Chloride: 101 mmol/L (ref 98–111)
Creatinine, Ser: 0.74 mg/dL (ref 0.44–1.00)
GFR, Estimated: >60 mL/min (ref >60)
Glucose, Bld: 100 mg/dL — ABNORMAL HIGH (ref 70–99)
Potassium: 4 mmol/L (ref 3.5–5.1)
Sodium: 135 mmol/L (ref 135–145)
Total Bilirubin: 0.5 mg/dL (ref 0.3–1.2)
Total Protein: 7.3 g/dL (ref 6.5–8.1)

## 2022-12-25 LAB — CBC WITH DIFFERENTIAL/PLATELET
Abs Immature Granulocytes: 0 10*3/uL (ref 0.00–0.07)
Basophils Absolute: 0 10*3/uL (ref 0.0–0.1)
Basophils Relative: 1 %
Eosinophils Absolute: 0 10*3/uL (ref 0.0–0.5)
Eosinophils Relative: 1 %
HCT: 41.6 % (ref 36.0–46.0)
Hemoglobin: 13.4 g/dL (ref 12.0–15.0)
Immature Granulocytes: 0 %
Lymphocytes Relative: 31 %
Lymphs Abs: 1.6 10*3/uL (ref 0.7–4.0)
MCH: 28.1 pg (ref 26.0–34.0)
MCHC: 32.2 g/dL (ref 30.0–36.0)
MCV: 87.2 fL (ref 80.0–100.0)
Monocytes Absolute: 0.3 10*3/uL (ref 0.1–1.0)
Monocytes Relative: 7 %
Neutro Abs: 3.2 10*3/uL (ref 1.7–7.7)
Neutrophils Relative %: 60 %
Platelets: 210 10*3/uL (ref 150–400)
RBC: 4.77 MIL/uL (ref 3.87–5.11)
RDW: 13.5 % (ref 11.5–15.5)
WBC: 5.2 10*3/uL (ref 4.0–10.5)
nRBC: 0 % (ref 0.0–0.2)

## 2022-12-25 LAB — LIPASE, BLOOD: Lipase: 33 U/L (ref 11–51)

## 2022-12-25 LAB — PREGNANCY, URINE: Preg Test, Ur: NEGATIVE

## 2022-12-25 MED ORDER — ONDANSETRON 8 MG PO TBDP
8.0000 mg | ORAL_TABLET | Freq: Once | ORAL | Status: AC
  Administered 2022-12-25: 8 mg via ORAL
  Filled 2022-12-25: qty 1

## 2022-12-25 NOTE — Discharge Instructions (Addendum)
Please make an appointment with your primary care provider to be reevaluated regarding recent ER visit and symptoms.  Please monitor your symptoms for your primary care provider and if symptoms worsen please return to ER.

## 2022-12-25 NOTE — ED Triage Notes (Signed)
Pt c/o of LLQ pain x2 days. Also c/o of nausea and dizziness x5 days. No emesis or diarrhea.

## 2022-12-25 NOTE — ED Provider Notes (Cosign Needed Addendum)
[facility] Provider Note   CSN: [id number] Arrival date & time: 12/25/22  1109     History  Chief Complaint  Patient presents with   Abdominal Pain    Lisa Grimes is a 27 y.o. female history of ectopic, ruptured ovarian cyst, urolithiasis presented with left lower quadrant pain has been present for 2 days and dizziness has been present for 5 days.  Patient still able to tolerate food and fluids orally.  Patient states she had the ovarian cyst and kidney stone same time and cannot say whether or not this pain is similar.  Patient states she does not know the triggers for left lower quad pain and has not noticed any vaginal bleeding or urinary symptoms.  Patient stated her last period was end of January and was regular.  Patient states she is getting over a cold.  Patient endorsed nausea.  Patient stated that dizziness occurs randomly without any shortness of breath or chest pain or vision changes and has no association to position or exertion.  Patient endorsed plenty of fluid intake.  Patient denies chest pain, shortness of breath, syncope, new headaches, vision changes, neck pain, fevers, emesis, vaginal bleeding  Home Medications Prior to Admission medications   Medication Sig Start Date End Date Taking? Authorizing Provider  fexofenadine (ALLEGRA) 180 MG tablet Take 180 mg by mouth daily.   Yes [provider]  methotrexate (RHEUMATREX) 2.5 MG tablet Take 10 mg by mouth once a week.   Yes [provider]  metoprolol tartrate (LOPRESSOR) 50 MG tablet Take 1 tablet (50 mg total) by mouth 2 (two) times daily. 11/22/20 12/25/22 Yes Agbor-Etang, Aaron Edelman, MD  nortriptyline (PAMELOR) 10 MG capsule Take 30 mg by mouth at bedtime.   Yes [provider]  famotidine (PEPCID) 20 MG tablet Take 1 tablet (20 mg total) by mouth 2 (two) times daily. 04/28/22 05/28/22  Rada Hay, MD  hydroxychloroquine (PLAQUENIL) 200 MG  tablet Take 1 tablet (200 mg total) by mouth 2 (two) times daily. Patient not taking: Reported on 12/25/2022 12/29/21   Gertie Fey, CNM  metoCLOPramide (REGLAN) 10 MG tablet Take 1 tablet (10 mg total) by mouth every 8 (eight) hours as needed for nausea. Patient not taking: Reported on 12/25/2022 04/03/22   Godfrey Pick, MD  ondansetron (ZOFRAN-ODT) 8 MG disintegrating tablet Take 1 tablet (8 mg total) by mouth every 8 (eight) hours as needed for nausea or vomiting. Patient not taking: Reported on 12/25/2022 04/03/22   Godfrey Pick, MD      Allergies    Amoxicillin, Latex, Penicillins, and Red dye    Review of Systems   Review of Systems  Gastrointestinal:  Positive for abdominal pain.  See HPI  Physical Exam Updated Vital Signs BP 119/73   Pulse 86   Temp 97.8 F (36.6 C) (Oral)   Ht 5' 3"$  (1.6 m)   Wt 83.9 kg   LMP 12/08/2022 (Approximate)   SpO2 100%   Breastfeeding No   BMI 32.77 kg/m  Physical Exam Vitals and nursing note reviewed.  Constitutional:      General: She is not in acute distress.    Appearance: She is well-developed.  HENT:     Head: Normocephalic and atraumatic.  Eyes:     Extraocular Movements: Extraocular movements intact.     Conjunctiva/sclera: Conjunctivae normal.     Pupils: Pupils are equal, round, and reactive to light.  Cardiovascular:  Rate and Rhythm: Normal rate and regular rhythm.     Heart sounds: No murmur heard. Pulmonary:     Effort: Pulmonary effort is normal. No respiratory distress.     Breath sounds: Normal breath sounds.  Abdominal:     Palpations: Abdomen is soft.     Tenderness: There is abdominal tenderness in the left upper quadrant and left lower quadrant. There is guarding. There is no right CVA tenderness or left CVA tenderness.     Hernia: No hernia is present.  Musculoskeletal:        General: No swelling. Normal range of motion.     Cervical back: Normal range of motion and neck supple.  Skin:    General:  Skin is warm and dry.     Capillary Refill: Capillary refill takes less than 2 seconds.  Neurological:     General: No focal deficit present.     Mental Status: She is alert and oriented to person, place, and time.     Cranial Nerves: Cranial nerves 2-12 are intact.     Sensory: Sensation is intact.     Motor: Motor function is intact.     Coordination: Coordination is intact.     Gait: Gait is intact.  Psychiatric:        Mood and Affect: Mood normal.     ED Results / Procedures / Treatments   Labs (all labs ordered are listed, but only abnormal results are displayed) Labs Reviewed  COMPREHENSIVE METABOLIC PANEL - Abnormal; Notable for the following components:      Result Value   Glucose, Bld 100 (*)    All other components within normal limits  CBC WITH DIFFERENTIAL/PLATELET  LIPASE, BLOOD  URINALYSIS, ROUTINE W REFLEX MICROSCOPIC  PREGNANCY, URINE    EKG None  Radiology US Pelvis Complete  Result Date: 12/25/2022 CLINICAL DATA:  Left lower quadrant pain for 2 days with nausea and vomiting EXAM: TRANSABDOMINAL AND TRANSVAGINAL ULTRASOUND OF PELVIS DOPPLER ULTRASOUND OF OVARIES TECHNIQUE: Both transabdominal and transvaginal ultrasound examinations of the pelvis were performed. Transabdominal technique was performed for global imaging of the pelvis including uterus, ovaries, adnexal regions, and pelvic cul-de-sac. It was necessary to proceed with endovaginal exam following the transabdominal exam to visualize the uterus and adnexae in better detail. Color and duplex Doppler ultrasound was utilized to evaluate blood flow to the ovaries. COMPARISON:  04/03/2022 CT with contrast FINDINGS: Uterus Measurements: 7.5 x 3.8 x 4.3 cm = volume: 65 mL. No fibroids or other mass visualized. Endometrium Thickness: 12.0. Trace endometrial cavity and cervical canal fluid, nonspecific. Right ovary Measurements: 3.0 x 2.5 x 2.5 cm = volume: 9.5 mL. Normal appearance/no adnexal mass. Left ovary  Measurements: 3.2 x 2.9 x 2.9 cm = volume: 11.1 mL. Minimally complex slightly thick-walled anechoic cyst compatible with a corpus luteum cyst measuring 2.0 x 1.7 x 1.8 cm. 5 mm benign echogenic calcification in the left ovary noted by ultrasound, also present on the comparison CT. Pulsed Doppler evaluation of both ovaries demonstrates normal low-resistance arterial and venous waveforms. Other findings Small amount of pelvic free fluid. IMPRESSION: 1. 2 cm left ovarian corpus luteum cyst. 2. Small amount of pelvic free fluid. 3. No other acute finding by pelvic ultrasound. Electronically Signed   By: Jerilynn Mages.  Shick M.D.   On: 12/25/2022 14:01   US Transvaginal Non-OB  Result Date: 12/25/2022 CLINICAL DATA:  Left lower quadrant pain for 2 days with nausea and vomiting EXAM: TRANSABDOMINAL AND TRANSVAGINAL ULTRASOUND OF PELVIS  DOPPLER ULTRASOUND OF OVARIES TECHNIQUE: Both transabdominal and transvaginal ultrasound examinations of the pelvis were performed. Transabdominal technique was performed for global imaging of the pelvis including uterus, ovaries, adnexal regions, and pelvic cul-de-sac. It was necessary to proceed with endovaginal exam following the transabdominal exam to visualize the uterus and adnexae in better detail. Color and duplex Doppler ultrasound was utilized to evaluate blood flow to the ovaries. COMPARISON:  04/03/2022 CT with contrast FINDINGS: Uterus Measurements: 7.5 x 3.8 x 4.3 cm = volume: 65 mL. No fibroids or other mass visualized. Endometrium Thickness: 12.0. Trace endometrial cavity and cervical canal fluid, nonspecific. Right ovary Measurements: 3.0 x 2.5 x 2.5 cm = volume: 9.5 mL. Normal appearance/no adnexal mass. Left ovary Measurements: 3.2 x 2.9 x 2.9 cm = volume: 11.1 mL. Minimally complex slightly thick-walled anechoic cyst compatible with a corpus luteum cyst measuring 2.0 x 1.7 x 1.8 cm. 5 mm benign echogenic calcification in the left ovary noted by ultrasound, also present on the  comparison CT. Pulsed Doppler evaluation of both ovaries demonstrates normal low-resistance arterial and venous waveforms. Other findings Small amount of pelvic free fluid. IMPRESSION: 1. 2 cm left ovarian corpus luteum cyst. 2. Small amount of pelvic free fluid. 3. No other acute finding by pelvic ultrasound. Electronically Signed   By: Jerilynn Mages.  Shick M.D.   On: 12/25/2022 14:01   [place] Art/Ven Flow Abd Pelv Doppler  Result Date: 12/25/2022 CLINICAL DATA:  Left lower quadrant pain for 2 days with nausea and vomiting EXAM: TRANSABDOMINAL AND TRANSVAGINAL ULTRASOUND OF PELVIS DOPPLER ULTRASOUND OF OVARIES TECHNIQUE: Both transabdominal and transvaginal ultrasound examinations of the pelvis were performed. Transabdominal technique was performed for global imaging of the pelvis including uterus, ovaries, adnexal regions, and pelvic cul-de-sac. It was necessary to proceed with endovaginal exam following the transabdominal exam to visualize the uterus and adnexae in better detail. Color and duplex Doppler ultrasound was utilized to evaluate blood flow to the ovaries. COMPARISON:  04/03/2022 CT with contrast FINDINGS: Uterus Measurements: 7.5 x 3.8 x 4.3 cm = volume: 65 mL. No fibroids or other mass visualized. Endometrium Thickness: 12.0. Trace endometrial cavity and cervical canal fluid, nonspecific. Right ovary Measurements: 3.0 x 2.5 x 2.5 cm = volume: 9.5 mL. Normal appearance/no adnexal mass. Left ovary Measurements: 3.2 x 2.9 x 2.9 cm = volume: 11.1 mL. Minimally complex slightly thick-walled anechoic cyst compatible with a corpus luteum cyst measuring 2.0 x 1.7 x 1.8 cm. 5 mm benign echogenic calcification in the left ovary noted by ultrasound, also present on the comparison CT. Pulsed Doppler evaluation of both ovaries demonstrates normal low-resistance arterial and venous waveforms. Other findings Small amount of pelvic free fluid. IMPRESSION: 1. 2 cm left ovarian corpus luteum cyst. 2. Small amount of pelvic free  fluid. 3. No other acute finding by pelvic ultrasound. Electronically Signed   By: Jerilynn Mages.  Shick M.D.   On: 12/25/2022 14:01    Procedures Procedures    Medications Ordered in ED Medications  ondansetron (ZOFRAN-ODT) disintegrating tablet 8 mg (8 mg Oral Given 12/25/22 1149)    ED Course/ Medical Decision Making/ A&P                             Medical Decision Making Amount and/or Complexity of Data Reviewed Labs: ordered. Radiology: ordered.  Risk Prescription drug management.   Lisa Grimes 27 y.o. presented today for left lower quadrant pain. Working DDx that I considered at this time  includes, but not limited to, diverticulitis/diverticulosis, ovarian cyst, nephrolithiasis, ectopic, pregnancy.  Review of prior external notes: 04/03/2022 ED provider  Unique Tests and My Interpretation:  Urine pregnancy: Negative UA: Unremarkable Lipase: Unremarkable CMP: Unremarkable CBC with differential: Unremarkable Pelvic ultrasound: Ovarian cyst, no ovarian torsion noted or other abnormalities  Discussion with Independent Historian: None  Discussion of Management of Tests: None  Risk: Low:  - based on diagnostic testing/clinical impression and treatment plan  Risk Stratification Score: None  Staffed with Davonna Belling, MD  R/o DDx: Diverticulitis/diverticulosis: Patient denied any changes in bowel habits and white count was not elevated Nephrolithiasis: UA was negative and patient did not have any CVA tenderness Ovarian torsion: Ultrasound was negative Ectopic: Urine pregnancy was negative and ultrasound negative Pregnancy: Urine pregnancy was negative   Plan: Patient presented for left lower quadrant pain and dizziness.  Patient presented with stable vitals and is not in any distress.  Basic labs and urine will be obtained before considering any advanced imaging.  Patient will be given Zofran for the nausea.  Fluids are not given at this time as patient is tolerating  food and fluids orally and does not appear dehydrated and does not endorse dehydration.  Patient was not dizzy during evaluation and appeared neurologically intact.  Patient had reassuring labs and endorsed improvement in nausea with Zofran.  Patient stable vitals at that time.  Ultrasound will be ordered to rule out ectopic and any other ovarian pathology that may be causing her pain.  Ultrasound was negative for any ovarian torsion.  At this time patient has had reassuring labs and has stable vitals.  I Encouraged patient to follow-up with her primary care provider and monitor symptoms. Patient was given return precautions. patient stable for discharge at this time.  Patient verbalized understanding of plan.         Final Clinical Impression(s) / ED Diagnoses Final diagnoses:  Left lower quadrant abdominal pain    Rx / DC Orders ED Discharge Orders     None         Elvina Sidle 12/25/22 1504    Chuck Hint, PA-C 12/25/22 1509    Davonna Belling, MD 12/26/22 [phone]

## 2022-12-25 NOTE — ED Notes (Signed)
Dr. Alvino Chapel given forms to fill out prior to pt returning to school

## 2023-01-08 DIAGNOSIS — Z6833 Body mass index (BMI) 33.0-33.9, adult: Secondary | ICD-10-CM | POA: Diagnosis not present

## 2023-01-08 DIAGNOSIS — Z03818 Encounter for observation for suspected exposure to other biological agents ruled out: Secondary | ICD-10-CM | POA: Diagnosis not present

## 2023-01-08 DIAGNOSIS — J069 Acute upper respiratory infection, unspecified: Secondary | ICD-10-CM | POA: Diagnosis not present

## 2023-01-08 DIAGNOSIS — R509 Fever, unspecified: Secondary | ICD-10-CM | POA: Diagnosis not present

## 2023-06-09 ENCOUNTER — Other Ambulatory Visit: Payer: Self-pay | Admitting: Neurology

## 2023-06-09 DIAGNOSIS — G932 Benign intracranial hypertension: Secondary | ICD-10-CM

## 2023-06-19 NOTE — Progress Notes (Signed)
Patient for DG Lumbar Puncture on Friday 06/20/23, I called and spoke with the patient on the phone and gave pre-procedure instructions. Pt was made aware to be here at 8:30a. Pt stated understanding.  Called 06/09/2023 and LVM on 06/19/23

## 2023-06-20 ENCOUNTER — Ambulatory Visit
Admission: RE | Admit: 2023-06-20 | Discharge: 2023-06-20 | Disposition: A | Discharge status: Home / Self Care | Payer: Commercial Managed Care - PPO | Source: Ambulatory Visit | Attending: Neurology | Admitting: Neurology

## 2023-06-20 VITALS — BP 120/74 | HR 68 | Temp 97.8°F | Resp 16

## 2023-06-20 DIAGNOSIS — G932 Benign intracranial hypertension: Secondary | ICD-10-CM

## 2023-06-20 DIAGNOSIS — G43109 Migraine with aura, not intractable, without status migrainosus: Secondary | ICD-10-CM | POA: Insufficient documentation

## 2023-06-20 LAB — PREGNANCY, URINE: Preg Test, Ur: NEGATIVE

## 2023-06-20 MED ORDER — ACETAMINOPHEN 325 MG PO TABS
650.0000 mg | ORAL_TABLET | ORAL | Status: DC | PRN
  Administered 2023-06-20: 650 mg via ORAL
  Filled 2023-06-20: qty 2

## 2023-06-20 MED ORDER — ACETAMINOPHEN 325 MG PO TABS
ORAL_TABLET | ORAL | Status: AC
  Filled 2023-06-20: qty 2

## 2023-06-20 MED ORDER — LIDOCAINE 1 % OPTIME INJ - NO CHARGE
10.0000 mL | Freq: Once | INTRAMUSCULAR | Status: AC
  Administered 2023-06-20: 5 mL via INTRADERMAL
  Filled 2023-06-20: qty 10

## 2023-06-20 NOTE — Procedures (Signed)
Technically successful fluoro guided LP at L4-L5 level with opening pressure of 28 cm H2O and closing pressure of 14 H2O 22 cc of clear CSF sent to lab for analysis.  No immediate post procedural complication.  Please see imaging section of Epic for full dictation.    Mina Marble, PA-C 06/20/2023, 10:29 AM

## 2023-09-12 ENCOUNTER — Other Ambulatory Visit: Payer: Self-pay | Admitting: Medical Genetics

## 2023-09-12 DIAGNOSIS — Z006 Encounter for examination for normal comparison and control in clinical research program: Secondary | ICD-10-CM

## 2023-12-27 ENCOUNTER — Encounter (HOSPITAL_COMMUNITY): Payer: Self-pay

## 2023-12-27 ENCOUNTER — Other Ambulatory Visit: Payer: Self-pay

## 2023-12-27 ENCOUNTER — Emergency Department (HOSPITAL_COMMUNITY)
Admission: EM | Admit: 2023-12-27 | Discharge: 2023-12-27 | Disposition: A | Discharge status: Home / Self Care | Payer: Commercial Managed Care - PPO

## 2023-12-27 ENCOUNTER — Emergency Department (HOSPITAL_COMMUNITY): Payer: Commercial Managed Care - PPO

## 2023-12-27 DIAGNOSIS — Z91040 Latex allergy status: Secondary | ICD-10-CM | POA: Diagnosis not present

## 2023-12-27 DIAGNOSIS — W1840XA Slipping, tripping and stumbling without falling, unspecified, initial encounter: Secondary | ICD-10-CM | POA: Insufficient documentation

## 2023-12-27 DIAGNOSIS — M25571 Pain in right ankle and joints of right foot: Secondary | ICD-10-CM | POA: Diagnosis present

## 2023-12-27 MED ORDER — OXYCODONE-ACETAMINOPHEN 5-325 MG PO TABS: 1.0000 | ORAL_TABLET | Freq: Four times a day (QID) | ORAL | 0 refills | Status: DC | PRN

## 2023-12-27 MED ORDER — OXYCODONE-ACETAMINOPHEN 5-325 MG PO TABS
1.0000 | ORAL_TABLET | Freq: Four times a day (QID) | ORAL | 0 refills | Status: DC | PRN
Start: 2023-12-27 — End: 2024-06-23

## 2023-12-27 NOTE — ED Provider Notes (Signed)
[place] [facility]ERGENCY DEPARTMENT AT [facility] Provider Note   CSN: [id number] Arrival date & time: 12/27/23  2024     History  Chief Complaint  Patient presents with   Ankle Pain    Lisa Grimes is a 28 y.o. female.  28 year old female with past medical history of connective tissue disease presents the emergency department today with right ankle pain.  The patient tripped while going down the stairs and her foot bent backwards.  She is having pain in her foot and ankle since then.  She denies any other injuries.  Not hit her head or lose consciousness.  She denies any pain in the rest of her leg.  She came into the ER for further evaluation due to the symptoms.  She denies any focal weakness, numbness, or tingling.   Ankle Pain      Home Medications Prior to Admission medications   Medication Sig Start Date End Date Taking? Authorizing Provider  oxyCODONE-acetaminophen (PERCOCET/ROXICET) 5-325 MG tablet Take 1 tablet by mouth every 6 (six) hours as needed for severe pain (pain score 7-10). 12/27/23  Yes Durwin Glaze, MD  famotidine (PEPCID) 20 MG tablet Take 1 tablet (20 mg total) by mouth 2 (two) times daily. 04/28/22 05/28/22  Georga Hacking, MD  fexofenadine (ALLEGRA) 180 MG tablet Take 180 mg by mouth daily.    [provider]  hydroxychloroquine (PLAQUENIL) 200 MG tablet Take 1 tablet (200 mg total) by mouth 2 (two) times daily. Patient not taking: Reported on 12/25/2022 12/29/21   Janyce Llanos, CNM  methotrexate (RHEUMATREX) 2.5 MG tablet Take 10 mg by mouth once a week.    [provider]  metoCLOPramide (REGLAN) 10 MG tablet Take 1 tablet (10 mg total) by mouth every 8 (eight) hours as needed for nausea. Patient not taking: Reported on 12/25/2022 04/03/22   Gloris Manchester, MD  metoprolol tartrate (LOPRESSOR) 50 MG tablet Take 1 tablet (50 mg total) by mouth 2 (two) times daily. 11/22/20 12/25/22  Debbe Odea, MD  nortriptyline  (PAMELOR) 10 MG capsule Take 30 mg by mouth at bedtime.    [provider]  ondansetron (ZOFRAN-ODT) 8 MG disintegrating tablet Take 1 tablet (8 mg total) by mouth every 8 (eight) hours as needed for nausea or vomiting. Patient not taking: Reported on 12/25/2022 04/03/22   Gloris Manchester, MD      Allergies    Amoxicillin, Latex, Penicillins, and Red dye #40 (allura red)    Review of Systems   Review of Systems  Musculoskeletal:        Ankle pain, foot pain  All other systems reviewed and are negative.   Physical Exam Updated Vital Signs BP (!) 137/96   Pulse 87   Temp 98.5 F (36.9 C) (Oral)   Ht 5\' 4"  (1.626 m)   Wt 96.2 kg   SpO2 100%   BMI 36.39 kg/m  Physical Exam Vitals and nursing note reviewed.   Gen: NAD Eyes: PERRL, EOMI HEENT: no oropharyngeal swelling Neck: trachea midline Resp: clear to auscultation bilaterally Card: RRR, no murmurs, rubs, or gallops Abd: nontender, nondistended Extremities: The upper extremities are atraumatic, the bilateral lower extremities are atraumatic with exception of swelling around the right ankle, the patient is tender over the lateral malleolus as well as over the base of the foot with swelling noted.  Compartments are soft. Vascular: 2+ radial pulses bilaterally, 2+ DP pulses bilaterally Neuro: Patient has equal strength and sensation throughout bilateral lower  extremities Skin: no rashes Psyc: acting appropriately   ED Results / Procedures / Treatments   Labs (all labs ordered are listed, but only abnormal results are displayed) Labs Reviewed - No data to display  EKG None  Radiology DG Ankle 2 Views Right Result Date: 12/27/2023 CLINICAL DATA:  Fall with ankle pain. EXAM: RIGHT ANKLE - 2 VIEW COMPARISON:  None Available. FINDINGS: There is no evidence of fracture, dislocation, or joint effusion. A plantar calcaneal enthesophyte is noted. There is soft tissue swelling around the ankle. IMPRESSION: No acute osseous  injury. Electronically Signed   By: Romona Curls M.D.   On: 12/27/2023 21:08    Procedures Procedures    Medications Ordered in ED Medications - No data to display  ED Course/ Medical Decision Making/ A&P                                 Medical Decision Making 28 year old female with past medical history of connective tissue disease presenting to the emergency department today with ankle pain after a mechanical fall earlier this evening.  I will obtain an x-ray of the foot and ankle for further evaluation for acute fracture.  This may be due to sprain.  Given her history of connective tissue disease I will likely immobilize the patient and have her send out on crutches.  She does not have any findings on exam consistent with compartment syndrome at this time and her neurovascular exam is intact.  Will give her Percocet for pain and reevaluate for ultimate disposition.  Consideration for Lisfranc injury is also noted especially given her issues with connective tissue disease.  I will mobilize the patient and have her follow-up with orthopedics and will defer further imaging to them as an outpatient.  The patient's x-ray is negative.  She is placed in a splint and is discharged with return precautions.  Amount and/or Complexity of Data Reviewed Radiology: ordered.           Final Clinical Impression(s) / ED Diagnoses Final diagnoses:  Acute right ankle pain    Rx / DC Orders ED Discharge Orders          Ordered    oxyCODONE-acetaminophen (PERCOCET/ROXICET) 5-325 MG tablet  Every 6 hours PRN        12/27/23 2125              Durwin Glaze, MD 12/27/23 2126

## 2023-12-27 NOTE — ED Triage Notes (Signed)
Pt stated that she was walking down steps and missed a step and "bent her foot too far back up under her". Pt is complaining of swelling and pain to right ankle. Pt ambulatory to check in but in visible discomfort

## 2023-12-27 NOTE — Discharge Instructions (Addendum)
Your x-ray did not show any fractures.  Please keep on the splint and use the crutches and avoid bearing weight on your foot/ankle and schedule a follow-up appointment with the orthopedist at the number provided.  Take 650 mg of Tylenol every 6 hours.  If you are still having pain is okay to take the Percocet.  Just make sure to take less than 4000 mg of Percocet per day.  Please return to the ER for worsening symptoms.

## 2023-12-30 MED FILL — Oxycodone w/ Acetaminophen Tab 5-325 MG: ORAL | Qty: 6 | Status: AC

## 2024-01-10 HISTORY — PX: OTHER SURGICAL HISTORY: SHX169

## 2024-01-13 ENCOUNTER — Ambulatory Visit (HOSPITAL_COMMUNITY)
Admission: RE | Admit: 2024-01-13 | Discharge: 2024-01-13 | Disposition: A | Discharge status: Home / Self Care | Source: Ambulatory Visit | Attending: Orthopaedic Surgery | Admitting: Orthopaedic Surgery

## 2024-01-13 ENCOUNTER — Other Ambulatory Visit (HOSPITAL_COMMUNITY): Payer: Self-pay | Admitting: Orthopaedic Surgery

## 2024-01-13 DIAGNOSIS — S93401A Sprain of unspecified ligament of right ankle, initial encounter: Secondary | ICD-10-CM

## 2024-01-30 NOTE — Progress Notes (Signed)
   Patient ID: Sicilia Poblano is a 28 y.o. female    Reason for visit: s/p right tvs sinus stent placement   Kenidi Woessner is a 28 y.o. female presents for neurosurgical re-evaluation. She is s/p right transverse sinus stent placement with Dr. Fargen on 01/14/24. She was discharged taking Aspirin and Plavix.   Today she reports overall her headaches are little bit better. She rates her current headache as 2/10. She reports her bilateral tinnitus is worse/louder. She did have papilledema prior to the stent and thinks the R eye seems clearer (was blurry initially but has cleared). She denies nausea and brain fog. She is taking Aspirin and Plavix without issue. Her femoral access site was very bruised after procedure but it is improved. She is no longer requiring pain medications. She is unaccompanied.     Review of Systems  HENT:  Positive for tinnitus.   Eyes:  Positive for visual disturbance.  Neurological:  Positive for headaches.  Hematological:  Bruises/bleeds easily.     All other systems were reviewed and were negative unless otherwise stated in the HPI.     Physical Exam Constitutional:      Appearance: Normal appearance. She is normal weight.  HENT:     Head: Normocephalic.     Mouth/Throat:     Mouth: Mucous membranes are moist.  Eyes:     Extraocular Movements: Extraocular movements intact.  Pulmonary:     Effort: Pulmonary effort is normal.  Musculoskeletal:        General: Normal range of motion.  Skin:    General: Skin is warm and dry.  Neurological:     General: No focal deficit present.     Mental Status: She is alert and oriented to person, place, and time. Mental status is at baseline.     Cranial Nerves: Cranial nerves 2-12 are intact.     Sensory: Sensation is intact.     Motor: Motor function is intact.     Gait: Gait is intact.  Psychiatric:        Mood and Affect: Mood normal.        Behavior: Behavior normal.        Thought Content: Thought content  normal.        Judgment: Judgment normal.    BP 125/65 (BP Location: Right arm, Patient Position: Sitting)   Pulse 90   Temp 98.7 F (37.1 C) (Tympanic)   Ht 1.6 m (5' 3)   Wt 97.2 kg (214 lb 4 oz)   LMP 01/21/2024   SpO2 100%   BMI 37.95 kg/m    ASSESSMENT/PLAN:  Pt with hx of IIH and papilledema who is s/p right tvs sinus stent placement. She has noticed some improvement in headaches and change in R eye vision. She reports tinnitus bilaterally is louder. She will continue to take Aspirin and Plavix and follow up with Dr. Fargen in 6-8 weeks. We discussed that hopefully her symptoms will continue to improve. She verbalized understanding of this plan and had no questions.   I spent 30 minutes in face-to-face and non-face-to-face activities for this patient on the day of the visit. Professional time spent includes the following activities: reviewing previous medical and surgical records, evaluating the patient's current symptoms/allergies/medications, completing the physical examination, independently reviewing imaging, reviewing imaging with the patient in clinic and documenting in the electronic medical record.

## 2024-04-12 NOTE — Progress Notes (Signed)
 This is a shared visit with Dr. Kyle Fargen.    Patient ID: Lisa Grimes is a 28 y.o. female    Reason for visit: s/p right tvs sinus stent placement    Lisa Grimes is a 28 y.o. female presents for neurosurgical re-evaluation. She is s/p right transverse sinus stent placement with Dr. Fargen on 01/14/24. She was discharged taking Aspirin and Plavix.     At her last visit on 02/05/2024, she reported overall her headaches were a little bit better.  She rated her current headache as 2/10.  She reported her bilateral tinnitus was worse/louder.  She endorsed papilledema prior to the stent and thought the right eye seemed clear (was blurry initially but has cleared).  She denied nausea and brain fog.  She was taking aspirin and Plavix without issue.  Her femoral access site was very bruised after the procedure but was improved.  She was no longer requiring pain medications.  She was unaccompanied.  Today she reports resolution of pain where the stent was placed.  She says overall her frequency of headaches has improved since the stent.  She reports 3-4 headaches a month closer to her menstrual cycle.  She is taking aspirin and Plavix. Not currently taking Topamax.  She reports some issues with memory but this is not new.  She feels her vision is still blurry on the left side.  She continues to have bilateral tinnitus.  Denies hemibody weakness/numbness. She is accompanied by her husband.   Review of Systems  HENT:  Positive for tinnitus.   Eyes:  Positive for visual disturbance.  Neurological:  Positive for headaches. Negative for weakness and numbness.     All other systems were reviewed and were negative unless otherwise stated in the HPI.     Physical Exam Vitals reviewed.  Constitutional:      Appearance: Normal appearance.  HENT:     Head: Normocephalic.     Nose: Nose normal.  Pulmonary:     Effort: Pulmonary effort is normal.  Skin:    General: Skin is warm and dry.   Neurological:     Mental Status: She is alert.     Cranial Nerves: Cranial nerves 2-12 are intact.     Sensory: Sensation is intact.     Motor: Motor function is intact.     Coordination: Finger-Nose-Finger Test normal.      Electronically signed by:  Powell Gentry, NP, 04/13/2024 12:38 PM I spent a total of 15 minutes in face-to-face and non-face-to-face activities for this patient on the day of the visit. Professional time spent includes the following activities: reviewing previous medical and surgical records, evaluating the patient's current symptoms/allergies/medications, completing the physical examination, ordering tests and medications and documenting in the electronic medical record. This was independent of the time spent by the physician.   I have personally seen and examined the patient. I agree with Delon Aldridge's history and physical examination.  ASSESSMENT AND PLAN: The patient is a pleasant 42F s/p venous sinus stenting for idiopathic intracranial hypertension about 3 months ago. The patient is doing OK with 30-40% improvement in symptoms. She is interested in getting pregnant again. We discussed the risks of IIH and natural history during pregnancy. I have recommended discontinuing aspirin and Plavix if this is the case. I have encouraged ongoing efforts at weight loss/healthy behaviors. Should symptoms recur/worsen in the future, I would be happy to see her back to discuss options, which could likely involve obtaining a repeat lumbar  puncture to evaluate opening pressure or CT venogram. Otherwise no further scheduled follow-up is necessary.  Thank you for allowing us  to participate in the care of this patient. Please do not hesitate to call our office with questions or concerns.  I have personally spent 12 minutes involved in face-to-face and non-face-to-face activities for this patient on the day of the visit. Professional time spent includes the following activities, in  addition to those noted in the documentation: personal review and interpretation of imaging, evaluating the patient, discussing treatment options, developing a treatment plan, coordinating the plan, and documenting in the electronic medical record.    Rockey HERO. Glean, MD, MPH, [facility] Associate Professor Department of Neurological Surgery

## 2024-06-23 ENCOUNTER — Inpatient Hospital Stay (HOSPITAL_COMMUNITY)

## 2024-06-23 ENCOUNTER — Inpatient Hospital Stay (HOSPITAL_COMMUNITY)
Admission: AD | Admit: 2024-06-23 | Discharge: 2024-06-23 | Disposition: A | Discharge status: Home / Self Care | Attending: Obstetrics & Gynecology | Admitting: Obstetrics & Gynecology

## 2024-06-23 ENCOUNTER — Encounter (HOSPITAL_COMMUNITY): Payer: Self-pay | Admitting: Obstetrics & Gynecology

## 2024-06-23 DIAGNOSIS — O09291 Supervision of pregnancy with other poor reproductive or obstetric history, first trimester: Secondary | ICD-10-CM | POA: Insufficient documentation

## 2024-06-23 DIAGNOSIS — R1032 Left lower quadrant pain: Secondary | ICD-10-CM | POA: Insufficient documentation

## 2024-06-23 DIAGNOSIS — Z113 Encounter for screening for infections with a predominantly sexual mode of transmission: Secondary | ICD-10-CM | POA: Diagnosis present

## 2024-06-23 DIAGNOSIS — O26899 Other specified pregnancy related conditions, unspecified trimester: Secondary | ICD-10-CM

## 2024-06-23 DIAGNOSIS — O26891 Other specified pregnancy related conditions, first trimester: Secondary | ICD-10-CM | POA: Diagnosis not present

## 2024-06-23 DIAGNOSIS — Z3A01 Less than 8 weeks gestation of pregnancy: Secondary | ICD-10-CM | POA: Diagnosis not present

## 2024-06-23 DIAGNOSIS — O208 Other hemorrhage in early pregnancy: Secondary | ICD-10-CM | POA: Insufficient documentation

## 2024-06-23 DIAGNOSIS — R110 Nausea: Secondary | ICD-10-CM | POA: Diagnosis present

## 2024-06-23 DIAGNOSIS — R109 Unspecified abdominal pain: Secondary | ICD-10-CM

## 2024-06-23 LAB — CBC
HCT: 38.1 % (ref 36.0–46.0)
Hemoglobin: 12.6 g/dL (ref 12.0–15.0)
MCH: 27.9 pg (ref 26.0–34.0)
MCHC: 33.1 g/dL (ref 30.0–36.0)
MCV: 84.5 fL (ref 80.0–100.0)
Platelets: 171 K/uL (ref 150–400)
RBC: 4.51 MIL/uL (ref 3.87–5.11)
RDW: 12.6 % (ref 11.5–15.5)
WBC: 6.6 K/uL (ref 4.0–10.5)
nRBC: 0 % (ref 0.0–0.2)

## 2024-06-23 LAB — URINALYSIS, ROUTINE W REFLEX MICROSCOPIC
Bilirubin Urine: NEGATIVE
Glucose, UA: NEGATIVE mg/dL
Hgb urine dipstick: NEGATIVE
Ketones, ur: 5 mg/dL — AB
Nitrite: NEGATIVE
Protein, ur: NEGATIVE mg/dL
Specific Gravity, Urine: 1.027 (ref 1.005–1.030)
pH: 5 (ref 5.0–8.0)

## 2024-06-23 LAB — POCT PREGNANCY, URINE: Preg Test, Ur: POSITIVE — AB

## 2024-06-23 LAB — COMPREHENSIVE METABOLIC PANEL WITH GFR
ALT: 15 U/L (ref 0–44)
AST: 24 U/L (ref 15–41)
Albumin: 3.8 g/dL (ref 3.5–5.0)
Alkaline Phosphatase: 41 U/L (ref 38–126)
Anion gap: 8 (ref 5–15)
BUN: 11 mg/dL (ref 6–20)
CO2: 21 mmol/L — ABNORMAL LOW (ref 22–32)
Calcium: 8.9 mg/dL (ref 8.9–10.3)
Chloride: 108 mmol/L (ref 98–111)
Creatinine, Ser: 0.79 mg/dL (ref 0.44–1.00)
GFR, Estimated: >60 mL/min (ref >60)
Glucose, Bld: 115 mg/dL — ABNORMAL HIGH (ref 70–99)
Potassium: 3.4 mmol/L — ABNORMAL LOW (ref 3.5–5.1)
Sodium: 137 mmol/L (ref 135–145)
Total Bilirubin: 0.6 mg/dL (ref 0.0–1.2)
Total Protein: 6.8 g/dL (ref 6.5–8.1)

## 2024-06-23 LAB — WET PREP, GENITAL
Clue Cells Wet Prep HPF POC: NONE SEEN
Sperm: NONE SEEN
Trich, Wet Prep: NONE SEEN
WBC, Wet Prep HPF POC: >=10 — AB (ref <10)
Yeast Wet Prep HPF POC: NONE SEEN

## 2024-06-23 LAB — HCG, QUANTITATIVE, PREGNANCY: hCG, Beta Chain, Quant, S: 1409 m[IU]/mL — ABNORMAL HIGH (ref <5)

## 2024-06-23 MED ORDER — ONDANSETRON 4 MG PO TBDP
8.0000 mg | ORAL_TABLET | Freq: Once | ORAL | Status: AC
  Administered 2024-06-23 (×2): 8 mg via ORAL
  Filled 2024-06-23: qty 2

## 2024-06-23 MED ORDER — ACETAMINOPHEN 500 MG PO TABS
1000.0000 mg | ORAL_TABLET | Freq: Once | ORAL | Status: AC
Start: 2024-06-23 — End: 2024-06-23
  Administered 2024-06-23 (×2): 1000 mg via ORAL
  Filled 2024-06-23: qty 2

## 2024-06-23 NOTE — MAU Provider Note (Signed)
 History     CSN: [id number]  Arrival date and time: 06/23/24 8076   Event Date/Time   First Provider Initiated Contact with Patient 06/23/24 2109      Chief Complaint  Patient presents with   Abdominal Pain   Nausea    Lisa Grimes is a 28 y.o. G3P0101 at [redacted]w[redacted]d by estimated LMP of May 21, 2024 who has received care at [facility], but is unsure if she will go there for [facility].  She presents today for abdominal pain.  She states the pain started yesterday evening and is constant with periods of increased intensity.  She rates the pain a 7-8/10.  She states it has no relieving or aggravating factors.   She reports eating 3 hours ago: chicken salad sandwich and drank diet mountain dew as well.   OB History     Gravida  3   Para  1   Term      Preterm  1   AB      Living  1      SAB      IAB      Ectopic      Multiple  0   Live Births  1           Past Medical History:  Diagnosis Date   Abnormal white blood cell count    Acute bronchitis    Anemia    per patient    Anxiety    Chlamydia 2016   Depression with anxiety    Dye allergic reaction    Epistaxis    Fatigue    Gastroesophageal reflux disease    Hypoglycemia    IBS (irritable bowel syndrome)    per patient    Influenza A    Insomnia    Major depressive disorder, recurrent episode, moderate with anxious distress (HCC)    Menstrual headache    Migraine headache    Mild major depression (HCC)    Palpitations    Panic disorder with agoraphobia and severe panic attacks    Parent-child relational problem    Rhinitis, allergic    Screening for depression    Sinus tachycardia    Viral syndrome    Vitamin D deficiency    per patient     Past Surgical History:  Procedure Laterality Date   EYE SURGERY     MOUTH SURGERY  2010   STRABISMUS SURGERY  1997   TONSILLECTOMY  2003   tubes in ear  2000    Family History  Problem Relation Age of Onset   Depression Mother    Anxiety  disorder Mother    Hypertension Mother    Alcohol abuse Father    Hypertension Father    Kidney disease Father    Bipolar disorder Maternal Aunt    Cancer Maternal Aunt        breast   Hypertension Maternal Aunt    Diabetes Maternal Aunt    Bipolar disorder Maternal Uncle    Hypertension Maternal Uncle    Bipolar disorder Paternal Uncle    Depression Maternal Grandmother    Stroke Maternal Grandmother    Hypertension Maternal Grandmother    Cancer Maternal Grandmother        breast   Heart disease Maternal Grandmother    Depression Paternal Grandmother    Bipolar disorder Paternal Grandmother    Kidney disease Paternal Grandmother    Fibromyalgia Paternal Grandmother    ADD / ADHD Cousin  Depression Cousin    Heart attack Maternal Aunt    Heart disease Maternal Aunt    Hypertension Maternal Aunt    Heart attack Maternal Uncle    Heart disease Maternal Uncle    Diabetes Maternal Uncle    Stroke Maternal Grandfather    Hypertension Maternal Grandfather    Heart disease Maternal Grandfather    Healthy Brother    Healthy Brother    Healthy Daughter     Social History   Tobacco Use   Smoking status: Never   Smokeless tobacco: Never  Vaping Use   Vaping status: Never Used  Substance Use Topics   Alcohol use: No   Drug use: No    Allergies:  Allergies  Allergen Reactions   Amoxicillin Hives    Has patient had a PCN reaction causing immediate rash, facial/tongue/throat swelling, SOB or lightheadedness with hypotension: Yes Has patient had a PCN reaction causing severe rash involving mucus membranes or skin necrosis: No Has patient had a PCN reaction that required hospitalization No Has patient had a PCN reaction occurring within the last 10 years: No If all of the above answers are NO, then may proceed with Cephalosporin use.    Latex    Penicillins Hives    Has patient had a PCN reaction causing immediate rash, facial/tongue/throat swelling, SOB or  lightheadedness with hypotension: Yes Has patient had a PCN reaction causing severe rash involving mucus membranes or skin necrosis: No Has patient had a PCN reaction that required hospitalization No Has patient had a PCN reaction occurring within the last 10 years: no If all of the above answers are NO, then may proceed with Cephalosporin use.    Red Dye #40 (Allura Red)     possible allergy    Medications Prior to Admission  Medication Sig Dispense Refill Last Dose/Taking   famotidine  (PEPCID ) 20 MG tablet Take 1 tablet (20 mg total) by mouth 2 (two) times daily. 60 tablet 0    fexofenadine (ALLEGRA) 180 MG tablet Take 180 mg by mouth daily.      hydroxychloroquine  (PLAQUENIL ) 200 MG tablet Take 1 tablet (200 mg total) by mouth 2 (two) times daily. (Patient not taking: Reported on 12/25/2022)      methotrexate  (RHEUMATREX) 2.5 MG tablet Take 10 mg by mouth once a week.      metoCLOPramide  (REGLAN ) 10 MG tablet Take 1 tablet (10 mg total) by mouth every 8 (eight) hours as needed for nausea. (Patient not taking: Reported on 12/25/2022) 20 tablet 0    metoprolol  tartrate (LOPRESSOR ) 50 MG tablet Take 1 tablet (50 mg total) by mouth 2 (two) times daily. 180 tablet 3    nortriptyline (PAMELOR) 10 MG capsule Take 30 mg by mouth at bedtime.      ondansetron  (ZOFRAN -ODT) 8 MG disintegrating tablet Take 1 tablet (8 mg total) by mouth every 8 (eight) hours as needed for nausea or vomiting. (Patient not taking: Reported on 12/25/2022) 20 tablet 0    oxyCODONE -acetaminophen  (PERCOCET/ROXICET) 5-325 MG tablet Take 1 tablet by mouth every 6 (six) hours as needed for severe pain (pain score 7-10). 6 tablet 0    oxyCODONE -acetaminophen  (PERCOCET/ROXICET) 5-325 MG tablet Take 1 tablet by mouth every 6 (six) hours as needed for severe pain (pain score 7-10). 6 tablet 0     Review of Systems  Gastrointestinal:  Positive for abdominal pain, diarrhea (Loose x 3 yesterday) and nausea (Currently). Negative for  constipation and vomiting.  Genitourinary:  Negative for difficulty  urinating, dyspareunia and dysuria.   Physical Exam   Blood pressure 139/73, pulse (!) 101, temperature 98.6 F (37 C), resp. rate 18, height 5' 3 (1.6 m), weight 98 kg, last menstrual period 05/21/2024.  Physical Exam Vitals reviewed. Exam conducted with a chaperone present Edwardo, Phelbotomist).  Constitutional:      General: She is not in acute distress.    Appearance: Normal appearance. She is well-developed. She is not toxic-appearing.  HENT:     Head: Normocephalic and atraumatic.  Eyes:     Conjunctiva/sclera: Conjunctivae normal.  Cardiovascular:     Rate and Rhythm: Normal rate.  Pulmonary:     Effort: Pulmonary effort is normal. No respiratory distress.  Musculoskeletal:        General: Normal range of motion.     Cervical back: Normal range of motion.  Skin:    General: Skin is warm and dry.  Neurological:     Mental Status: She is alert and oriented to person, place, and time.  Psychiatric:        Mood and Affect: Mood normal.        Behavior: Behavior normal.     MAU Course  Procedures Results for orders placed or performed during the hospital encounter of 06/23/24 (from the past 24 hours)  Pregnancy, urine POC     Status: Abnormal   Collection Time: 06/23/24  8:25 PM  Result Value Ref Range   Preg Test, Ur POSITIVE (A) NEGATIVE  Wet prep, genital     Status: Abnormal   Collection Time: 06/23/24  8:31 PM   Specimen: PATH Cytology Cervicovaginal Ancillary Only  Result Value Ref Range   Yeast Wet Prep HPF POC NONE SEEN NONE SEEN   Trich, Wet Prep NONE SEEN NONE SEEN   Clue Cells Wet Prep HPF POC NONE SEEN NONE SEEN   WBC, Wet Prep HPF POC >=10 (A) <10   Sperm NONE SEEN   Urinalysis, Routine w reflex microscopic -Urine, Clean Catch     Status: Abnormal   Collection Time: 06/23/24  8:31 PM  Result Value Ref Range   Color, Urine YELLOW YELLOW   APPearance CLEAR CLEAR   Specific  Gravity, Urine 1.027 1.005 - 1.030   pH 5.0 5.0 - 8.0   Glucose, UA NEGATIVE NEGATIVE mg/dL   Hgb urine dipstick NEGATIVE NEGATIVE   Bilirubin Urine NEGATIVE NEGATIVE   Ketones, ur 5 (A) NEGATIVE mg/dL   Protein, ur NEGATIVE NEGATIVE mg/dL   Nitrite NEGATIVE NEGATIVE   Leukocytes,Ua MODERATE (A) NEGATIVE   RBC / HPF 0-5 0 - 5 RBC/hpf   WBC, UA 6-10 0 - 5 WBC/hpf   Bacteria, UA RARE (A) NONE SEEN   Squamous Epithelial / HPF 0-5 0 - 5 /HPF   Mucus PRESENT   CBC     Status: None   Collection Time: 06/23/24  9:11 PM  Result Value Ref Range   WBC 6.6 4.0 - 10.5 K/uL   RBC 4.51 3.87 - 5.11 MIL/uL   Hemoglobin 12.6 12.0 - 15.0 g/dL   HCT 61.8 63.9 - 53.9 %   MCV 84.5 80.0 - 100.0 fL   MCH 27.9 26.0 - 34.0 pg   MCHC 33.1 30.0 - 36.0 g/dL   RDW 87.3 88.4 - 84.4 %   Platelets 171 150 - 400 K/uL   nRBC 0.0 0.0 - 0.2 %  hCG, quantitative, pregnancy     Status: Abnormal   Collection Time: 06/23/24  9:11 PM  Result Value Ref  Range   hCG, Beta Chain, Quant, S 1,409 (H) <5 mIU/mL  Comprehensive metabolic panel with GFR     Status: Abnormal   Collection Time: 06/23/24  9:11 PM  Result Value Ref Range   Sodium 137 135 - 145 mmol/L   Potassium 3.4 (L) 3.5 - 5.1 mmol/L   Chloride 108 98 - 111 mmol/L   CO2 21 (L) 22 - 32 mmol/L   Glucose, Bld 115 (H) 70 - 99 mg/dL   BUN 11 6 - 20 mg/dL   Creatinine, Ser 9.20 0.44 - 1.00 mg/dL   Calcium 8.9 8.9 - 89.6 mg/dL   Total Protein 6.8 6.5 - 8.1 g/dL   Albumin 3.8 3.5 - 5.0 g/dL   AST 24 15 - 41 U/L   ALT 15 0 - 44 U/L   Alkaline Phosphatase 41 38 - 126 U/L   Total Bilirubin 0.6 0.0 - 1.2 mg/dL   GFR, Estimated >39 >39 mL/min   Anion gap 8 5 - 15   US  OB LESS THAN 14 WEEKS WITH OB TRANSVAGINAL Result Date: 06/23/2024 CLINICAL DATA:  Pelvic pain, known pregnancy, initial encounter EXAM: OBSTETRIC <14 WK US  AND TRANSVAGINAL OB US  TECHNIQUE: Both transabdominal and transvaginal ultrasound examinations were performed for complete evaluation of the  gestation as well as the maternal uterus, adnexal regions, and pelvic cul-de-sac. Transvaginal technique was performed to assess early pregnancy. COMPARISON:  None Available. FINDINGS: Intrauterine gestational sac: Present Yolk sac:  Absent Embryo:  Absent MSD: 2.7 mm   5 w   0 d Subchorionic hemorrhage: Prominent subchorionic hemorrhage is noted measuring up to 1.9 cm. Maternal uterus/adnexae: Ovaries are within normal limits with the exception of corpus luteum cyst on the left. IMPRESSION: Probable early intrauterine gestational sac, but no yolk sac, fetal pole, or cardiac activity yet visualized. Recommend follow-up quantitative B-HCG levels and follow-up US  in 14 days to assess viability. This recommendation follows SRU consensus guidelines: Diagnostic Criteria for Nonviable Pregnancy Early in the First Trimester. LOISE Diedra PARAS Med 2013; 630:8556-48. Large subchorionic hemorrhage as described. Electronically Signed   By: Oneil Devonshire M.D.   On: 06/23/2024 22:29    MDM Physical Exam Cultures: Wet Prep and GC/CT Labs: UA, UPT, CBC, CMP, hCG, ABO Ultrasound Antiemetic Analgesci Assessment and Plan  28 year old G3P0101 at 4.5 weeks Abdominal Pain  -Reviewed POC with patient. -Exam performed.  -Labs ordered. -Cultures collected by patient.  -Patient offered and accepts tylenol  and zofran . -Send for US  and await results.    Harlene LITTIE Duncans 06/23/2024, 9:09 PM   Reassessment (11:13 PM) -Results as above. -Patient reports improvement in pain and cessation of nausea. -Informed of findings. -Discussed need for repeat US  in 7-10 days. -Patient desires follow up with [facility].  -Order placed for repeat US  Monday 8/25 at 1000.  -Educated on [facility], what to expect including bleeding, risks for miscarriage, and resolution.  -Instructed to avoid sexual activity while bleeding and for 72 hours after resolution.  -Patient states she is familiar as she experienced in previous pregnancy.  -Precautions  reviewed. -Discharged to home in stable condition.  Harlene LITTIE Duncans MSN, CNM Advanced Practice Provider, Center for [employer]

## 2024-06-23 NOTE — MAU Note (Signed)
 Lisa Grimes is a 28 y.o. at Unknown here in MAU reporting: had a positive HPT today. Started having lower abd on LLQ sharp and campy. Denies any vag bleeding or discharge. Worried she may being having an ectopic pregnancy has had one before.   LMP: 05/21/2024 Onset of complaint: last night Pain score: 7-8 Vitals:   06/23/24 2026  BP: 139/73  Pulse: (!) 101  Resp: 18  Temp: 98.6 F (37 C)     FHT: n/a  Lab orders placed from triage: UPT, U/A  wet prep , gc

## 2024-06-23 NOTE — Discharge Instructions (Signed)
   [facility] [employer] for [employer] at [facility]  [address] [place], [place] [address]  [phone]  Center for [facility] Healthcare at [facility]  [address], [place], [place] [address]  [phone]  Center for [facility] Healthcare at [facility] [address] [place], [place] [address]  [phone]  Center for [facility] Healthcare at [facility] [address], [place], [place] [address] [phone]  Center for [facility] Healthcare at [facility]  [address] [place], [place] [address]  [phone]  Center for [facility] Healthcare at [facility]  [address] (First floor), [place], [place] [address]  [phone]  Center for [facility] at [facility]  [address] [place], [place], [place] [address]  [phone]  [facility]  [address], [place], [place] [address]  [phone]  [facility]  [address] [place], [place], [place] [address]  [phone]  [facility]  [address] [place], [place] [address]  [phone]  [facility]  [address] [place], [place] [address]  [phone]  [facility]  [address], [place], [place] [address]  [phone]  [facility]   [address] [place], [place] [address]  [phone]  [facility]  [address] [place], [place] [address]   [phone]  [facility] [address], [place], [place] [address] [phone]  [facility]  [address] [place], [place] [address]  [phone]

## 2024-06-24 LAB — GC/CHLAMYDIA PROBE AMP (~~LOC~~) NOT AT ARMC
Chlamydia: NEGATIVE
Comment: NEGATIVE
Comment: NORMAL
Neisseria Gonorrhea: NEGATIVE

## 2024-07-05 ENCOUNTER — Other Ambulatory Visit

## 2024-08-20 ENCOUNTER — Other Ambulatory Visit: Payer: Self-pay | Admitting: Genetic Counselor

## 2024-08-20 DIAGNOSIS — Z006 Encounter for examination for normal comparison and control in clinical research program: Secondary | ICD-10-CM

## 2024-08-25 ENCOUNTER — Encounter: Payer: Self-pay | Admitting: *Deleted

## 2024-09-02 ENCOUNTER — Other Ambulatory Visit: Payer: Self-pay | Admitting: Obstetrics

## 2024-09-02 DIAGNOSIS — E669 Obesity, unspecified: Secondary | ICD-10-CM

## 2024-09-24 ENCOUNTER — Ambulatory Visit: Payer: Self-pay | Admitting: Cardiology

## 2024-10-08 ENCOUNTER — Inpatient Hospital Stay (HOSPITAL_COMMUNITY)
Admission: AD | Admit: 2024-10-08 | Discharge: 2024-10-09 | Disposition: A | Attending: Obstetrics and Gynecology | Admitting: Obstetrics and Gynecology

## 2024-10-08 DIAGNOSIS — O26892 Other specified pregnancy related conditions, second trimester: Secondary | ICD-10-CM | POA: Insufficient documentation

## 2024-10-08 DIAGNOSIS — O99612 Diseases of the digestive system complicating pregnancy, second trimester: Secondary | ICD-10-CM | POA: Insufficient documentation

## 2024-10-08 DIAGNOSIS — K5901 Slow transit constipation: Secondary | ICD-10-CM | POA: Insufficient documentation

## 2024-10-08 DIAGNOSIS — O26899 Other specified pregnancy related conditions, unspecified trimester: Secondary | ICD-10-CM

## 2024-10-08 DIAGNOSIS — R109 Unspecified abdominal pain: Secondary | ICD-10-CM | POA: Insufficient documentation

## 2024-10-08 DIAGNOSIS — Z3A20 20 weeks gestation of pregnancy: Secondary | ICD-10-CM | POA: Insufficient documentation

## 2024-10-08 DIAGNOSIS — R519 Headache, unspecified: Secondary | ICD-10-CM | POA: Insufficient documentation

## 2024-10-09 DIAGNOSIS — R109 Unspecified abdominal pain: Secondary | ICD-10-CM | POA: Diagnosis not present

## 2024-10-09 DIAGNOSIS — O99612 Diseases of the digestive system complicating pregnancy, second trimester: Secondary | ICD-10-CM | POA: Diagnosis not present

## 2024-10-09 DIAGNOSIS — R519 Headache, unspecified: Secondary | ICD-10-CM | POA: Diagnosis not present

## 2024-10-09 DIAGNOSIS — Z3A20 20 weeks gestation of pregnancy: Secondary | ICD-10-CM | POA: Diagnosis not present

## 2024-10-09 DIAGNOSIS — O26892 Other specified pregnancy related conditions, second trimester: Secondary | ICD-10-CM | POA: Diagnosis present

## 2024-10-09 DIAGNOSIS — K5901 Slow transit constipation: Secondary | ICD-10-CM | POA: Diagnosis not present

## 2024-10-09 LAB — WET PREP, GENITAL
Clue Cells Wet Prep HPF POC: NONE SEEN
Sperm: NONE SEEN
Trich, Wet Prep: NONE SEEN
WBC, Wet Prep HPF POC: <10 (ref <10)
Yeast Wet Prep HPF POC: NONE SEEN

## 2024-10-09 LAB — COMPREHENSIVE METABOLIC PANEL WITH GFR
ALT: 21 U/L (ref 0–44)
AST: 24 U/L (ref 15–41)
Albumin: 2.9 g/dL — ABNORMAL LOW (ref 3.5–5.0)
Alkaline Phosphatase: 48 U/L (ref 38–126)
Anion gap: 9 (ref 5–15)
BUN: 6 mg/dL (ref 6–20)
CO2: 22 mmol/L (ref 22–32)
Calcium: 9 mg/dL (ref 8.9–10.3)
Chloride: 103 mmol/L (ref 98–111)
Creatinine, Ser: 0.57 mg/dL (ref 0.44–1.00)
GFR, Estimated: >60 mL/min (ref >60)
Glucose, Bld: 165 mg/dL — ABNORMAL HIGH (ref 70–99)
Potassium: 3.8 mmol/L (ref 3.5–5.1)
Sodium: 134 mmol/L — ABNORMAL LOW (ref 135–145)
Total Bilirubin: 0.3 mg/dL (ref 0.0–1.2)
Total Protein: 6.3 g/dL — ABNORMAL LOW (ref 6.5–8.1)

## 2024-10-09 LAB — URINALYSIS, ROUTINE W REFLEX MICROSCOPIC
Bilirubin Urine: NEGATIVE
Glucose, UA: NEGATIVE mg/dL
Ketones, ur: NEGATIVE mg/dL
Leukocytes,Ua: NEGATIVE
Nitrite: NEGATIVE
Protein, ur: NEGATIVE mg/dL
Specific Gravity, Urine: 1.01 (ref 1.005–1.030)
pH: 6 (ref 5.0–8.0)

## 2024-10-09 LAB — CBC WITH DIFFERENTIAL/PLATELET
Abs Immature Granulocytes: 0.05 K/uL (ref 0.00–0.07)
Basophils Absolute: 0 K/uL (ref 0.0–0.1)
Basophils Relative: 0 %
Eosinophils Absolute: 0.1 K/uL (ref 0.0–0.5)
Eosinophils Relative: 1 %
HCT: 35.6 % — ABNORMAL LOW (ref 36.0–46.0)
Hemoglobin: 11.9 g/dL — ABNORMAL LOW (ref 12.0–15.0)
Immature Granulocytes: 1 %
Lymphocytes Relative: 17 %
Lymphs Abs: 1.7 K/uL (ref 0.7–4.0)
MCH: 28.5 pg (ref 26.0–34.0)
MCHC: 33.4 g/dL (ref 30.0–36.0)
MCV: 85.4 fL (ref 80.0–100.0)
Monocytes Absolute: 0.6 K/uL (ref 0.1–1.0)
Monocytes Relative: 6 %
Neutro Abs: 7.8 K/uL — ABNORMAL HIGH (ref 1.7–7.7)
Neutrophils Relative %: 75 %
Platelets: 182 K/uL (ref 150–400)
RBC: 4.17 MIL/uL (ref 3.87–5.11)
RDW: 13 % (ref 11.5–15.5)
WBC: 10.3 K/uL (ref 4.0–10.5)
nRBC: 0 % (ref 0.0–0.2)

## 2024-10-09 LAB — URINALYSIS, MICROSCOPIC (REFLEX)

## 2024-10-09 LAB — PROTEIN / CREATININE RATIO, URINE
Creatinine, Urine: 42 mg/dL
Total Protein, Urine: <6 mg/dL

## 2024-10-09 MED ORDER — POLYETHYLENE GLYCOL 3350 17 GM/SCOOP PO POWD: 17.0000 g | Freq: Every day | ORAL | 0 refills | Status: AC

## 2024-10-09 MED ORDER — ONDANSETRON 4 MG PO TBDP
4.0000 mg | ORAL_TABLET | Freq: Once | ORAL | Status: AC
  Administered 2024-10-09: 4 mg via ORAL
  Filled 2024-10-09: qty 1

## 2024-10-09 MED ORDER — ACETAMINOPHEN-CAFFEINE 500-65 MG PO TABS
2.0000 | ORAL_TABLET | Freq: Once | ORAL | Status: AC
  Administered 2024-10-09: 2 via ORAL
  Filled 2024-10-09: qty 2

## 2024-10-09 MED ORDER — METOCLOPRAMIDE HCL 10 MG PO TABS
10.0000 mg | ORAL_TABLET | Freq: Once | ORAL | Status: AC
  Administered 2024-10-09: 10 mg via ORAL
  Filled 2024-10-09: qty 1

## 2024-10-09 MED ORDER — ACETAMINOPHEN 500 MG PO TABS: 1000.0000 mg | ORAL_TABLET | Freq: Once | ORAL | Status: DC

## 2024-10-09 MED ORDER — METOPROLOL TARTRATE 50 MG PO TABS
50.0000 mg | ORAL_TABLET | Freq: Once | ORAL | Status: AC
  Administered 2024-10-09: 50 mg via ORAL
  Filled 2024-10-09: qty 1

## 2024-10-09 MED ORDER — METOPROLOL TARTRATE 50 MG PO TABS: 50.0000 mg | ORAL_TABLET | Freq: Two times a day (BID) | ORAL | 0 refills | Status: AC

## 2024-10-09 NOTE — MAU Note (Signed)
 Pt says has H/A- her entire head  - started today at 6pm- 4/10- took sch ASA.     H/A worse at 830pm- 8/10-  no meds.       Has been nauseated - vomited at 10pm. Took 1/2 dose Lopressor  - says is running out of med and has not called for a refill.  Now- 8/10. In March had a stent placed right side of brain.

## 2024-10-09 NOTE — MAU Note (Cosign Needed)
 Maternal Assessment Unit Provider Note  Subjective: [name] is a 28 y.o. G65P0101 pregnant female with a past medical history of undifferentiated connective tissue disorder, history of migraines, history of preeclampsia with first pregnancy, intracranial hypertension s/p right transverse sinus stent at [redacted]w[redacted]d who presents to MAU today with complaint of headache, nausea/vomiting, and abdominal cramping.   She had a sudden onset of headache at 6 PM this evening.  It is generalized in location, and a constant aching quality.  She also has photophobia and phonophobia as well as nausea and vomiting prior to presentation.  About the same time she started having some abdominal cramping.  She reports some increased frequency of urination, no dysuria, no urgency.  No fevers or chills.  In general she has bowel movements about every other day.  Last bowel movement was today.  Reports she has been getting adequate hydration today.  No medications tried.  She took her scheduled aspirin this evening and she took one half tab of her 50 mg of Lopressor .  Per triage RN report: She is supposed to be on 50 mg of Lopressor  twice daily but is running out and has not yet called the pharmacy for refills which is why she is taking a decreased quantity. She report taking lopressor  for fast heart rate.   No vaginal bleeding, no leaking fluid, feeling fetal movement.  Receives care at [facility]. Prenatal records reviewed.  Pertinent items noted in HPI and remainder of comprehensive ROS otherwise negative.   Objective: BP (!) 133/50 (BP Location: Right Arm)   Pulse 79   Temp 98 F (36.7 C)   Resp 12   Ht 5' 3 (1.6 m)   Wt 98.9 kg   LMP 05/21/2024   BMI 38.62 kg/m  Physical Exam Vitals reviewed.  Constitutional:      General: She is not in acute distress.    Appearance: She is well-developed. She is obese. She is not diaphoretic.  HENT:     Head: Normocephalic and atraumatic.  Eyes:     General:  No scleral icterus.    Extraocular Movements: Extraocular movements intact.  Cardiovascular:     Rate and Rhythm: Normal rate and regular rhythm.  Pulmonary:     Effort: Pulmonary effort is normal. No respiratory distress.     Breath sounds: Normal breath sounds.  Abdominal:     General: There is no distension.     Palpations: Abdomen is soft.     Tenderness: There is abdominal tenderness. There is no guarding or rebound.     Comments: Negative Rovsing's, negative obturator, negative McBurney sign.  Mild diffuse abdominal tenderness to palpation.  Musculoskeletal:     Cervical back: Neck supple.  Skin:    General: Skin is warm and dry.  Neurological:     Mental Status: She is alert.     Coordination: Coordination normal.   Fetal heart tones Doppler: 130 bpm   MDM: Moderate risk  MAU Course:  Time: 0130 Patient's status post Zofran  initial dose.  She is still experiencing mild nausea although somewhat improved.  Abdominal exam is benign.  Headache has migrainous quality.  Initial blood pressure is elevated, though presumably due to not taking medication as prescribed.  Due to running low on her Lopressor .  Will get labs given history of preeclampsia and she is after 20 weeks though preeclampsia is unlikely.  Also urinalysis, and vaginal swabs ordered, she was given Reglan , Excedrin, and Lopressor  50 mg.  0300 Reassessment  Patient feeling better with improved headache and improved nausea. Feels comfortable with discharge home. Reviewed results with patient. Vag swabs negative (GC pending), UA with trace bacteria--will send for culture. Negative pre-eclampsia labs. BP and VS now normal   Questions were answered to the satisfaction of the patient and/or family prior to discharge.   Assessment Medical screening exam complete    ICD-10-CM   1. Pregnancy headache in second trimester  O26.892    R51.9     2. Abdominal cramping affecting pregnancy  O26.899    R10.9     3.  Slow transit constipation  K59.01       Plan #headache -migrainous quality -improved s/p Reglan , Zofran , Excedrin -pre-eclampsia labs negative  #abdominal cramping -benign abdo exam -suspect related to constipation -UA sent for culture -Wet prep negative, GC pending -discharge with miralax prescription  #need for refills -lopressor  refill sent to pharmacy  Discharge from MAU in stable condition with return/labor precautions Follow up at [facility] as scheduled for ongoing prenatal care  Allergies as of 10/09/2024       Reactions   Amoxicillin Hives   Has patient had a PCN reaction causing immediate rash, facial/tongue/throat swelling, SOB or lightheadedness with hypotension: Yes Has patient had a PCN reaction causing severe rash involving mucus membranes or skin necrosis: No Has patient had a PCN reaction that required hospitalization No Has patient had a PCN reaction occurring within the last 10 years: No If all of the above answers are NO, then may proceed with Cephalosporin use.   Latex    Penicillins Hives   Has patient had a PCN reaction causing immediate rash, facial/tongue/throat swelling, SOB or lightheadedness with hypotension: Yes Has patient had a PCN reaction causing severe rash involving mucus membranes or skin necrosis: No Has patient had a PCN reaction that required hospitalization No Has patient had a PCN reaction occurring within the last 10 years: no If all of the above answers are NO, then may proceed with Cephalosporin use.   Red Dye #40 (allura Red)    possible allergy        Medication List     TAKE these medications    aspirin EC 81 MG tablet Take 81 mg by mouth daily. Swallow whole.   metoprolol  tartrate 50 MG tablet Commonly known as: LOPRESSOR  Take 1 tablet (50 mg total) by mouth 2 (two) times daily.   polyethylene glycol powder 17 GM/SCOOP powder Commonly known as: MiraLax Take 17 g by mouth daily. Dissolve 1 capful  (17g) in 4-8 ounces of liquid and take by mouth daily.   prenatal multivitamin Tabs tablet Take 1 tablet by mouth daily at 12 noon.        Trudy Leeroy NOVAK, MD 10/09/2024 3:03 AM

## 2024-10-09 NOTE — Discharge Instructions (Signed)
 Call your OB Clinic or go to [facility] if: You begin to have strong, frequent contractions Your water breaks.  Sometimes it is a big gush of fluid, sometimes it is just a trickle that keeps getting your panties wet or running down your legs You have vaginal bleeding.  It is normal to have a small amount of spotting if your cervix was checked.

## 2024-10-11 LAB — GC/CHLAMYDIA PROBE AMP (~~LOC~~) NOT AT ARMC
Chlamydia: NEGATIVE
Comment: NEGATIVE
Comment: NORMAL
Neisseria Gonorrhea: NEGATIVE

## 2024-10-14 DIAGNOSIS — G932 Benign intracranial hypertension: Secondary | ICD-10-CM | POA: Insufficient documentation

## 2024-10-14 DIAGNOSIS — Z9889 Other specified postprocedural states: Secondary | ICD-10-CM | POA: Insufficient documentation

## 2024-10-14 DIAGNOSIS — O99212 Obesity complicating pregnancy, second trimester: Secondary | ICD-10-CM | POA: Insufficient documentation

## 2024-10-14 DIAGNOSIS — Z8759 Personal history of other complications of pregnancy, childbirth and the puerperium: Secondary | ICD-10-CM | POA: Insufficient documentation

## 2024-10-14 DIAGNOSIS — R000 Tachycardia, unspecified: Secondary | ICD-10-CM | POA: Insufficient documentation

## 2024-10-15 ENCOUNTER — Other Ambulatory Visit: Payer: Self-pay

## 2024-10-15 ENCOUNTER — Ambulatory Visit: Payer: Self-pay

## 2024-10-15 DIAGNOSIS — O99212 Obesity complicating pregnancy, second trimester: Secondary | ICD-10-CM

## 2024-10-15 DIAGNOSIS — Z8759 Personal history of other complications of pregnancy, childbirth and the puerperium: Secondary | ICD-10-CM

## 2024-10-15 DIAGNOSIS — R000 Tachycardia, unspecified: Secondary | ICD-10-CM

## 2024-10-15 DIAGNOSIS — G932 Benign intracranial hypertension: Secondary | ICD-10-CM

## 2024-10-15 DIAGNOSIS — Z9889 Other specified postprocedural states: Secondary | ICD-10-CM

## 2024-10-22 ENCOUNTER — Ambulatory Visit: Payer: Self-pay

## 2024-11-08 ENCOUNTER — Other Ambulatory Visit: Payer: Self-pay | Admitting: *Deleted

## 2024-11-08 ENCOUNTER — Ambulatory Visit: Payer: Self-pay

## 2024-11-08 ENCOUNTER — Ambulatory Visit: Attending: Obstetrics and Gynecology | Admitting: Obstetrics

## 2024-11-08 VITALS — BP 117/56 | HR 74

## 2024-11-08 DIAGNOSIS — Z7982 Long term (current) use of aspirin: Secondary | ICD-10-CM | POA: Insufficient documentation

## 2024-11-08 DIAGNOSIS — Z3A24 24 weeks gestation of pregnancy: Secondary | ICD-10-CM | POA: Diagnosis not present

## 2024-11-08 DIAGNOSIS — Z79899 Other long term (current) drug therapy: Secondary | ICD-10-CM | POA: Diagnosis not present

## 2024-11-08 DIAGNOSIS — O99212 Obesity complicating pregnancy, second trimester: Secondary | ICD-10-CM

## 2024-11-08 DIAGNOSIS — O99352 Diseases of the nervous system complicating pregnancy, second trimester: Secondary | ICD-10-CM | POA: Diagnosis not present

## 2024-11-08 DIAGNOSIS — G932 Benign intracranial hypertension: Secondary | ICD-10-CM | POA: Insufficient documentation

## 2024-11-08 DIAGNOSIS — O169 Unspecified maternal hypertension, unspecified trimester: Secondary | ICD-10-CM | POA: Diagnosis not present

## 2024-11-08 DIAGNOSIS — O09292 Supervision of pregnancy with other poor reproductive or obstetric history, second trimester: Secondary | ICD-10-CM | POA: Diagnosis not present

## 2024-11-08 DIAGNOSIS — Z3689 Encounter for other specified antenatal screening: Secondary | ICD-10-CM | POA: Insufficient documentation

## 2024-11-08 DIAGNOSIS — E669 Obesity, unspecified: Secondary | ICD-10-CM | POA: Diagnosis not present

## 2024-11-08 DIAGNOSIS — Z8759 Personal history of other complications of pregnancy, childbirth and the puerperium: Secondary | ICD-10-CM

## 2024-11-09 NOTE — Progress Notes (Signed)
 MFM Consult Note  Lisa Grimes is currently at [redacted]w[redacted]d. She was seen today for a detailed fetal anatomy scan due to maternal obesity with a BMI of 39 and history of idiopathic intracranial hypertension and venous sinus stenosis.    She had a right transverse sinus stent placed by neurosurgery earlier this year.  The patient reports that her headaches are less following the placement of the stent.  She had been treated with Topamax and Diamox previously with minimal effects.  Her current medications include metoprolol  for treatment of a fast heart rate, daily baby aspirin, and prenatal vitamins.  She denies any problems in her current pregnancy.    She had a cell free DNA test earlier in her pregnancy which indicated a low risk for trisomy 44, 44, and 13. A female fetus is predicted.   Sonographic findings Single intrauterine pregnancy at 24w 3d. Fetal cardiac activity:  Observed and appears normal. Presentation: Cephalic. An echogenic focus was noted in the left ventricle of the fetal heart.  There were no other obvious fetal anomalies noted. Fetal biometry shows the estimated fetal weight of 1 lb 7 oz,  644 grams (21%). Amniotic fluid: Within normal limits.  MVP: 6.66 cm. Placenta: Anterior. Adnexa: No abnormality visualized. Cervical length: 3.6 cm.  The patient was informed that anomalies may be missed due to technical limitations. If the fetus is in a suboptimal position or maternal habitus is increased, visualization of the fetus in the maternal uterus may be impaired.  Echogenic Focus   The small association between an echogenic focus and Down syndrome was discussed.   Due to the echogenic focus noted today, the patient was offered and declined an amniocentesis today for definitive diagnosis of fetal aneuploidy.    She reports that she is comfortable with the low risk indicated by her cell free DNA test.  Idiopathic intracranial hypertension and pregnancy  The patient was  advised to have close follow-up with neurosurgery during her pregnancy.  She reports that her neurosurgeon did not have any specific recommendations for her pregnancy.  She was advised that she may take Topamax or Diamox if necessary during her pregnancy.  She should have an anesthesia consult prior to delivery.  We will continue to follow her with growth ultrasounds throughout her pregnancy.  A follow-up exam was scheduled in 4 weeks.  The patient stated that all of her questions were answered.   A total of 30 minutes was spent counseling and coordinating the care for this patient.  Greater than 50% of the time was spent in direct face-to-face contact.

## 2024-12-07 ENCOUNTER — Ambulatory Visit

## 2024-12-13 ENCOUNTER — Encounter: Admitting: Nutrition

## 2024-12-16 ENCOUNTER — Encounter: Admitting: Nutrition

## 2024-12-16 VITALS — Ht 63.0 in | Wt 222.0 lb

## 2024-12-16 DIAGNOSIS — O24410 Gestational diabetes mellitus in pregnancy, diet controlled: Secondary | ICD-10-CM

## 2024-12-16 NOTE — Progress Notes (Unsigned)
 2 hrs after dinner 116 only took it once yesterday 2/3 101 before breakfast 1-/29 93,1/28 95 before mbreakfas, 2 hours after lunch 134, 108 2 hrs after suppoer  1/28 95, 134, 108

## 2024-12-23 ENCOUNTER — Encounter: Admitting: Nutrition

## 2024-12-24 ENCOUNTER — Ambulatory Visit: Payer: Self-pay | Admitting: Cardiology

## 2024-12-27 ENCOUNTER — Ambulatory Visit
